# Patient Record
Sex: Female | Born: 1965 | Race: Black or African American | Hispanic: No | Marital: Single | Smoking: Never smoker
Health system: Southern US, Community
[De-identification: ages and names within clinical notes are randomized; demographics above are authoritative.]

## PROBLEM LIST (undated history)

## (undated) DIAGNOSIS — J45909 Unspecified asthma, uncomplicated: Secondary | ICD-10-CM

## (undated) DIAGNOSIS — I10 Essential (primary) hypertension: Secondary | ICD-10-CM

## (undated) DIAGNOSIS — D649 Anemia, unspecified: Secondary | ICD-10-CM

## (undated) HISTORY — DX: Anemia, unspecified: D64.9

## (undated) HISTORY — PX: NO PAST SURGERIES: SHX2092

## (undated) HISTORY — DX: Unspecified asthma, uncomplicated: J45.909

## (undated) HISTORY — PX: COLONOSCOPY: SHX174

---

## 2009-10-06 ENCOUNTER — Ambulatory Visit (HOSPITAL_COMMUNITY): Admission: RE | Admit: 2009-10-06 | Discharge: 2009-10-06 | Payer: Self-pay | Admitting: Internal Medicine

## 2010-11-07 ENCOUNTER — Encounter: Payer: Self-pay | Admitting: Internal Medicine

## 2010-12-24 ENCOUNTER — Other Ambulatory Visit (HOSPITAL_COMMUNITY): Payer: Self-pay | Admitting: Internal Medicine

## 2010-12-24 DIAGNOSIS — Z1231 Encounter for screening mammogram for malignant neoplasm of breast: Secondary | ICD-10-CM

## 2011-01-06 ENCOUNTER — Ambulatory Visit (HOSPITAL_COMMUNITY)
Admission: RE | Admit: 2011-01-06 | Discharge: 2011-01-06 | Disposition: A | Discharge status: Home / Self Care | Payer: Self-pay | Source: Ambulatory Visit | Attending: Internal Medicine | Admitting: Internal Medicine

## 2011-01-06 DIAGNOSIS — Z1231 Encounter for screening mammogram for malignant neoplasm of breast: Secondary | ICD-10-CM

## 2012-01-24 ENCOUNTER — Other Ambulatory Visit (HOSPITAL_COMMUNITY): Payer: Self-pay | Admitting: Internal Medicine

## 2012-01-24 DIAGNOSIS — Z1231 Encounter for screening mammogram for malignant neoplasm of breast: Secondary | ICD-10-CM

## 2012-02-17 ENCOUNTER — Ambulatory Visit (HOSPITAL_COMMUNITY)
Admission: RE | Admit: 2012-02-17 | Discharge: 2012-02-17 | Disposition: A | Discharge status: Home / Self Care | Payer: Self-pay | Source: Ambulatory Visit | Attending: Internal Medicine | Admitting: Internal Medicine

## 2012-02-17 DIAGNOSIS — Z1231 Encounter for screening mammogram for malignant neoplasm of breast: Secondary | ICD-10-CM

## 2013-08-06 ENCOUNTER — Ambulatory Visit (INDEPENDENT_AMBULATORY_CARE_PROVIDER_SITE_OTHER): Payer: Self-pay | Admitting: Family

## 2013-08-06 ENCOUNTER — Encounter: Payer: Self-pay | Admitting: Family

## 2013-08-06 VITALS — BP 158/98 | HR 76 | Temp 98.6°F | Resp 16 | Ht 64.5 in | Wt 311.1 lb

## 2013-08-06 DIAGNOSIS — Z6841 Body Mass Index (BMI) 40.0 and over, adult: Secondary | ICD-10-CM

## 2013-08-06 DIAGNOSIS — I10 Essential (primary) hypertension: Secondary | ICD-10-CM

## 2013-08-06 DIAGNOSIS — Z23 Encounter for immunization: Secondary | ICD-10-CM

## 2013-08-06 DIAGNOSIS — K5900 Constipation, unspecified: Secondary | ICD-10-CM

## 2013-08-06 MED ORDER — METOPROLOL TARTRATE 25 MG PO TABS: 25.0000 mg | ORAL_TABLET | Freq: Two times a day (BID) | ORAL | Status: DC

## 2013-08-06 NOTE — Assessment & Plan Note (Signed)
We discussed increasing fresh fruits veggies and water intake.

## 2013-08-06 NOTE — Patient Instructions (Signed)
Please continue your walking. Increase water, fresh fruits, veggies and lean meats in your diet. Start metoprolol. Follow up in 1 month for blood pressure check.  Welcome to [facility]!

## 2013-08-06 NOTE — Assessment & Plan Note (Signed)
We discussed importance of healthy diet, exercise and weight loss.

## 2013-08-06 NOTE — Assessment & Plan Note (Signed)
Deteriorated.  Add metoprolol. Follow up in 1 month.  Plan lab work next visit. Tdap given today along with flu shot.  Plan bmet next visit. She is hopeful that she will be able to obtain insurance in the near future. Form signed for work.

## 2013-08-06 NOTE — Progress Notes (Signed)
  Subjective:    Patient ID: Marissa Bowman, female    DOB: August 06, 1966, 47 y.o.   MRN: [id number]  HPI  [name] is a 46 yr old female who presents today to establish care.   Establish Care Pt new to establish care. Wants to discuss constipation and needs form completed for her job. Pt completed PPD at [facility]. Pt unsure of last tetanus, would like flu vaccine today. Last mammogram 2013,  HTN-  Reports that she does not take any medications.   Exercise- walks twice a week walks 2 hours a week. Immunizations- unknown.   Review of Systems  Constitutional: Negative for unexpected weight change.  Respiratory: Negative for cough.   Gastrointestinal: Positive for constipation. Negative for nausea, vomiting and diarrhea.  Genitourinary: Negative for menstrual problem.       Constipation  Musculoskeletal: Negative for arthralgias and myalgias.  Neurological: Negative for headaches.  Hematological: Negative for adenopathy.  Psychiatric/Behavioral:       Denies depression/anxiety   See HPI  Past Medical History  Diagnosis Date  . Asthma     childhood  . Elevated blood pressure reading without diagnosis of hypertension     History   Social History  . Marital Status: Single    Spouse Name: N/A    Number of Children: N/A  . Years of Education: N/A   Occupational History  . Not on file.   Social History Main Topics  . Smoking status: Never Smoker   . Smokeless tobacco: Never Used  . Alcohol Use: No  . Drug Use: Not on file  . Sexual Activity: Not on file   Other Topics Concern  . Not on file   Social History Narrative  . No narrative on file    History reviewed. No pertinent past surgical history.  Family History  Problem Relation Age of Onset  . Hypertension Mother   . Hyperlipidemia Mother   . Diabetes Mother     No Known Allergies  No current outpatient prescriptions on file prior to visit.   No current facility-administered medications on file  prior to visit.    BP 158/98  Pulse 76  Temp(Src) 98.6 F (37 C) (Oral)  Resp 16  Ht 5' 4.5" (1.638 m)  Wt 311 lb 1.3 oz (141.105 kg)  BMI 52.59 kg/m2  SpO2 99%  LMP 07/23/2013       Objective:   Physical Exam  Constitutional: She is oriented to person, place, and time.  Pleasant, morbidly obese AA female, awake, alert and in NAD.  HENT:  Head: Normocephalic and atraumatic.  Cardiovascular: Normal rate and regular rhythm.   No murmur heard. Pulmonary/Chest: Effort normal and breath sounds normal. No respiratory distress. She has no wheezes. She has no rales. She exhibits no tenderness.  Musculoskeletal: She exhibits no edema.  Neurological: She is alert and oriented to person, place, and time.  Psychiatric: She has a normal mood and affect. Her behavior is normal. Judgment and thought content normal.          Assessment & Plan:

## 2013-08-26 ENCOUNTER — Ambulatory Visit: Payer: Self-pay | Admitting: Family

## 2013-09-02 ENCOUNTER — Ambulatory Visit: Payer: Self-pay | Admitting: Family

## 2013-09-06 ENCOUNTER — Ambulatory Visit: Payer: Self-pay | Admitting: Family

## 2013-09-06 DIAGNOSIS — Z0289 Encounter for other administrative examinations: Secondary | ICD-10-CM

## 2013-12-23 ENCOUNTER — Other Ambulatory Visit: Payer: Self-pay | Admitting: Family

## 2013-12-23 ENCOUNTER — Telehealth: Payer: Self-pay | Admitting: Family

## 2013-12-23 MED ORDER — METOPROLOL TARTRATE 25 MG PO TABS: 25.0000 mg | ORAL_TABLET | Freq: Two times a day (BID) | ORAL | Status: DC

## 2013-12-23 NOTE — Telephone Encounter (Signed)
Informed patient of medication refill and she scheduled appointment for 01/06/14 °

## 2013-12-23 NOTE — Telephone Encounter (Signed)
Refill metoprolol

## 2013-12-23 NOTE — Telephone Encounter (Signed)
Pt was last seen in November and advised 1 month follow up. Gave 2 week supply of metoprolol but pt will need to be seen before further refills can be given. Please call pt to arrange appt.

## 2014-01-06 ENCOUNTER — Ambulatory Visit: Payer: Self-pay | Admitting: Family

## 2014-01-27 ENCOUNTER — Ambulatory Visit: Payer: Self-pay | Admitting: Family

## 2014-02-10 ENCOUNTER — Ambulatory Visit: Payer: Self-pay | Admitting: Family

## 2014-02-17 ENCOUNTER — Ambulatory Visit (INDEPENDENT_AMBULATORY_CARE_PROVIDER_SITE_OTHER): Payer: Self-pay | Admitting: Family

## 2014-02-17 ENCOUNTER — Telehealth: Payer: Self-pay | Admitting: Family

## 2014-02-17 ENCOUNTER — Encounter: Payer: Self-pay | Admitting: Family

## 2014-02-17 VITALS — BP 158/96 | HR 78 | Temp 97.8°F | Resp 16 | Ht 64.5 in | Wt 322.0 lb

## 2014-02-17 DIAGNOSIS — T148XXA Other injury of unspecified body region, initial encounter: Principal | ICD-10-CM

## 2014-02-17 DIAGNOSIS — L0291 Cutaneous abscess, unspecified: Secondary | ICD-10-CM | POA: Insufficient documentation

## 2014-02-17 DIAGNOSIS — L089 Local infection of the skin and subcutaneous tissue, unspecified: Secondary | ICD-10-CM

## 2014-02-17 DIAGNOSIS — I10 Essential (primary) hypertension: Secondary | ICD-10-CM

## 2014-02-17 MED ORDER — METOPROLOL TARTRATE 25 MG PO TABS: 25.0000 mg | ORAL_TABLET | Freq: Two times a day (BID) | ORAL | Status: DC

## 2014-02-17 MED ORDER — CEPHALEXIN 500 MG PO CAPS: 500.0000 mg | ORAL_CAPSULE | Freq: Two times a day (BID) | ORAL | Status: DC

## 2014-02-17 NOTE — Patient Instructions (Signed)
Restart metoprolol. Start keflex for wound. Follow up in 1 week.

## 2014-02-17 NOTE — Progress Notes (Signed)
Pre visit review using our clinic review tool, if applicable. No additional management support is needed unless otherwise documented below in the visit note. 

## 2014-02-17 NOTE — Progress Notes (Signed)
   Subjective:    Patient ID: Marissa Bowman, female    DOB: 04/26/1966, 48 y.o.   MRN: [id number]  HPI  Marissa Bowman is a 48 yr old female who presents today for follow up.  1) HTN- Pt was seen once in November and noted to be hypertensive.  Metoprolol was added to her regimen. She did not return for follow up and has been out of medication for 1 month. Denies CP/SOB or swelling BP Readings from Last 3 Encounters:  02/17/14 158/96  08/06/13 158/98    2) Fall- slid in the mud 1 week ago.  Side landed in the mud. Reports wound on the left flank. Has been applying lidoderm patches to the area that she had on hand.    Review of Systems See HPI  Past Medical History  Diagnosis Date  . Asthma     childhood  . Elevated blood pressure reading without diagnosis of hypertension     History   Social History  . Marital Status: Single    Spouse Name: N/A    Number of Children: N/A  . Years of Education: N/A   Occupational History  . Not on file.   Social History Main Topics  . Smoking status: Never Smoker   . Smokeless tobacco: Never Used  . Alcohol Use: No  . Drug Use: Not on file  . Sexual Activity: Not on file   Other Topics Concern  . Not on file   Social History Narrative   1 son- age 67   Unemployed, plans to sub with [employer]   Associated degree   Lives with mother father/father and son   Has 1 dog lab/spaniel mix   Enjoys reading    No past surgical history on file.  Family History  Problem Relation Age of Onset  . Hypertension Mother   . Hyperlipidemia Mother   . Diabetes Mother     No Known Allergies  Current Outpatient Prescriptions on File Prior to Visit  Medication Sig Dispense Refill  . fish oil-omega-3 fatty acids 1000 MG capsule Take 1,200 mg by mouth every morning.       No current facility-administered medications on file prior to visit.    BP 158/96  Pulse 78  Temp(Src) 97.8 F (36.6 C) (Oral)  Resp 16  Ht 5' 4.5" (1.638 m)  Wt  322 lb (146.058 kg)  BMI 54.44 kg/m2  SpO2 97%  LMP 02/13/2014       Objective:   Physical Exam  Constitutional: She is oriented to person, place, and time. She appears well-developed and well-nourished. No distress.  Cardiovascular: Normal rate and regular rhythm.   No murmur heard. Pulmonary/Chest: Effort normal and breath sounds normal. No respiratory distress. She has no wheezes. She has no rales. She exhibits no tenderness.  Neurological: She is alert and oriented to person, place, and time.  Skin:  approx 1 cm wide skin ulcer noted left flank, with some mild surrounding erythema/swelling.   Psychiatric: She has a normal mood and affect. Her behavior is normal. Judgment and thought content normal.          Assessment & Plan:

## 2014-02-17 NOTE — Telephone Encounter (Signed)
Relevant patient education assigned to patient using Emmi. ° °

## 2014-02-17 NOTE — Assessment & Plan Note (Signed)
Mild skin infection noted. Add keflex.

## 2014-02-17 NOTE — Assessment & Plan Note (Signed)
Deteriorated. Resume metoprolol.  Follow up in 1 week for BP check.

## 2014-02-24 ENCOUNTER — Encounter: Payer: Self-pay | Admitting: Family

## 2014-02-24 ENCOUNTER — Ambulatory Visit (INDEPENDENT_AMBULATORY_CARE_PROVIDER_SITE_OTHER): Payer: Self-pay | Admitting: Family

## 2014-02-24 VITALS — BP 150/86 | HR 55 | Temp 98.6°F | Resp 16 | Ht 64.5 in | Wt 327.1 lb

## 2014-02-24 DIAGNOSIS — I10 Essential (primary) hypertension: Secondary | ICD-10-CM

## 2014-02-24 DIAGNOSIS — L0291 Cutaneous abscess, unspecified: Secondary | ICD-10-CM

## 2014-02-24 DIAGNOSIS — L0390 Cellulitis, unspecified: Secondary | ICD-10-CM

## 2014-02-24 MED ORDER — AMLODIPINE BESYLATE 5 MG PO TABS: 5.0000 mg | ORAL_TABLET | Freq: Every day | ORAL | Status: DC

## 2014-02-24 MED ORDER — AMLODIPINE BESYLATE 10 MG PO TABS: 10.0000 mg | ORAL_TABLET | Freq: Every day | ORAL | Status: DC

## 2014-02-24 MED ORDER — DOXYCYCLINE HYCLATE 100 MG PO TABS: 100.0000 mg | ORAL_TABLET | Freq: Two times a day (BID) | ORAL | Status: DC

## 2014-02-24 NOTE — Progress Notes (Signed)
Pre visit review using our clinic review tool, if applicable. No additional management support is needed unless otherwise documented below in the visit note. 

## 2014-02-24 NOTE — Patient Instructions (Signed)
Start amlodipine for your blood pressure. Stop keflex, start doxycycline. Follow up in 1 week.

## 2014-02-24 NOTE — Progress Notes (Addendum)
   Subjective:    Patient ID: Marissa Bowman, female    DOB: [birth year], 48 y.o.   MRN: [id number]  HPI  Marissa Bowman is a 48 yr old female who presents today for follow up of her HTN. BP Readings from Last 3 Encounters:  02/24/14 150/86  02/17/14 158/96  08/06/13 158/98  She reports + compliance with metoprolol.    Back- reports- improved with keflex.  Review of Systems    see HPI  Past Medical History  Diagnosis Date  . Asthma     childhood  . Elevated blood pressure reading without diagnosis of hypertension     History   Social History  . Marital Status: Single    Spouse Name: N/A    Number of Children: N/A  . Years of Education: N/A   Occupational History  . Not on file.   Social History Main Topics  . Smoking status: Never Smoker   . Smokeless tobacco: Never Used  . Alcohol Use: No  . Drug Use: Not on file  . Sexual Activity: Not on file   Other Topics Concern  . Not on file   Social History Narrative   1 son- age 20   Unemployed, plans to sub with the [employer]   Associated degree   Lives with mother father/father and son   Has 1 dog lab/spaniel mix   Enjoys reading    No past surgical history on file.  Family History  Problem Relation Age of Onset  . Hypertension Mother   . Hyperlipidemia Mother   . Diabetes Mother     No Known Allergies  Current Outpatient Prescriptions on File Prior to Visit  Medication Sig Dispense Refill  . cephALEXin (KEFLEX) 500 MG capsule Take 1 capsule (500 mg total) by mouth 2 (two) times daily.  14 capsule  0  . fish oil-omega-3 fatty acids 1000 MG capsule Take 1,200 mg by mouth every morning.      . metoprolol tartrate (LOPRESSOR) 25 MG tablet Take 1 tablet (25 mg total) by mouth 2 (two) times daily.  30 tablet  0   No current facility-administered medications on file prior to visit.    BP 150/86  Pulse 55  Temp(Src) 98.6 F (37 C) (Oral)  Resp 16  Ht 5' 4.5" (1.638 m)  Wt 327 lb 1.3 oz (148.363 kg)   BMI 55.30 kg/m2  SpO2 99%  LMP 02/13/2014    Objective:   Physical Exam  Constitutional: She appears well-developed and well-nourished. No distress.  HENT:  Head: Normocephalic and atraumatic.  Cardiovascular: Normal rate and regular rhythm.   No murmur heard. Pulmonary/Chest: Effort normal and breath sounds normal. No respiratory distress. She has no wheezes. She has no rales. She exhibits no tenderness.  Skin: Skin is warm and dry.  Abscess left lateral back appears worse with approximately 2 inch wide area of induration with central fluctuance.   Psychiatric: She has a normal mood and affect. Her behavior is normal. Judgment and thought content normal.          Assessment & Plan:

## 2014-02-26 NOTE — Assessment & Plan Note (Signed)
Deteriorated. Add amlodipine, continue lopressor.

## 2014-02-26 NOTE — Assessment & Plan Note (Signed)
Procedure including risks/benefits explained to patient.  Questions were answered. After informed consent was obtainedsite was cleansed with betadine and then alcohol. 1% Lidocaine with epinephrine was infiltrated into abscess and then I and D  was performed.  Large amount of solid material was expressed. Wound was packed with iodoform gauze.  Pt tolerated procedure well.  D/C keflex, start doxy, follow up in 1 week, sooner if problems/concerns.

## 2014-03-03 ENCOUNTER — Ambulatory Visit (INDEPENDENT_AMBULATORY_CARE_PROVIDER_SITE_OTHER): Payer: Self-pay | Admitting: Family

## 2014-03-03 ENCOUNTER — Encounter: Payer: Self-pay | Admitting: Family

## 2014-03-03 VITALS — BP 128/82 | HR 73 | Temp 98.0°F | Resp 16 | Ht 64.5 in | Wt 322.0 lb

## 2014-03-03 DIAGNOSIS — L0390 Cellulitis, unspecified: Secondary | ICD-10-CM

## 2014-03-03 DIAGNOSIS — L0291 Cutaneous abscess, unspecified: Secondary | ICD-10-CM

## 2014-03-03 NOTE — Progress Notes (Signed)
Pre visit review using our clinic review tool, if applicable. No additional management support is needed unless otherwise documented below in the visit note. 

## 2014-03-03 NOTE — Progress Notes (Signed)
   Subjective:    Patient ID: Marissa Bowman, female    DOB: Feb 04, 1966, 48 y.o.   MRN: [id number]  HPI  Ms. Orzel presents today for a 1 week follow up of the abscess left back, s/p I and D. She could not afford the doxycycline.  Notes area remains sore.    Review of Systems    see HPI  Past Medical History  Diagnosis Date  . Asthma     childhood  . Elevated blood pressure reading without diagnosis of hypertension     History   Social History  . Marital Status: Single    Spouse Name: N/A    Number of Children: N/A  . Years of Education: N/A   Occupational History  . Not on file.   Social History Main Topics  . Smoking status: Never Smoker   . Smokeless tobacco: Never Used  . Alcohol Use: No  . Drug Use: Not on file  . Sexual Activity: Not on file   Other Topics Concern  . Not on file   Social History Narrative   1 son- age 47   Unemployed, plans to sub with the [employer]   Associated degree   Lives with mother father/father and son   Has 1 dog lab/spaniel mix   Enjoys reading    No past surgical history on file.  Family History  Problem Relation Age of Onset  . Hypertension Mother   . Hyperlipidemia Mother   . Diabetes Mother     No Known Allergies  Current Outpatient Prescriptions on File Prior to Visit  Medication Sig Dispense Refill  . amLODipine (NORVASC) 5 MG tablet Take 1 tablet (5 mg total) by mouth daily.  30 tablet  0  . fish oil-omega-3 fatty acids 1000 MG capsule Take 1,200 mg by mouth every morning.      . metoprolol tartrate (LOPRESSOR) 25 MG tablet Take 1 tablet (25 mg total) by mouth 2 (two) times daily.  30 tablet  0   No current facility-administered medications on file prior to visit.    BP 128/82  Pulse 73  Temp(Src) 98 F (36.7 C) (Oral)  Resp 16  Ht 5' 4.5" (1.638 m)  Wt 322 lb (146.058 kg)  BMI 54.44 kg/m2  SpO2 97%  LMP 02/13/2014    Objective:   Physical Exam  Constitutional: She appears well-developed and  well-nourished. No distress.  Skin:  approx 1 cm wide area of tender induration noted- no fluctuance, no erythema- no drainage. Closed.          Assessment & Plan:

## 2014-03-03 NOTE — Assessment & Plan Note (Signed)
Nearly resolved. OK to remain off abx. I have advised pt to keep close eye on the area and call if it does not continue to resolve completely.

## 2014-03-03 NOTE — Patient Instructions (Addendum)
Call if area becomes sore,swollen red or if you develop drainage. Symptoms should continue to improve. Keep up the good work with diet and exercise. Follow up in 4 months.

## 2014-03-04 ENCOUNTER — Encounter: Payer: Self-pay | Admitting: Family

## 2014-03-05 ENCOUNTER — Telehealth: Payer: Self-pay | Admitting: Family

## 2014-03-05 DIAGNOSIS — E669 Obesity, unspecified: Secondary | ICD-10-CM

## 2014-03-05 NOTE — Telephone Encounter (Signed)
See order.

## 2014-03-19 ENCOUNTER — Encounter: Payer: Self-pay | Admitting: Family

## 2014-04-01 ENCOUNTER — Ambulatory Visit: Payer: Self-pay | Admitting: Dietician

## 2014-07-07 ENCOUNTER — Ambulatory Visit (INDEPENDENT_AMBULATORY_CARE_PROVIDER_SITE_OTHER): Payer: Self-pay | Admitting: Family

## 2014-07-07 ENCOUNTER — Encounter: Payer: Self-pay | Admitting: Family

## 2014-07-07 VITALS — BP 126/93 | HR 78 | Temp 98.6°F | Resp 16 | Ht 64.5 in | Wt 320.1 lb

## 2014-07-07 DIAGNOSIS — Z23 Encounter for immunization: Secondary | ICD-10-CM

## 2014-07-07 DIAGNOSIS — I10 Essential (primary) hypertension: Secondary | ICD-10-CM

## 2014-07-07 DIAGNOSIS — Z6841 Body Mass Index (BMI) 40.0 and over, adult: Secondary | ICD-10-CM

## 2014-07-07 MED ORDER — METOPROLOL TARTRATE 25 MG PO TABS: ORAL_TABLET | ORAL | Status: DC

## 2014-07-07 NOTE — Addendum Note (Signed)
Addended by: Rockwell Germany on: 07/07/2014 03:19 PM   Modules accepted: Orders

## 2014-07-07 NOTE — Progress Notes (Signed)
   Subjective:    Patient ID: Marissa Bowman, female    DOB: April 01, 1966, 48 y.o.   MRN: [id number]  HPI  Diet- reports that she does not drink sodas, no bread, trying to eat diabetic diet.  Not walking currently.  Wt Readings from Last 3 Encounters:  07/07/14 320 lb 2 oz (145.208 kg)  03/03/14 322 lb (146.058 kg)  02/24/14 327 lb 1.3 oz (148.363 kg)   HTN- she has not taken metoprolol or amlodipine in July. BP Readings from Last 3 Encounters:  07/07/14 126/93  03/03/14 128/82  02/24/14 150/86      Review of Systems    see HPI  BP Readings from Last 3 Encounters:  07/07/14 126/93  03/03/14 128/82  02/24/14 150/86    Objective:   Physical Exam  Constitutional: She is oriented to person, place, and time. She appears well-developed and well-nourished. No distress.  Cardiovascular: Normal rate and regular rhythm.   No murmur heard. Pulmonary/Chest: Effort normal and breath sounds normal. No respiratory distress. She has no wheezes. She has no rales. She exhibits no tenderness.  Musculoskeletal: She exhibits no edema.  Neurological: She is alert and oriented to person, place, and time.  Psychiatric: She has a normal mood and affect. Her behavior is normal. Judgment and thought content normal.          Assessment & Plan:

## 2014-07-07 NOTE — Assessment & Plan Note (Addendum)
DBP mildly elevated.  Restart metoprolol. Was only taking once daily and stopped over the summer. Resume 25 mg 1/2 tab twice daily. Obtain BMET.

## 2014-07-07 NOTE — Assessment & Plan Note (Signed)
We discussed healthy diet, weight loss, counting calories using [employer].

## 2014-07-07 NOTE — Progress Notes (Signed)
Pre visit review using our clinic review tool, if applicable. No additional management support is needed unless otherwise documented below in the visit note/SLS  

## 2014-07-07 NOTE — Patient Instructions (Addendum)
Set up an account at [employer]- with goal weight loss of 1-2 pounds/day. Complete lab work prior to leaving. Schedule a fasting physical in 1 month.

## 2014-08-08 ENCOUNTER — Encounter: Payer: Self-pay | Admitting: Family

## 2014-08-08 ENCOUNTER — Ambulatory Visit (INDEPENDENT_AMBULATORY_CARE_PROVIDER_SITE_OTHER): Payer: Self-pay | Admitting: Family

## 2014-08-08 ENCOUNTER — Other Ambulatory Visit (HOSPITAL_COMMUNITY)
Admission: RE | Admit: 2014-08-08 | Discharge: 2014-08-08 | Disposition: A | Discharge status: Home / Self Care | Payer: Self-pay | Source: Ambulatory Visit | Attending: Family | Admitting: Family

## 2014-08-08 ENCOUNTER — Other Ambulatory Visit: Payer: Self-pay | Admitting: Family

## 2014-08-08 VITALS — BP 130/88 | HR 56 | Temp 97.7°F | Resp 16 | Wt 321.2 lb

## 2014-08-08 DIAGNOSIS — Z01419 Encounter for gynecological examination (general) (routine) without abnormal findings: Secondary | ICD-10-CM | POA: Insufficient documentation

## 2014-08-08 DIAGNOSIS — Z0000 Encounter for general adult medical examination without abnormal findings: Secondary | ICD-10-CM

## 2014-08-08 LAB — CBC WITH DIFFERENTIAL/PLATELET
Basophils Absolute: 0 10*3/uL (ref 0.0–0.1)
Basophils Relative: 0 % (ref 0–1)
EOS ABS: 0.2 10*3/uL (ref 0.0–0.7)
Eosinophils Relative: 3 % (ref 0–5)
HCT: 30.8 % — ABNORMAL LOW (ref 36.0–46.0)
HEMOGLOBIN: 10.1 g/dL — AB (ref 12.0–15.0)
LYMPHS ABS: 1.5 10*3/uL (ref 0.7–4.0)
LYMPHS PCT: 21 % (ref 12–46)
MCH: 23.9 pg — AB (ref 26.0–34.0)
MCHC: 32.8 g/dL (ref 30.0–36.0)
MCV: 73 fL — ABNORMAL LOW (ref 78.0–100.0)
MONOS PCT: 6 % (ref 3–12)
Monocytes Absolute: 0.4 10*3/uL (ref 0.1–1.0)
NEUTROS PCT: 70 % (ref 43–77)
Neutro Abs: 4.9 10*3/uL (ref 1.7–7.7)
Platelets: 357 10*3/uL (ref 150–400)
RBC: 4.22 MIL/uL (ref 3.87–5.11)
RDW: 16.1 % — ABNORMAL HIGH (ref 11.5–15.5)
WBC: 7 10*3/uL (ref 4.0–10.5)

## 2014-08-08 LAB — HEPATIC FUNCTION PANEL
ALT: 12 U/L (ref 0–35)
AST: 14 U/L (ref 0–37)
Albumin: 3.9 g/dL (ref 3.5–5.2)
Alkaline Phosphatase: 63 U/L (ref 39–117)
BILIRUBIN DIRECT: 0.1 mg/dL (ref 0.0–0.3)
BILIRUBIN INDIRECT: 0.3 mg/dL (ref 0.2–1.2)
TOTAL PROTEIN: 6.7 g/dL (ref 6.0–8.3)
Total Bilirubin: 0.4 mg/dL (ref 0.2–1.2)

## 2014-08-08 LAB — BASIC METABOLIC PANEL
BUN: 7 mg/dL (ref 6–23)
CALCIUM: 8.7 mg/dL (ref 8.4–10.5)
CHLORIDE: 104 meq/L (ref 96–112)
CO2: 27 meq/L (ref 19–32)
Creat: 0.63 mg/dL (ref 0.50–1.10)
GLUCOSE: 77 mg/dL (ref 70–99)
POTASSIUM: 3.6 meq/L (ref 3.5–5.3)
SODIUM: 141 meq/L (ref 135–145)

## 2014-08-08 LAB — LIPID PANEL
Cholesterol: 191 mg/dL (ref 0–200)
HDL: 59 mg/dL (ref >39)
LDL CALC: 117 mg/dL — AB (ref 0–99)
Total CHOL/HDL Ratio: 3.2 Ratio
Triglycerides: 74 mg/dL (ref <150)
VLDL: 15 mg/dL (ref 0–40)

## 2014-08-08 LAB — TSH: TSH: 1.813 u[IU]/mL (ref 0.350–4.500)

## 2014-08-08 NOTE — Addendum Note (Signed)
Addended by: Peggyann Shoals on: 08/08/2014 03:04 PM   Modules accepted: Orders

## 2014-08-08 NOTE — Assessment & Plan Note (Signed)
Discussed healthy diet, exercise, weight loss. Pap performed today, obtain fasting labs, refer for mammogram, colo, immunizations up to date.

## 2014-08-08 NOTE — Patient Instructions (Signed)
Please complete lab work prior to leaving. You will be contacted about your Mammogram and colonoscopy- let me know if you have not heard back in 1 week about these tests. Try to get regular walking in. Keep working hard on diet and weight loss. Follow up in 4 months for blood pressure recheck.

## 2014-08-08 NOTE — Progress Notes (Signed)
Subjective:    Patient ID: Marissa Bowman, female    DOB: 1966-03-04, 48 y.o.   MRN: [id number]  HPI  Marissa Bowman is a 48 yr old female  Patient presents today for complete physical.  Immunizations: up to date Diet: using [employer]itness [employer]   Wt Readings from Last 3 Encounters:  08/08/14 321 lb 3.2 oz (145.695 kg)  07/07/14 320 lb 2 oz (145.208 kg)  03/03/14 322 lb (146.058 kg)  Exercise: no, but enjoys walking wants to start Colonoscopy: due  Pap Smear:due Mammogram: due LMP 10/19, no hot flashes   Review of Systems  Constitutional: Negative for unexpected weight change.  HENT: Negative for hearing loss and rhinorrhea.   Eyes:       Wears reading glasses  Respiratory: Negative for cough.   Cardiovascular: Negative for chest pain.  Gastrointestinal: Negative for diarrhea.  Genitourinary: Negative for dysuria.  Musculoskeletal:       Some low back pain when working  Skin: Negative for rash.  Neurological: Negative for headaches.  Hematological: Negative for adenopathy.  Psychiatric/Behavioral:       Denies depression/anxiety   Past Medical History  Diagnosis Date  . Asthma     childhood  . Elevated blood pressure reading without diagnosis of hypertension     History   Social History  . Marital Status: Single    Spouse Name: N/A    Number of Children: N/A  . Years of Education: N/A   Occupational History  . Not on file.   Social History Main Topics  . Smoking status: Never Smoker   . Smokeless tobacco: Never Used  . Alcohol Use: No  . Drug Use: Not on file  . Sexual Activity: Not on file   Other Topics Concern  . Not on file   Social History Narrative   1 son- age 2   Unemployed, plans to sub with the  schools   Associated degree   Lives with mother father/father and son   Has 1 dog lab/spaniel mix   Enjoys reading    No past surgical history on file.  Family History  Problem Relation Age of Onset  . Hypertension Mother   . Hyperlipidemia  Mother   . Diabetes Mother     No Known Allergies  Current Outpatient Prescriptions on File Prior to Visit  Medication Sig Dispense Refill  . fish oil-omega-3 fatty acids 1000 MG capsule Take 1,200 mg by mouth every morning.      . metoprolol tartrate (LOPRESSOR) 25 MG tablet 1/2 tab by mouth twice daily  30 tablet  2   No current facility-administered medications on file prior to visit.    BP 130/88  Pulse 56  Temp(Src) 97.7 F (36.5 C) (Oral)  Resp 16  Wt 321 lb 3.2 oz (145.695 kg)  SpO2 99%  LMP 08/04/2014       Objective:   Physical Exam Physical Exam  Constitutional: She is oriented to person, place, and time. She appears well-developed and well-nourished. No distress.  HENT:  Head: Normocephalic and atraumatic.  Right Ear: Tympanic membrane and ear canal normal.  Left Ear: Tympanic membrane and ear canal normal.  Mouth/Throat: Oropharynx is clear and moist.  Eyes: Pupils are equal, round, and reactive to light. No scleral icterus.  Neck: Normal range of motion. No thyromegaly present.  Cardiovascular: Normal rate and regular rhythm.   No murmur heard. Pulmonary/Chest: Effort normal and breath sounds normal. No respiratory distress. He has no wheezes. She  has no rales. She exhibits no tenderness.  Abdominal: Soft. Bowel sounds are normal. He exhibits no distension and no mass. There is no tenderness. There is no rebound and no guarding.  Musculoskeletal: She exhibits no edema.  Lymphadenopathy:    She has no cervical adenopathy.  Neurological: She is alert and oriented to person, place, and time. She exhibits normal muscle tone. Coordination normal.  Skin: Skin is warm and dry.  Psychiatric: She has a normal mood and affect. Her behavior is normal. Judgment and thought content normal.  Breasts: Examined lying Right: Without masses, retractions, discharge or axillary adenopathy.  Left: Without masses, retractions, discharge or axillary adenopathy.  Inguinal/mons:  Normal without inguinal adenopathy  External genitalia: Normal  BUS/Urethra/Skene's glands: Normal  Bladder: Normal  Vagina: Normal- blood noted Cervix: Normal  Uterus: normal in size, shape and contour. Midline and mobile  Adnexa/parametria:  Rt: Without masses or tenderness.  Lt: Without masses or tenderness.  Anus and perineum: Normal           Assessment & Plan:         Assessment & Plan:

## 2014-08-08 NOTE — Progress Notes (Signed)
Pre visit review using our clinic review tool, if applicable. No additional management support is needed unless otherwise documented below in the visit note. 

## 2014-08-08 NOTE — Addendum Note (Signed)
Addended by: Kelle Darting A on: 08/08/2014 03:12 PM   Modules accepted: Orders

## 2014-08-11 LAB — URINALYSIS, ROUTINE W REFLEX MICROSCOPIC

## 2014-08-12 LAB — CYTOLOGY - PAP

## 2014-08-13 ENCOUNTER — Telehealth: Payer: Self-pay | Admitting: *Deleted

## 2014-08-13 LAB — IRON: Iron: 33 ug/dL — ABNORMAL LOW (ref 42–145)

## 2014-08-13 NOTE — Telephone Encounter (Signed)
Called and had serum iron added to blood drawn on 08/08/14 with Corwin @ [facility].//AB/CMA

## 2014-08-13 NOTE — Telephone Encounter (Signed)
Message copied by Harl Bowie on Wed Aug 13, 2014 11:35 AM ------      Message from: Ronny Flurry      Created: Tue Aug 12, 2014  3:18 PM      Regarding: ADD On Lab       Notes Recorded by [name], NP on 08/12/2014 at 1:07 PM            Can we see if lab can add on serum iron dx anemia please? ------

## 2014-08-14 ENCOUNTER — Telehealth: Payer: Self-pay | Admitting: Family

## 2014-08-14 DIAGNOSIS — D509 Iron deficiency anemia, unspecified: Secondary | ICD-10-CM

## 2014-08-14 NOTE — Telephone Encounter (Signed)
lmovm to return call  °

## 2014-08-14 NOTE — Telephone Encounter (Signed)
Pap is negative and blood work shows iron deficiency anemia. Add iron 325mg  twice daily.  Please ask pt to complete IFOB dx anemia. Pap is normal. Other labs look good.

## 2014-08-15 NOTE — Telephone Encounter (Signed)
Left message on home # to return my call. 

## 2014-08-19 ENCOUNTER — Ambulatory Visit (HOSPITAL_BASED_OUTPATIENT_CLINIC_OR_DEPARTMENT_OTHER): Payer: Self-pay

## 2014-08-20 NOTE — Telephone Encounter (Signed)
Left message on home # to return my call. 

## 2014-08-21 NOTE — Telephone Encounter (Signed)
Patient returned phone call. Informed her of what Melissa states. Patient will pickup IFOB kit tomorrow

## 2014-08-22 NOTE — Telephone Encounter (Signed)
Kit was already placed at front desk for pt to pick up, future order entered.

## 2014-08-26 ENCOUNTER — Ambulatory Visit (HOSPITAL_BASED_OUTPATIENT_CLINIC_OR_DEPARTMENT_OTHER)
Admission: RE | Admit: 2014-08-26 | Discharge: 2014-08-26 | Disposition: A | Discharge status: Home / Self Care | Payer: Self-pay | Source: Ambulatory Visit | Attending: Family | Admitting: Family

## 2014-08-26 DIAGNOSIS — Z1231 Encounter for screening mammogram for malignant neoplasm of breast: Secondary | ICD-10-CM | POA: Insufficient documentation

## 2014-08-26 DIAGNOSIS — Z0000 Encounter for general adult medical examination without abnormal findings: Secondary | ICD-10-CM

## 2014-09-21 ENCOUNTER — Encounter (HOSPITAL_BASED_OUTPATIENT_CLINIC_OR_DEPARTMENT_OTHER): Payer: Self-pay

## 2014-09-21 ENCOUNTER — Emergency Department (HOSPITAL_BASED_OUTPATIENT_CLINIC_OR_DEPARTMENT_OTHER)
Admission: EM | Admit: 2014-09-21 | Discharge: 2014-09-21 | Disposition: A | Discharge status: Home / Self Care | Payer: Self-pay | Attending: Emergency Medicine | Admitting: Emergency Medicine

## 2014-09-21 DIAGNOSIS — M25532 Pain in left wrist: Secondary | ICD-10-CM

## 2014-09-21 DIAGNOSIS — S56912A Strain of unspecified muscles, fascia and tendons at forearm level, left arm, initial encounter: Secondary | ICD-10-CM | POA: Insufficient documentation

## 2014-09-21 DIAGNOSIS — E669 Obesity, unspecified: Secondary | ICD-10-CM | POA: Insufficient documentation

## 2014-09-21 DIAGNOSIS — Z79899 Other long term (current) drug therapy: Secondary | ICD-10-CM | POA: Insufficient documentation

## 2014-09-21 DIAGNOSIS — Y9289 Other specified places as the place of occurrence of the external cause: Secondary | ICD-10-CM | POA: Insufficient documentation

## 2014-09-21 DIAGNOSIS — Y998 Other external cause status: Secondary | ICD-10-CM | POA: Insufficient documentation

## 2014-09-21 DIAGNOSIS — J45909 Unspecified asthma, uncomplicated: Secondary | ICD-10-CM | POA: Insufficient documentation

## 2014-09-21 DIAGNOSIS — T148XXA Other injury of unspecified body region, initial encounter: Secondary | ICD-10-CM

## 2014-09-21 DIAGNOSIS — X58XXXA Exposure to other specified factors, initial encounter: Secondary | ICD-10-CM | POA: Insufficient documentation

## 2014-09-21 DIAGNOSIS — Y9389 Activity, other specified: Secondary | ICD-10-CM | POA: Insufficient documentation

## 2014-09-21 MED ORDER — KETOROLAC TROMETHAMINE 60 MG/2ML IM SOLN
60.0000 mg | Freq: Once | INTRAMUSCULAR | Status: AC
  Administered 2014-09-21: 60 mg via INTRAMUSCULAR
  Filled 2014-09-21: qty 2

## 2014-09-21 MED ORDER — NAPROXEN 500 MG PO TABS: 500.0000 mg | ORAL_TABLET | Freq: Two times a day (BID) | ORAL | Status: DC | PRN

## 2014-09-21 MED ORDER — ACETAMINOPHEN 325 MG PO TABS
650.0000 mg | ORAL_TABLET | Freq: Once | ORAL | Status: AC
  Administered 2014-09-21: 650 mg via ORAL
  Filled 2014-09-21: qty 2

## 2014-09-21 NOTE — ED Notes (Signed)
Ice pack applied.

## 2014-09-21 NOTE — ED Notes (Signed)
Pt discharged to home with family. NAD.  

## 2014-09-21 NOTE — ED Provider Notes (Signed)
CSN: [id number]     Arrival date & time 09/21/14  6195 History   First MD Initiated Contact with Patient 09/21/14 1020     Chief Complaint  Patient presents with  . Arm Pain     (Consider location/radiation/quality/duration/timing/severity/associated sxs/prior Treatment) HPI Comments: 48 rolled female history of high blood pressure, obesity presents with left forearm pain since Thursday. Patient has been lifting regularly/heavier the past few days with her arms. She's had discomfort with palpation and extension of forearm. No direct injuries. No history of similar.  Patient is a 48 y.o. female presenting with arm pain. The history is provided by the patient.  Arm Pain    Past Medical History  Diagnosis Date  . Asthma     childhood  . Elevated blood pressure reading without diagnosis of hypertension    History reviewed. No pertinent past surgical history. Family History  Problem Relation Age of Onset  . Hypertension Mother   . Hyperlipidemia Mother   . Diabetes Mother    History  Substance Use Topics  . Smoking status: Never Smoker   . Smokeless tobacco: Never Used  . Alcohol Use: No   OB History    No data available     Review of Systems  Constitutional: Negative for fever.  Musculoskeletal: Negative for joint swelling.  Skin: Negative for rash.      Allergies  Review of patient's allergies indicates no known allergies.  Home Medications   Prior to Admission medications   Medication Sig Start Date End Date Taking? Authorizing Provider  ferrous sulfate 325 (65 FE) MG tablet Take 325 mg by mouth 2 (two) times daily with a meal.    Historical Provider, MD  fish oil-omega-3 fatty acids 1000 MG capsule Take 1,200 mg by mouth every morning.    Historical Provider, MD  metoprolol tartrate (LOPRESSOR) 25 MG tablet 1/2 tab by mouth twice daily 07/07/14   [name], NP  naproxen (NAPROSYN) 500 MG tablet Take 1 tablet (500 mg total) by mouth 2 (two) times daily  as needed. 09/21/14   Mariea Clonts, MD   BP 167/75 mmHg  Pulse 63  Temp(Src) 98.4 F (36.9 C) (Oral)  Resp 20  Wt 320 lb (145.151 kg)  SpO2 100% Physical Exam  Constitutional: She appears well-developed and well-nourished. No distress.  Musculoskeletal: Normal range of motion. She exhibits tenderness. She exhibits no edema.  Patient has tenderness to palpation dorsal left proximal forearm, most significant tenderness is at muscle attachment lateral epicondyles. Full range of motion with mild discomfort. Compartments soft, neurovascular intact and no sign of cellulitis.  Neurological: She is alert.  Skin: Skin is warm.  Nursing note and vitals reviewed.   ED Course  Procedures (including critical care time) Labs Review Labs Reviewed - No data to display  Imaging Review No results found.   EKG Interpretation None      MDM   Final diagnoses:  Forearm joint pain, left  Muscle strain   Clinically tendinopathy/muscle strain/epicondylitis. No signs of infection. No x-ray indicated. NSAIDs ice and outpatient follow-up  Results and differential diagnosis were discussed with the patient/parent/guardian. Close follow up outpatient was discussed, comfortable with the plan.   Medications  ketorolac (TORADOL) injection 60 mg (60 mg Intramuscular Given 09/21/14 1106)  acetaminophen (TYLENOL) tablet 650 mg (650 mg Oral Given 09/21/14 1106)    Filed Vitals:   09/21/14 1003  BP: 167/75  Pulse: 63  Temp: 98.4 F (36.9 C)  TempSrc: Oral  Resp:  20  Weight: 320 lb (145.151 kg)  SpO2: 100%    Final diagnoses:  Forearm joint pain, left  Muscle strain        Mariea Clonts, MD 09/21/14 1130

## 2014-09-21 NOTE — ED Notes (Signed)
Patient here with left arm pain and swelling from elbow down since Thursday, denies any injury. Pain with any ROM

## 2014-09-21 NOTE — Discharge Instructions (Signed)
See your physician or sports medicine physician if no improvement in one week. Limits weightbearing with that arm until symptoms improved, ice and NSAIDs as needed and tolerated.  If you were given medicines take as directed.  If you are on coumadin or contraceptives realize their levels and effectiveness is altered by many different medicines.  If you have any reaction (rash, tongues swelling, other) to the medicines stop taking and see a physician.   Please follow up as directed and return to the ER or see a physician for new or worsening symptoms.  Thank you. Filed Vitals:   09/21/14 1003  BP: 167/75  Pulse: 63  Temp: 98.4 F (36.9 C)  TempSrc: Oral  Resp: 20  Weight: 320 lb (145.151 kg)  SpO2: 100%

## 2014-09-22 ENCOUNTER — Other Ambulatory Visit (INDEPENDENT_AMBULATORY_CARE_PROVIDER_SITE_OTHER): Payer: Self-pay

## 2014-09-22 DIAGNOSIS — D509 Iron deficiency anemia, unspecified: Secondary | ICD-10-CM

## 2014-09-22 LAB — FECAL OCCULT BLOOD, IMMUNOCHEMICAL: Fecal Occult Bld: NEGATIVE

## 2014-09-23 ENCOUNTER — Encounter: Payer: Self-pay | Admitting: Family

## 2014-10-06 ENCOUNTER — Ambulatory Visit (INDEPENDENT_AMBULATORY_CARE_PROVIDER_SITE_OTHER): Payer: Self-pay | Admitting: Family

## 2014-10-06 ENCOUNTER — Encounter: Payer: Self-pay | Admitting: Family

## 2014-10-06 VITALS — BP 130/88 | HR 63 | Temp 98.3°F | Resp 18 | Ht 64.5 in | Wt 323.0 lb

## 2014-10-06 DIAGNOSIS — M6790 Unspecified disorder of synovium and tendon, unspecified site: Secondary | ICD-10-CM | POA: Insufficient documentation

## 2014-10-06 NOTE — Assessment & Plan Note (Signed)
Improved, monitor. If recurrent/worsening symptoms, consider referral to ortho.

## 2014-10-06 NOTE — Patient Instructions (Signed)
Please follow up in March as scheduled.

## 2014-10-06 NOTE — Progress Notes (Signed)
Pre visit review using our clinic review tool, if applicable. No additional management support is needed unless otherwise documented below in the visit note. 

## 2014-10-06 NOTE — Progress Notes (Signed)
   Subjective:    Patient ID: Marissa Bowman, female    DOB: [birth year], 48 y.o.   MRN: [id number]  HPI  Ms.  Bowman is a 48 yr old female who presents today for ED follow up 12/6. Pt presented with tenderness and pain of the dorsal left forearm. Record is reviewed. Symptoms were felt to be secondary to tendinopathy, muscle strain, epicondylitis. Reports that the arm is mildly swollen but much improved since her visit.    Review of Systems See HPI  Past Medical History  Diagnosis Date  . Asthma     childhood  . Elevated blood pressure reading without diagnosis of hypertension     History   Social History  . Marital Status: Single    Spouse Name: N/A    Number of Children: N/A  . Years of Education: N/A   Occupational History  . Not on file.   Social History Main Topics  . Smoking status: Never Smoker   . Smokeless tobacco: Never Used  . Alcohol Use: No  . Drug Use: Not on file  . Sexual Activity: Not on file   Other Topics Concern  . Not on file   Social History Narrative   1 son- age 79   Unemployed, plans to sub with the [employer]   Associated degree   Lives with mother father/father and son   Has 1 dog lab/spaniel mix   Enjoys reading    No past surgical history on file.  Family History  Problem Relation Age of Onset  . Hypertension Mother   . Hyperlipidemia Mother   . Diabetes Mother     No Known Allergies  Current Outpatient Prescriptions on File Prior to Visit  Medication Sig Dispense Refill  . ferrous sulfate 325 (65 FE) MG tablet Take 325 mg by mouth 2 (two) times daily with a meal.    . fish oil-omega-3 fatty acids 1000 MG capsule Take 1,200 mg by mouth every morning.    . metoprolol tartrate (LOPRESSOR) 25 MG tablet 1/2 tab by mouth twice daily 30 tablet 2  . naproxen (NAPROSYN) 500 MG tablet Take 1 tablet (500 mg total) by mouth 2 (two) times daily as needed. 20 tablet 0   No current facility-administered medications on file prior to  visit.    BP 130/88 mmHg  Pulse 63  Temp(Src) 98.3 F (36.8 C) (Oral)  Resp 18  Ht 5' 4.5" (1.638 m)  Wt 323 lb (146.512 kg)  BMI 54.61 kg/m2  SpO2 99%  LMP 09/13/2014       Objective:   Physical Exam  Constitutional: She is oriented to person, place, and time. She appears well-developed and well-nourished. No distress.  HENT:  Head: Normocephalic and atraumatic.  Cardiovascular: Normal rate and regular rhythm.   No murmur heard. Pulmonary/Chest: Effort normal and breath sounds normal. No respiratory distress. She has no wheezes. She has no rales. She exhibits no tenderness.  Musculoskeletal: She exhibits no edema.  Mild swelling above left dorsal wrist  Lymphadenopathy:    She has no cervical adenopathy.  Neurological: She is alert and oriented to person, place, and time.  Skin: Skin is warm and dry.  Psychiatric: She has a normal mood and affect. Her behavior is normal. Judgment and thought content normal.          Assessment & Plan:

## 2014-12-09 ENCOUNTER — Ambulatory Visit: Payer: Self-pay | Admitting: Family

## 2014-12-11 ENCOUNTER — Ambulatory Visit (INDEPENDENT_AMBULATORY_CARE_PROVIDER_SITE_OTHER): Payer: Self-pay | Admitting: Family

## 2014-12-11 ENCOUNTER — Encounter: Payer: Self-pay | Admitting: Family

## 2014-12-11 ENCOUNTER — Telehealth: Payer: Self-pay | Admitting: *Deleted

## 2014-12-11 VITALS — BP 130/76 | HR 80 | Temp 97.6°F | Resp 18 | Ht 64.5 in | Wt 328.0 lb

## 2014-12-11 DIAGNOSIS — M6790 Unspecified disorder of synovium and tendon, unspecified site: Secondary | ICD-10-CM

## 2014-12-11 DIAGNOSIS — R0789 Other chest pain: Secondary | ICD-10-CM

## 2014-12-11 DIAGNOSIS — I10 Essential (primary) hypertension: Secondary | ICD-10-CM

## 2014-12-11 DIAGNOSIS — Z6841 Body Mass Index (BMI) 40.0 and over, adult: Secondary | ICD-10-CM

## 2014-12-11 DIAGNOSIS — D509 Iron deficiency anemia, unspecified: Secondary | ICD-10-CM

## 2014-12-11 DIAGNOSIS — R079 Chest pain, unspecified: Secondary | ICD-10-CM

## 2014-12-11 NOTE — Patient Instructions (Signed)
Please complete lab work prior to leaving. You will be contacted about your stress test. Go to ER if you develop recurrent chest pain. Follow up in 3 months.

## 2014-12-11 NOTE — Assessment & Plan Note (Signed)
Stable off of metoprolol. OK to remain off.

## 2014-12-11 NOTE — Assessment & Plan Note (Signed)
Resolved

## 2014-12-11 NOTE — Progress Notes (Signed)
Pre visit review using our clinic review tool, if applicable. No additional management support is needed unless otherwise documented below in the visit note. 

## 2014-12-11 NOTE — Assessment & Plan Note (Signed)
Continues iron, obtain follow up cbc, serum iron.

## 2014-12-11 NOTE — Telephone Encounter (Signed)
We do not carrry samples in the office. Attempted to reach pt and left message with her mother to have her return my call.

## 2014-12-11 NOTE — Assessment & Plan Note (Addendum)
Suspect secondary to reflux.  EKG is performed today and appears unchanged as compared to prior ekg.  Will refer for exercise stress test.

## 2014-12-11 NOTE — Assessment & Plan Note (Signed)
No weight loss, despite exercise. Admits to dietary indiscretion (ice cream). Discussed tracking calories using my fitness pal.

## 2014-12-11 NOTE — Progress Notes (Signed)
Subjective:    Patient ID: Marissa Bowman, female    DOB: [birth year], 49 y.o.   MRN: [id number]  HPI  Marissa Bowman is a 49 yr old female who presents today for follow up.  1) anemia- She was started on iron supplements back in October 2015. Reports + compliance with iron, occasional constipation- relieved by prn stool softner.  Lab Results  Component Value Date   WBC 7.0 08/08/2014   HGB 10.1* 08/08/2014   HCT 30.8* 08/08/2014   MCV 73.0* 08/08/2014   PLT 357 08/08/2014   2) HTN- stopped metoprolol in December because ran out.   BP Readings from Last 3 Encounters:  12/11/14 130/76  10/06/14 130/88  09/21/14 132/67   3) L arm pain- reports resolved.  4) Obesity- has had some decrease in appetite. Eats salad for lunch, drinks water, exercises twice daily for 30 minutes.   Wt Readings from Last 3 Encounters:  12/11/14 328 lb (148.78 kg)  10/06/14 323 lb (146.512 kg)  09/21/14 320 lb (145.151 kg)   5) Chest pain- reports sharp intermittent chest pain x 3 days. Had associated sob and nausea. Took aspirin which reduced her pain. Denies current symptoms. She did report some acid reflux.  She was sitting up watching TV when this pain occurred.    Review of Systems See HPI  Past Medical History  Diagnosis Date  . Asthma     childhood  . Elevated blood pressure reading without diagnosis of hypertension     History   Social History  . Marital Status: Single    Spouse Name: N/A  . Number of Children: N/A  . Years of Education: N/A   Occupational History  . Not on file.   Social History Main Topics  . Smoking status: Never Smoker   . Smokeless tobacco: Never Used  . Alcohol Use: No  . Drug Use: Not on file  . Sexual Activity: Not on file   Other Topics Concern  . Not on file   Social History Narrative   1 son- age 88   Unemployed, plans to sub with the [employer]   Associated degree   Lives with mother father/father and son   Has 1 dog lab/spaniel mix   Enjoys  reading    History reviewed. No pertinent past surgical history.  Family History  Problem Relation Age of Onset  . Hypertension Mother   . Hyperlipidemia Mother   . Diabetes Mother     No Known Allergies  Current Outpatient Prescriptions on File Prior to Visit  Medication Sig Dispense Refill  . ferrous sulfate 325 (65 FE) MG tablet Take 325 mg by mouth 2 (two) times daily with a meal.    . naproxen (NAPROSYN) 500 MG tablet Take 1 tablet (500 mg total) by mouth 2 (two) times daily as needed. 20 tablet 0  . metoprolol tartrate (LOPRESSOR) 25 MG tablet 1/2 tab by mouth twice daily (Patient not taking: Reported on 12/11/2014) 30 tablet 2   No current facility-administered medications on file prior to visit.    BP 130/76 mmHg  Pulse 80  Temp(Src) 97.6 F (36.4 C) (Oral)  Resp 18  Ht 5' 4.5" (1.638 m)  Wt 328 lb (148.78 kg)  BMI 55.45 kg/m2  SpO2 99%  LMP 10/17/2014       Objective:   Physical Exam  Constitutional: She is oriented to person, place, and time. She appears well-developed and well-nourished.  HENT:  Head: Normocephalic and atraumatic.  Cardiovascular: Normal rate, regular rhythm and normal heart sounds.   No murmur heard. Pulmonary/Chest: Effort normal and breath sounds normal. No respiratory distress. She has no wheezes.  Neurological: She is alert and oriented to person, place, and time.  Psychiatric: She has a normal mood and affect. Her behavior is normal. Judgment and thought content normal.          Assessment & Plan:

## 2014-12-11 NOTE — Telephone Encounter (Signed)
-----   Message from Oneta Rack sent at 12/11/2014  3:39 PM EST ----- Regarding: Medication Samples  Contact: [phone] Pt would like to know if there is any samples naproxen (NAPROSYN) 500 MG tablet please advise

## 2014-12-12 LAB — CBC WITH DIFFERENTIAL/PLATELET
BASOS ABS: 0 10*3/uL (ref 0.0–0.1)
Basophils Relative: 0.5 % (ref 0.0–3.0)
EOS PCT: 2.9 % (ref 0.0–5.0)
Eosinophils Absolute: 0.2 10*3/uL (ref 0.0–0.7)
HEMATOCRIT: 33.1 % — AB (ref 36.0–46.0)
Hemoglobin: 11 g/dL — ABNORMAL LOW (ref 12.0–15.0)
Lymphocytes Relative: 19.5 % (ref 12.0–46.0)
Lymphs Abs: 1.5 10*3/uL (ref 0.7–4.0)
MCHC: 33.4 g/dL (ref 30.0–36.0)
MCV: 77.6 fl — AB (ref 78.0–100.0)
MONO ABS: 0.2 10*3/uL (ref 0.1–1.0)
Monocytes Relative: 2.6 % — ABNORMAL LOW (ref 3.0–12.0)
NEUTROS PCT: 74.5 % (ref 43.0–77.0)
Neutro Abs: 5.6 10*3/uL (ref 1.4–7.7)
Platelets: 316 10*3/uL (ref 150.0–400.0)
RBC: 4.26 Mil/uL (ref 3.87–5.11)
RDW: 16.4 % — AB (ref 11.5–15.5)
WBC: 7.6 10*3/uL (ref 4.0–10.5)

## 2014-12-12 LAB — IRON: IRON: 42 ug/dL (ref 42–145)

## 2014-12-16 NOTE — Telephone Encounter (Signed)
Left message for pt to check her mychart account as I sent response through mychart since I have been unable to reach her by phone.

## 2014-12-23 ENCOUNTER — Encounter: Payer: Self-pay | Admitting: Cardiology

## 2015-01-23 ENCOUNTER — Ambulatory Visit (INDEPENDENT_AMBULATORY_CARE_PROVIDER_SITE_OTHER): Payer: Self-pay

## 2015-01-23 ENCOUNTER — Telehealth: Payer: Self-pay | Admitting: Family

## 2015-01-23 DIAGNOSIS — R0789 Other chest pain: Secondary | ICD-10-CM

## 2015-01-23 NOTE — Telephone Encounter (Signed)
Stress test looks ok, except bp went up.  Please arrange follow up for bp recheck.

## 2015-01-23 NOTE — Progress Notes (Signed)
Exercise Treadmill Test  Pre-Exercise Testing Evaluation Rhythm: normal sinus  Rate: 72 bpm     Test  Exercise Tolerance Test Ordering MD: Jenkins Rouge, MD  Interpreting MD: Rosaria Ferries, PA-C  Unique Test No: 1  Treadmill:  1  Indication for ETT: chest pain - rule out ischemia  Contraindication to ETT: No   Stress Modality: exercise - treadmill  Cardiac Imaging Performed: none   Protocol: standard Bruce - maximal  Max BP:  255/100  Max MPHR (bpm):  169 85% MPR (bpm):  144  MPHR obtained (bpm):  148 % MPHR obtained:  88  Reached 85% MPHR (min:sec):  3" 25s Total Exercise Time (min-sec):  4"  Workload in METS:  4.8 Borg Scale: 17  Reason ETT Terminated:  patient's desire to stop    ST Segment Analysis At Rest: non-specific ST segment slurring With Exercise: non-specific ST changes  Other Information Arrhythmia:  No Angina during ETT:  absent (0) Quality of ETT:  diagnostic  ETT Interpretation:  normal - no evidence of ischemia by ST analysis  Comments: Pt almost at target in stage I, did not think she could do stage II. Held stage and went up slightly on slope, pt reached target. She was very SOB and fatigued, but had not chest pain. Test stopped at 4 min. No chest pain. ECG with upsloping ST changes, no acute ischemic changes.  Recommendations: Follow up with primary MD. Although pt BP at office visits is not very high, she increased significantly with the treadmill at stage I. May need BP rx.

## 2015-01-26 NOTE — Telephone Encounter (Signed)
Left message with pt's mother to have pt return my call.

## 2015-01-27 NOTE — Telephone Encounter (Signed)
Left message at home # to return my call and sent mychart message.

## 2015-01-28 NOTE — Telephone Encounter (Signed)
Pt called back and was notified of below recommendation. Appt scheduled for 02/03/15 at 7:45am.

## 2015-02-03 ENCOUNTER — Ambulatory Visit (INDEPENDENT_AMBULATORY_CARE_PROVIDER_SITE_OTHER): Payer: Self-pay | Admitting: Family

## 2015-02-03 ENCOUNTER — Encounter: Payer: Self-pay | Admitting: Family

## 2015-02-03 VITALS — BP 142/92 | HR 66 | Temp 98.0°F | Resp 16 | Ht 64.5 in | Wt 329.6 lb

## 2015-02-03 DIAGNOSIS — I10 Essential (primary) hypertension: Secondary | ICD-10-CM

## 2015-02-03 MED ORDER — HYDROCHLOROTHIAZIDE 25 MG PO TABS: 25.0000 mg | ORAL_TABLET | Freq: Every day | ORAL | Status: DC

## 2015-02-03 NOTE — Progress Notes (Signed)
   Subjective:    Patient ID: Marissa Bowman, female    DOB: May 12, 1966, 49 y.o.   MRN: [id number]  HPI  Marissa Bowman is a 49 yr old female who presents today to discuss hypertension. She underwent an exercise stress test on 01/23/15 and was noted to have BP as high as 255/100.   Patient is currently maintained on the following medications for blood pressure: none Patient reports good compliance with blood pressure medications. Patient denies chest pain, shortness of breath or swelling. Last 3 blood pressure readings in our office are as follows: BP Readings from Last 3 Encounters:  02/03/15 142/92  12/11/14 130/76  10/06/14 130/88    Review of Systems    see HPI  Past Medical History  Diagnosis Date  . Asthma     childhood  . Elevated blood pressure reading without diagnosis of hypertension     History   Social History  . Marital Status: Single    Spouse Name: N/A  . Number of Children: N/A  . Years of Education: N/A   Occupational History  . Not on file.   Social History Main Topics  . Smoking status: Never Smoker   . Smokeless tobacco: Never Used  . Alcohol Use: No  . Drug Use: Not on file  . Sexual Activity: Not on file   Other Topics Concern  . Not on file   Social History Narrative   1 son- age 71   Unemployed, plans to sub with the [employer]   Associated degree   Lives with mother father/father and son   Has 1 dog lab/spaniel mix   Enjoys reading    No past surgical history on file.  Family History  Problem Relation Age of Onset  . Hypertension Mother   . Hyperlipidemia Mother   . Diabetes Mother     No Known Allergies  Current Outpatient Prescriptions on File Prior to Visit  Medication Sig Dispense Refill  . ferrous sulfate 325 (65 FE) MG tablet Take 325 mg by mouth 2 (two) times daily with a meal.     No current facility-administered medications on file prior to visit.    BP 142/92 mmHg  Pulse 66  Temp(Src) 98 F (36.7 C) (Oral)   Resp 16  Ht 5' 4.5" (1.638 m)  Wt 329 lb 9.6 oz (149.506 kg)  BMI 55.72 kg/m2  SpO2 99%  LMP 02/01/2015    Objective:   Physical Exam  Constitutional: She is oriented to person, place, and time. She appears well-developed and well-nourished.  Cardiovascular: Normal rate, regular rhythm and normal heart sounds.   No murmur heard. Pulmonary/Chest: Effort normal and breath sounds normal. No respiratory distress. She has no wheezes.  Musculoskeletal: She exhibits no edema.  Neurological: She is alert and oriented to person, place, and time.  Psychiatric: She has a normal mood and affect. Her behavior is normal. Judgment and thought content normal.          Assessment & Plan:

## 2015-02-03 NOTE — Assessment & Plan Note (Addendum)
Uncontrolled. Add hctz. Follow up in 1 week for bp recheck and bmet.

## 2015-02-03 NOTE — Progress Notes (Signed)
Pre visit review using our clinic review tool, if applicable. No additional management support is needed unless otherwise documented below in the visit note. 

## 2015-02-03 NOTE — Patient Instructions (Addendum)
Start HCTZ once daily.  Follow up in 1 week.

## 2015-02-13 ENCOUNTER — Encounter: Payer: Self-pay | Admitting: Family

## 2015-02-13 ENCOUNTER — Ambulatory Visit (INDEPENDENT_AMBULATORY_CARE_PROVIDER_SITE_OTHER): Payer: Self-pay | Admitting: Family

## 2015-02-13 ENCOUNTER — Ambulatory Visit: Payer: Self-pay | Admitting: Family

## 2015-02-13 VITALS — BP 144/88 | HR 64 | Temp 98.1°F | Ht 65.0 in | Wt 318.6 lb

## 2015-02-13 DIAGNOSIS — I10 Essential (primary) hypertension: Secondary | ICD-10-CM

## 2015-02-13 MED ORDER — LISINOPRIL 10 MG PO TABS: 10.0000 mg | ORAL_TABLET | Freq: Every day | ORAL | Status: DC

## 2015-02-13 NOTE — Progress Notes (Signed)
   Subjective:    Patient ID: Marissa Bowman, female    DOB: 03-07-1966, 49 y.o.   MRN: [id number]  HPI Marissa Bowman is a 49 yr old female who presents today for follow up. She underwent an ETT on 4/8 which noted elevation of BP during her visit.    Patient is currently maintained on the following medications for blood pressure: hctz Patient reports good compliance with blood pressure medications. Patient denies chest pain, shortness of breath or swelling. Last 3 blood pressure readings in our office are as follows:  BP Readings from Last 3 Encounters:  02/13/15 144/88  02/03/15 142/92  12/11/14 130/76    Review of Systems See HPI  Past Medical History  Diagnosis Date  . Asthma     childhood  . Elevated blood pressure reading without diagnosis of hypertension     History   Social History  . Marital Status: Single    Spouse Name: N/A  . Number of Children: N/A  . Years of Education: N/A   Occupational History  . Not on file.   Social History Main Topics  . Smoking status: Never Smoker   . Smokeless tobacco: Never Used  . Alcohol Use: No  . Drug Use: Not on file  . Sexual Activity: Not on file   Other Topics Concern  . Not on file   Social History Narrative   1 son- age 22   Unemployed, plans to sub with the [employer]   Associated degree   Lives with mother father/father and son   Has 1 dog lab/spaniel mix   Enjoys reading    No past surgical history on file.  Family History  Problem Relation Age of Onset  . Hypertension Mother   . Hyperlipidemia Mother   . Diabetes Mother     No Known Allergies  Current Outpatient Prescriptions on File Prior to Visit  Medication Sig Dispense Refill  . ferrous sulfate 325 (65 FE) MG tablet Take 325 mg by mouth 2 (two) times daily with a meal.    . hydrochlorothiazide (HYDRODIURIL) 25 MG tablet Take 1 tablet (25 mg total) by mouth daily. 30 tablet 5   No current facility-administered medications on file prior to  visit.    BP 144/88 mmHg  Pulse 64  Temp(Src) 98.1 F (36.7 C) (Oral)  Ht 5\' 5"  (1.651 m)  Wt 318 lb 9.6 oz (144.516 kg)  BMI 53.02 kg/m2  SpO2 96%  LMP 02/01/2015       Objective:   Physical Exam  Constitutional: She is oriented to person, place, and time. She appears well-developed and well-nourished.  Cardiovascular: Normal rate, regular rhythm and normal heart sounds.   No murmur heard. Pulmonary/Chest: Effort normal and breath sounds normal. No respiratory distress. She has no wheezes.  Musculoskeletal: She exhibits no edema.  Neurological: She is alert and oriented to person, place, and time.  Psychiatric: She has a normal mood and affect. Her behavior is normal. Judgment and thought content normal.          Assessment & Plan:

## 2015-02-13 NOTE — Assessment & Plan Note (Signed)
Uncontrolled, add lisinopril (advised pt not to become pregnant on lisinopril). She is not sexually active.  Continue hctz, plan follow up in 1 month for BP recheck and bmet.  She was advised on rare SE of angioedema and advised to go to ED if this occurs. She verbalizes understanding.

## 2015-02-13 NOTE — Patient Instructions (Signed)
Start lisinopril 10mg  once daily. Follow up in 1 month.

## 2015-03-06 ENCOUNTER — Encounter: Payer: Self-pay | Admitting: Family

## 2015-03-06 ENCOUNTER — Ambulatory Visit (INDEPENDENT_AMBULATORY_CARE_PROVIDER_SITE_OTHER): Payer: Self-pay | Admitting: Family

## 2015-03-06 VITALS — BP 122/68 | HR 66 | Temp 98.5°F | Resp 16 | Ht 65.0 in | Wt 320.6 lb

## 2015-03-06 DIAGNOSIS — I10 Essential (primary) hypertension: Secondary | ICD-10-CM

## 2015-03-06 LAB — BASIC METABOLIC PANEL
BUN: 12 mg/dL (ref 6–23)
CALCIUM: 8.7 mg/dL (ref 8.4–10.5)
CHLORIDE: 99 meq/L (ref 96–112)
CO2: 27 meq/L (ref 19–32)
CREATININE: 0.81 mg/dL (ref 0.50–1.10)
Glucose, Bld: 72 mg/dL (ref 70–99)
Potassium: 3.5 mEq/L (ref 3.5–5.3)
SODIUM: 137 meq/L (ref 135–145)

## 2015-03-06 NOTE — Progress Notes (Signed)
   Subjective:    Patient ID: Marissa Bowman, female    DOB: 10-10-1966, 49 y.o.   MRN: [id number]  HPI  Ms. Marissa Bowman is a 49 yr old female who presents today for follow up of her hypertension. Last visit lisinopril was added to her regimen.  Denies cp/sob or swelling. + cough. Tolerating med. BP Readings from Last 3 Encounters:  03/06/15 122/68  02/13/15 144/88  02/03/15 142/92   Review of Systems    see HPI  Past Medical History  Diagnosis Date  . Asthma     childhood  . Elevated blood pressure reading without diagnosis of hypertension     History   Social History  . Marital Status: Single    Spouse Name: N/A  . Number of Children: N/A  . Years of Education: N/A   Occupational History  . Not on file.   Social History Main Topics  . Smoking status: Never Smoker   . Smokeless tobacco: Never Used  . Alcohol Use: No  . Drug Use: Not on file  . Sexual Activity: Not on file   Other Topics Concern  . Not on file   Social History Narrative   1 son- age 75   Unemployed, plans to sub with the [employer]   Associated degree   Lives with mother father/father and son   Has 1 dog lab/spaniel mix   Enjoys reading    No past surgical history on file.  Family History  Problem Relation Age of Onset  . Hypertension Mother   . Hyperlipidemia Mother   . Diabetes Mother     No Known Allergies  Current Outpatient Prescriptions on File Prior to Visit  Medication Sig Dispense Refill  . ferrous sulfate 325 (65 FE) MG tablet Take 325 mg by mouth 2 (two) times daily with a meal.    . hydrochlorothiazide (HYDRODIURIL) 25 MG tablet Take 1 tablet (25 mg total) by mouth daily. 30 tablet 5  . lisinopril (PRINIVIL,ZESTRIL) 10 MG tablet Take 1 tablet (10 mg total) by mouth daily. 30 tablet 3   No current facility-administered medications on file prior to visit.    BP 122/68 mmHg  Pulse 66  Temp(Src) 98.5 F (36.9 C) (Oral)  Resp 16  Ht 5\' 5"  (1.651 m)  Wt 320 lb 9.6 oz  (145.423 kg)  BMI 53.35 kg/m2  SpO2 99%  LMP 02/04/2015    Objective:   Physical Exam  Constitutional: She appears well-developed and well-nourished.  Cardiovascular: Normal rate, regular rhythm and normal heart sounds.   No murmur heard. Pulmonary/Chest: Effort normal and breath sounds normal. No respiratory distress. She has no wheezes.  Psychiatric: She has a normal mood and affect. Her behavior is normal. Judgment and thought content normal.          Assessment & Plan:

## 2015-03-06 NOTE — Assessment & Plan Note (Signed)
Improved, continue lisinopril, obtain follow up bmet.

## 2015-03-06 NOTE — Patient Instructions (Signed)
Please complete lab work prior to leaving. Follow up in 3 months.  

## 2015-03-06 NOTE — Progress Notes (Signed)
Pre visit review using our clinic review tool, if applicable. No additional management support is needed unless otherwise documented below in the visit note. 

## 2015-06-03 ENCOUNTER — Encounter: Payer: Self-pay | Admitting: Family

## 2015-06-03 ENCOUNTER — Ambulatory Visit (INDEPENDENT_AMBULATORY_CARE_PROVIDER_SITE_OTHER): Payer: Self-pay | Admitting: Family

## 2015-06-03 VITALS — BP 130/82 | HR 75 | Temp 98.0°F | Resp 16 | Ht 65.0 in | Wt 310.2 lb

## 2015-06-03 DIAGNOSIS — D509 Iron deficiency anemia, unspecified: Secondary | ICD-10-CM

## 2015-06-03 DIAGNOSIS — I10 Essential (primary) hypertension: Secondary | ICD-10-CM

## 2015-06-03 NOTE — Progress Notes (Signed)
Pre visit review using our clinic review tool, if applicable. No additional management support is needed unless otherwise documented below in the visit note. 

## 2015-06-04 ENCOUNTER — Telehealth: Payer: Self-pay | Admitting: Family

## 2015-06-04 NOTE — Assessment & Plan Note (Signed)
BP stable on current meds. Continue same. Obtain bmet.

## 2015-06-04 NOTE — Progress Notes (Signed)
   Subjective:    Patient ID: Marissa Bowman, female    DOB: August 17, 1966, 49 y.o.   MRN: [id number]  HPI  Marissa Bowman is a 49 yr old female who presents today for follow up.  1) HTN- she is maintained on lisinopril/hctz. Tolerating without difficulty. Good compliance.  Denies CP/SOB or swelling.  BP Readings from Last 3 Encounters:  06/03/15 130/82  03/06/15 122/68  02/13/15 144/88   2) Anemia- continues iron once daily with breakfast.   Lab Results  Component Value Date   WBC 7.6 12/11/2014   HGB 11.0* 12/11/2014   HCT 33.1* 12/11/2014   MCV 77.6* 12/11/2014   PLT 316.0 12/11/2014    Review of Systems See HPI  Past Medical History  Diagnosis Date  . Asthma     childhood  . Elevated blood pressure reading without diagnosis of hypertension     Social History   Social History  . Marital Status: Single    Spouse Name: N/A  . Number of Children: N/A  . Years of Education: N/A   Occupational History  . Not on file.   Social History Main Topics  . Smoking status: Never Smoker   . Smokeless tobacco: Never Used  . Alcohol Use: No  . Drug Use: Not on file  . Sexual Activity: Not on file   Other Topics Concern  . Not on file   Social History Narrative   1 son- age 64   Unemployed, plans to sub with the [employer]   Associated degree   Lives with mother father/father and son   Has 1 dog lab/spaniel mix   Enjoys reading    No past surgical history on file.  Family History  Problem Relation Age of Onset  . Hypertension Mother   . Hyperlipidemia Mother   . Diabetes Mother     No Known Allergies  Current Outpatient Prescriptions on File Prior to Visit  Medication Sig Dispense Refill  . ferrous sulfate 325 (65 FE) MG tablet Take 325 mg by mouth daily with breakfast.     . hydrochlorothiazide (HYDRODIURIL) 25 MG tablet Take 1 tablet (25 mg total) by mouth daily. 30 tablet 5  . lisinopril (PRINIVIL,ZESTRIL) 10 MG tablet Take 1 tablet (10 mg total) by mouth  daily. 30 tablet 3   No current facility-administered medications on file prior to visit.    BP 130/82 mmHg  Pulse 75  Temp(Src) 98 F (36.7 C) (Oral)  Resp 16  Ht 5\' 5"  (1.651 m)  Wt 310 lb 3.2 oz (140.706 kg)  BMI 51.62 kg/m2  SpO2 99%       Objective:   Physical Exam  Constitutional: She appears well-developed and well-nourished.  Cardiovascular: Normal rate, regular rhythm and normal heart sounds.   No murmur heard. Pulmonary/Chest: Effort normal and breath sounds normal. No respiratory distress. She has no wheezes.  Musculoskeletal: She exhibits no edema.  Psychiatric: She has a normal mood and affect. Her behavior is normal. Judgment and thought content normal.          Assessment & Plan:

## 2015-06-04 NOTE — Assessment & Plan Note (Signed)
Tolerating iron. Continue same.  Obtain cbc, iron level.

## 2015-06-04 NOTE — Telephone Encounter (Signed)
See my chart message

## 2015-06-08 ENCOUNTER — Other Ambulatory Visit (INDEPENDENT_AMBULATORY_CARE_PROVIDER_SITE_OTHER): Payer: Self-pay

## 2015-06-08 DIAGNOSIS — D509 Iron deficiency anemia, unspecified: Secondary | ICD-10-CM

## 2015-06-08 DIAGNOSIS — I10 Essential (primary) hypertension: Secondary | ICD-10-CM

## 2015-06-08 LAB — BASIC METABOLIC PANEL
BUN: 7 mg/dL (ref 6–23)
CHLORIDE: 97 meq/L (ref 96–112)
CO2: 32 mEq/L (ref 19–32)
Calcium: 8.9 mg/dL (ref 8.4–10.5)
Creatinine, Ser: 0.65 mg/dL (ref 0.40–1.20)
GFR: 123.33 mL/min (ref >60.00)
GLUCOSE: 105 mg/dL — AB (ref 70–99)
POTASSIUM: 3 meq/L — AB (ref 3.5–5.1)
SODIUM: 138 meq/L (ref 135–145)

## 2015-06-08 LAB — CBC WITH DIFFERENTIAL/PLATELET
Basophils Absolute: 0 10*3/uL (ref 0.0–0.1)
Basophils Relative: 0.4 % (ref 0.0–3.0)
EOS ABS: 0.2 10*3/uL (ref 0.0–0.7)
Eosinophils Relative: 3.1 % (ref 0.0–5.0)
HCT: 33.9 % — ABNORMAL LOW (ref 36.0–46.0)
HEMOGLOBIN: 11.2 g/dL — AB (ref 12.0–15.0)
LYMPHS PCT: 17.9 % (ref 12.0–46.0)
Lymphs Abs: 1.3 10*3/uL (ref 0.7–4.0)
MCHC: 33.1 g/dL (ref 30.0–36.0)
MCV: 78.6 fl (ref 78.0–100.0)
MONO ABS: 0.4 10*3/uL (ref 0.1–1.0)
Monocytes Relative: 5.2 % (ref 3.0–12.0)
Neutro Abs: 5.3 10*3/uL (ref 1.4–7.7)
Neutrophils Relative %: 73.4 % (ref 43.0–77.0)
Platelets: 387 10*3/uL (ref 150.0–400.0)
RBC: 4.31 Mil/uL (ref 3.87–5.11)
RDW: 15 % (ref 11.5–15.5)
WBC: 7.3 10*3/uL (ref 4.0–10.5)

## 2015-06-08 LAB — IRON: IRON: 70 ug/dL (ref 42–145)

## 2015-06-09 ENCOUNTER — Telehealth: Payer: Self-pay

## 2015-06-09 MED ORDER — POTASSIUM CHLORIDE CRYS ER 20 MEQ PO TBCR: 20.0000 meq | EXTENDED_RELEASE_TABLET | Freq: Every day | ORAL | Status: DC

## 2015-06-09 NOTE — Telephone Encounter (Signed)
Pt was notified and she voices understanding.

## 2015-06-25 ENCOUNTER — Encounter: Payer: Self-pay | Admitting: Family

## 2015-06-28 ENCOUNTER — Encounter: Payer: Self-pay | Admitting: Family

## 2015-07-17 ENCOUNTER — Telehealth: Payer: Self-pay | Admitting: *Deleted

## 2015-07-17 MED ORDER — POTASSIUM CHLORIDE CRYS ER 20 MEQ PO TBCR: 20.0000 meq | EXTENDED_RELEASE_TABLET | Freq: Every day | ORAL | Status: DC

## 2015-07-17 NOTE — Telephone Encounter (Signed)
Received request from pharmacy for potassium 71meq. Refill sent.

## 2015-08-17 ENCOUNTER — Other Ambulatory Visit: Payer: Self-pay | Admitting: Family

## 2015-08-24 ENCOUNTER — Other Ambulatory Visit: Payer: Self-pay | Admitting: Family

## 2015-08-24 NOTE — Telephone Encounter (Signed)
Notes Recorded by Brunetta Jeans, PA-C on 06/09/2015 at 7:16 AM Answering for [facility] who is out of office. Anemia is improving, iron level increasing. Continue iron supplementation.  Potassium is low likely from BP medication. Increase intake of potassium-rich foods -- cereals, bananas, lima beans, etc. Recommend we start a supplement. Send in Rx Potassium 20 mEq once daily.  Follow-up 1 month to reassess K+ levels.  Medication Detail      Disp Refills Start End     hydrochlorothiazide (HYDRODIURIL) 25 MG tablet 30 tablet 5 02/03/2015     Sig - Route: Take 1 tablet (25 mg total) by mouth daily. - Oral    E-Prescribing Status: Receipt confirmed by pharmacy (02/03/2015 7:50 AM EDT)   Pharmacy    [place] [place] [facility] [place] with patient's PCP about this matter, as patient has yet to have her potassium rechecked from 06/09/15 note. Per PCP, patient needs to come in to office and have lab drawn to recheck Potassium before receiving hard copy placed at front desk, this information has been relayed to pharmacy. LMOM with contact name and number for return call RE:  Scheduling lab appointment and p/u script per provider instructions/SLS

## 2015-09-02 ENCOUNTER — Encounter: Payer: Self-pay | Admitting: Family

## 2015-09-02 ENCOUNTER — Ambulatory Visit (INDEPENDENT_AMBULATORY_CARE_PROVIDER_SITE_OTHER): Payer: Self-pay | Admitting: Family

## 2015-09-02 VITALS — BP 138/80 | HR 84 | Temp 98.5°F | Resp 16 | Ht 65.0 in | Wt 317.2 lb

## 2015-09-02 DIAGNOSIS — I10 Essential (primary) hypertension: Secondary | ICD-10-CM

## 2015-09-02 DIAGNOSIS — D509 Iron deficiency anemia, unspecified: Secondary | ICD-10-CM

## 2015-09-02 DIAGNOSIS — Z1159 Encounter for screening for other viral diseases: Secondary | ICD-10-CM

## 2015-09-02 DIAGNOSIS — Z6841 Body Mass Index (BMI) 40.0 and over, adult: Secondary | ICD-10-CM

## 2015-09-02 DIAGNOSIS — Z23 Encounter for immunization: Secondary | ICD-10-CM

## 2015-09-02 DIAGNOSIS — Z114 Encounter for screening for human immunodeficiency virus [HIV]: Secondary | ICD-10-CM

## 2015-09-02 LAB — CBC WITH DIFFERENTIAL/PLATELET
Basophils Absolute: 0 10*3/uL (ref 0.0–0.1)
Basophils Relative: 0.4 % (ref 0.0–3.0)
Eosinophils Absolute: 0.2 10*3/uL (ref 0.0–0.7)
Eosinophils Relative: 3.9 % (ref 0.0–5.0)
HCT: 33 % — ABNORMAL LOW (ref 36.0–46.0)
HEMOGLOBIN: 10.8 g/dL — AB (ref 12.0–15.0)
LYMPHS ABS: 1.2 10*3/uL (ref 0.7–4.0)
Lymphocytes Relative: 22.4 % (ref 12.0–46.0)
MCHC: 32.7 g/dL (ref 30.0–36.0)
MCV: 80.5 fl (ref 78.0–100.0)
MONOS PCT: 4.4 % (ref 3.0–12.0)
Monocytes Absolute: 0.2 10*3/uL (ref 0.1–1.0)
NEUTROS PCT: 68.9 % (ref 43.0–77.0)
Neutro Abs: 3.6 10*3/uL (ref 1.4–7.7)
Platelets: 298 10*3/uL (ref 150.0–400.0)
RBC: 4.1 Mil/uL (ref 3.87–5.11)
RDW: 15.3 % (ref 11.5–15.5)
WBC: 5.3 10*3/uL (ref 4.0–10.5)

## 2015-09-02 LAB — HEPATITIS C ANTIBODY: HCV Ab: NEGATIVE

## 2015-09-02 LAB — IRON: IRON: 73 ug/dL (ref 42–145)

## 2015-09-02 MED ORDER — LISINOPRIL 10 MG PO TABS: ORAL_TABLET | ORAL | Status: DC

## 2015-09-02 MED ORDER — POTASSIUM CHLORIDE CRYS ER 20 MEQ PO TBCR: 20.0000 meq | EXTENDED_RELEASE_TABLET | Freq: Every day | ORAL | Status: DC

## 2015-09-02 NOTE — Assessment & Plan Note (Signed)
BP stable on current meds.  Obtain bmet. 

## 2015-09-02 NOTE — Assessment & Plan Note (Signed)
Tolerating iron, obtain follow up iron level and cbc.

## 2015-09-02 NOTE — Progress Notes (Signed)
   Subjective:    Patient ID: Marissa Bowman, female    DOB: 06/22/1966, 49 y.o.   MRN: [id number]  HPI  HTN-  BP Readings from Last 3 Encounters:  09/02/15 138/80  06/03/15 130/82  03/06/15 122/68   Anemia- reports no significant constipation on iron.  Lab Results  Component Value Date   WBC 7.3 06/08/2015   HGB 11.2* 06/08/2015   HCT 33.9* 06/08/2015   MCV 78.6 06/08/2015   PLT 387.0 06/08/2015   Obesity- reports that she continues to walk. Has had some weight gain since last visit. Admits to sweet tea often.  Wt Readings from Last 3 Encounters:  09/02/15 317 lb 3.2 oz (143.881 kg)  06/03/15 310 lb 3.2 oz (140.706 kg)  03/06/15 320 lb 9.6 oz (145.423 kg)      Review of Systems  Respiratory: Negative for shortness of breath.   Cardiovascular: Negative for chest pain and leg swelling.   Past Medical History  Diagnosis Date  . Asthma     childhood  . Elevated blood pressure reading without diagnosis of hypertension     Social History   Social History  . Marital Status: Single    Spouse Name: N/A  . Number of Children: N/A  . Years of Education: N/A   Occupational History  . Not on file.   Social History Main Topics  . Smoking status: Never Smoker   . Smokeless tobacco: Never Used  . Alcohol Use: No  . Drug Use: Not on file  . Sexual Activity: Not on file   Other Topics Concern  . Not on file   Social History Narrative   1 son- age 57   Unemployed, plans to sub with the [employer]   Associated degree   Lives with mother father/father and son   Has 1 dog lab/spaniel mix   Enjoys reading    No past surgical history on file.  Family History  Problem Relation Age of Onset  . Hypertension Mother   . Hyperlipidemia Mother   . Diabetes Mother     No Known Allergies  Current Outpatient Prescriptions on File Prior to Visit  Medication Sig Dispense Refill  . ferrous sulfate 325 (65 FE) MG tablet Take 325 mg by mouth daily with breakfast.     .  hydrochlorothiazide (HYDRODIURIL) 25 MG tablet TAKE 1 TABLET (25 MG) BY MOUTH DAILY. 30 tablet 5   No current facility-administered medications on file prior to visit.    BP 138/80 mmHg  Pulse 84  Temp(Src) 98.5 F (36.9 C) (Oral)  Resp 16  Ht 5\' 5"  (1.651 m)  Wt 317 lb 3.2 oz (143.881 kg)  BMI 52.78 kg/m2  LMP 05/30/2015       Objective:   Physical Exam  Constitutional: She appears well-developed and well-nourished.  Cardiovascular: Normal rate, regular rhythm and normal heart sounds.   No murmur heard. Pulmonary/Chest: Effort normal and breath sounds normal. No respiratory distress. She has no wheezes.  Psychiatric: She has a normal mood and affect. Her behavior is normal. Judgment and thought content normal.          Assessment & Plan:  Flu shot today.

## 2015-09-02 NOTE — Assessment & Plan Note (Signed)
Discussed diet/exercise weight loss- eliminating sweet tea from her diet.

## 2015-09-02 NOTE — Progress Notes (Signed)
Pre visit review using our clinic review tool, if applicable. No additional management support is needed unless otherwise documented below in the visit note. 

## 2015-09-02 NOTE — Patient Instructions (Signed)
Please complete lab work prior to leaving. Work on eliminating sweet tea, eating smaller portions of carbohydrates. Continue regular exercise.

## 2015-09-03 ENCOUNTER — Encounter: Payer: Self-pay | Admitting: Family

## 2015-09-03 LAB — HIV ANTIBODY (ROUTINE TESTING W REFLEX): HIV 1&2 Ab, 4th Generation: NONREACTIVE

## 2015-09-16 NOTE — Telephone Encounter (Signed)
Opened in error

## 2015-12-04 ENCOUNTER — Encounter: Payer: Self-pay | Admitting: Family

## 2015-12-09 MED FILL — POTASSIUM CL ER 20 MEQ TABL: 20 | 30 days supply | Qty: 30 | Fill #1

## 2015-12-21 ENCOUNTER — Other Ambulatory Visit: Payer: Self-pay | Admitting: Family

## 2015-12-21 NOTE — Telephone Encounter (Signed)
This medication refill request was denied today. Pt has requested refill too soon.

## 2016-01-11 ENCOUNTER — Ambulatory Visit (INDEPENDENT_AMBULATORY_CARE_PROVIDER_SITE_OTHER): Payer: Self-pay | Admitting: Family

## 2016-01-11 ENCOUNTER — Encounter: Payer: Self-pay | Admitting: Family

## 2016-01-11 VITALS — BP 126/84 | HR 63 | Temp 98.0°F | Resp 18 | Ht 64.0 in | Wt 309.0 lb

## 2016-01-11 DIAGNOSIS — Z1211 Encounter for screening for malignant neoplasm of colon: Secondary | ICD-10-CM

## 2016-01-11 DIAGNOSIS — Z1239 Encounter for other screening for malignant neoplasm of breast: Secondary | ICD-10-CM

## 2016-01-11 DIAGNOSIS — I10 Essential (primary) hypertension: Secondary | ICD-10-CM

## 2016-01-11 DIAGNOSIS — Z0000 Encounter for general adult medical examination without abnormal findings: Secondary | ICD-10-CM

## 2016-01-11 LAB — URINALYSIS, ROUTINE W REFLEX MICROSCOPIC
BILIRUBIN URINE: NEGATIVE
Ketones, ur: NEGATIVE
LEUKOCYTES UA: NEGATIVE
NITRITE: NEGATIVE
Specific Gravity, Urine: 1.015 (ref 1.000–1.030)
Total Protein, Urine: NEGATIVE
Urine Glucose: NEGATIVE
Urobilinogen, UA: 1 (ref 0.0–1.0)
pH: 7 (ref 5.0–8.0)

## 2016-01-11 LAB — HEPATIC FUNCTION PANEL
ALBUMIN: 4 g/dL (ref 3.5–5.2)
ALT: 10 U/L (ref 0–35)
AST: 13 U/L (ref 0–37)
Alkaline Phosphatase: 71 U/L (ref 39–117)
BILIRUBIN TOTAL: 0.3 mg/dL (ref 0.2–1.2)
Bilirubin, Direct: 0 mg/dL (ref 0.0–0.3)
Total Protein: 7.7 g/dL (ref 6.0–8.3)

## 2016-01-11 LAB — TSH: TSH: 2.47 u[IU]/mL (ref 0.35–4.50)

## 2016-01-11 LAB — LIPID PANEL
CHOLESTEROL: 233 mg/dL — AB (ref 0–200)
HDL: 58 mg/dL (ref >39.00)
LDL CALC: 154 mg/dL — AB (ref 0–99)
NonHDL: 175.12
TRIGLYCERIDES: 106 mg/dL (ref 0.0–149.0)
Total CHOL/HDL Ratio: 4
VLDL: 21.2 mg/dL (ref 0.0–40.0)

## 2016-01-11 LAB — CBC WITH DIFFERENTIAL/PLATELET
BASOS PCT: 1.1 % (ref 0.0–3.0)
Basophils Absolute: 0.1 10*3/uL (ref 0.0–0.1)
EOS ABS: 0.2 10*3/uL (ref 0.0–0.7)
Eosinophils Relative: 2.8 % (ref 0.0–5.0)
HCT: 38.2 % (ref 36.0–46.0)
Hemoglobin: 12.3 g/dL (ref 12.0–15.0)
Lymphocytes Relative: 20.5 % (ref 12.0–46.0)
Lymphs Abs: 1.4 10*3/uL (ref 0.7–4.0)
MCHC: 32.1 g/dL (ref 30.0–36.0)
MCV: 79.3 fl (ref 78.0–100.0)
MONO ABS: 0.4 10*3/uL (ref 0.1–1.0)
Monocytes Relative: 6 % (ref 3.0–12.0)
NEUTROS ABS: 4.7 10*3/uL (ref 1.4–7.7)
NEUTROS PCT: 69.6 % (ref 43.0–77.0)
PLATELETS: 241 10*3/uL (ref 150.0–400.0)
RBC: 4.82 Mil/uL (ref 3.87–5.11)
RDW: 14.8 % (ref 11.5–15.5)
WBC: 6.8 10*3/uL (ref 4.0–10.5)

## 2016-01-11 LAB — BASIC METABOLIC PANEL
BUN: 10 mg/dL (ref 6–23)
CHLORIDE: 104 meq/L (ref 96–112)
CO2: 28 meq/L (ref 19–32)
Calcium: 9.2 mg/dL (ref 8.4–10.5)
Creatinine, Ser: 0.64 mg/dL (ref 0.40–1.20)
GFR: 125.27 mL/min (ref >60.00)
Glucose, Bld: 92 mg/dL (ref 70–99)
Potassium: 4.1 mEq/L (ref 3.5–5.1)
Sodium: 141 mEq/L (ref 135–145)

## 2016-01-11 MED ORDER — AZITHROMYCIN 250 MG PO TABS: ORAL_TABLET | ORAL | Status: DC

## 2016-01-11 MED ORDER — HYDROCOD POLST-CPM POLST ER 10-8 MG/5ML PO SUER: 5.0000 mL | Freq: Two times a day (BID) | ORAL | Status: DC | PRN

## 2016-01-11 MED ORDER — POTASSIUM CHLORIDE CRYS ER 20 MEQ PO TBCR: 20.0000 meq | EXTENDED_RELEASE_TABLET | Freq: Every day | ORAL | Status: DC

## 2016-01-11 MED ORDER — LISINOPRIL 10 MG PO TABS
ORAL_TABLET | ORAL | Status: DC
Start: 2016-01-11 — End: 2016-08-08

## 2016-01-11 NOTE — Assessment & Plan Note (Signed)
Discussed healthy diet, exercise and weight loss.  Refer for colo, mammo.  Immunizations reviewed and up to date.

## 2016-01-11 NOTE — Assessment & Plan Note (Signed)
BP stable off of hctz, ok to remain off HCTZ, continue lisinopril.

## 2016-01-11 NOTE — Patient Instructions (Addendum)
Try meal planning. Goal:Try walking 5 times a weeks and to lose 1 pound per week.  Ok to remain of HCTZ medication.  Preventive Care for Adults, Female A healthy lifestyle and preventive care can promote health and wellness. Preventive health guidelines for women include the following key practices.  A routine yearly physical is a good way to check with your health care provider about your health and preventive screening. It is a chance to share any concerns and updates on your health and to receive a thorough exam.  Visit your dentist for a routine exam and preventive care every 6 months. Brush your teeth twice a day and floss once a day. Good oral hygiene prevents tooth decay and gum disease.  The frequency of eye exams is based on your age, health, family medical history, use of contact lenses, and other factors. Follow your health care provider's recommendations for frequency of eye exams.  Eat a healthy diet. Foods like vegetables, fruits, whole grains, low-fat dairy products, and lean protein foods contain the nutrients you need without too many calories. Decrease your intake of foods high in solid fats, added sugars, and salt. Eat the right amount of calories for you.Get information about a proper diet from your health care provider, if necessary.  Regular physical exercise is one of the most important things you can do for your health. Most adults should get at least 150 minutes of moderate-intensity exercise (any activity that increases your heart rate and causes you to sweat) each week. In addition, most adults need muscle-strengthening exercises on 2 or more days a week.  Maintain a healthy weight. The body mass index (BMI) is a screening tool to identify possible weight problems. It provides an estimate of body fat based on height and weight. Your health care provider can find your BMI and can help you achieve or maintain a healthy weight.For adults 20 years and older:  A BMI below  18.5 is considered underweight.  A BMI of 18.5 to 24.9 is normal.  A BMI of 25 to 29.9 is considered overweight.  A BMI of 30 and above is considered obese.  Maintain normal blood lipids and cholesterol levels by exercising and minimizing your intake of saturated fat. Eat a balanced diet with plenty of fruit and vegetables. Blood tests for lipids and cholesterol should begin at age 74 and be repeated every 5 years. If your lipid or cholesterol levels are high, you are over 50, or you are at high risk for heart disease, you may need your cholesterol levels checked more frequently.Ongoing high lipid and cholesterol levels should be treated with medicines if diet and exercise are not working.  If you smoke, find out from your health care provider how to quit. If you do not use tobacco, do not start.  Lung cancer screening is recommended for adults aged 107-80 years who are at high risk for developing lung cancer because of a history of smoking. A yearly low-dose CT scan of the lungs is recommended for people who have at least a 30-pack-year history of smoking and are a current smoker or have quit within the past 15 years. A pack year of smoking is smoking an average of 1 pack of cigarettes a day for 1 year (for example: 1 pack a day for 30 years or 2 packs a day for 15 years). Yearly screening should continue until the smoker has stopped smoking for at least 15 years. Yearly screening should be stopped for people who develop  a health problem that would prevent them from having lung cancer treatment.  If you are pregnant, do not drink alcohol. If you are breastfeeding, be very cautious about drinking alcohol. If you are not pregnant and choose to drink alcohol, do not have more than 1 drink per day. One drink is considered to be 12 ounces (355 mL) of beer, 5 ounces (148 mL) of wine, or 1.5 ounces (44 mL) of liquor.  Avoid use of street drugs. Do not share needles with anyone. Ask for help if you need  support or instructions about stopping the use of drugs.  High blood pressure causes heart disease and increases the risk of stroke. Your blood pressure should be checked at least every 1 to 2 years. Ongoing high blood pressure should be treated with medicines if weight loss and exercise do not work.  If you are 79-41 years old, ask your health care provider if you should take aspirin to prevent strokes.  Diabetes screening is done by taking a blood sample to check your blood glucose level after you have not eaten for a certain period of time (fasting). If you are not overweight and you do not have risk factors for diabetes, you should be screened once every 3 years starting at age 24. If you are overweight or obese and you are 31-26 years of age, you should be screened for diabetes every year as part of your cardiovascular risk assessment.  Breast cancer screening is essential preventive care for women. You should practice "breast self-awareness." This means understanding the normal appearance and feel of your breasts and may include breast self-examination. Any changes detected, no matter how small, should be reported to a health care provider. Women in their 30s and 30s should have a clinical breast exam (CBE) by a health care provider as part of a regular health exam every 1 to 3 years. After age 98, women should have a CBE every year. Starting at age 91, women should consider having a mammogram (breast X-ray test) every year. Women who have a family history of breast cancer should talk to their health care provider about genetic screening. Women at a high risk of breast cancer should talk to their health care providers about having an MRI and a mammogram every year.  Breast cancer gene (BRCA)-related cancer risk assessment is recommended for women who have family members with BRCA-related cancers. BRCA-related cancers include breast, ovarian, tubal, and peritoneal cancers. Having family members with  these cancers may be associated with an increased risk for harmful changes (mutations) in the breast cancer genes BRCA1 and BRCA2. Results of the assessment will determine the need for genetic counseling and BRCA1 and BRCA2 testing.  Your health care provider may recommend that you be screened regularly for cancer of the pelvic organs (ovaries, uterus, and vagina). This screening involves a pelvic examination, including checking for microscopic changes to the surface of your cervix (Pap test). You may be encouraged to have this screening done every 3 years, beginning at age 22.  For women ages 58-65, health care providers may recommend pelvic exams and Pap testing every 3 years, or they may recommend the Pap and pelvic exam, combined with testing for human papilloma virus (HPV), every 5 years. Some types of HPV increase your risk of cervical cancer. Testing for HPV may also be done on women of any age with unclear Pap test results.  Other health care providers may not recommend any screening for nonpregnant women who are considered  low risk for pelvic cancer and who do not have symptoms. Ask your health care provider if a screening pelvic exam is right for you.  If you have had past treatment for cervical cancer or a condition that could lead to cancer, you need Pap tests and screening for cancer for at least 20 years after your treatment. If Pap tests have been discontinued, your risk factors (such as having a new sexual partner) need to be reassessed to determine if screening should resume. Some women have medical problems that increase the chance of getting cervical cancer. In these cases, your health care provider may recommend more frequent screening and Pap tests.  Colorectal cancer can be detected and often prevented. Most routine colorectal cancer screening begins at the age of 89 years and continues through age 90 years. However, your health care provider may recommend screening at an earlier age  if you have risk factors for colon cancer. On a yearly basis, your health care provider may provide home test kits to check for hidden blood in the stool. Use of a small camera at the end of a tube, to directly examine the colon (sigmoidoscopy or colonoscopy), can detect the earliest forms of colorectal cancer. Talk to your health care provider about this at age 71, when routine screening begins. Direct exam of the colon should be repeated every 5-10 years through age 42 years, unless early forms of precancerous polyps or small growths are found.  People who are at an increased risk for hepatitis B should be screened for this virus. You are considered at high risk for hepatitis B if:  You were born in a country where hepatitis B occurs often. Talk with your health care provider about which countries are considered high risk.  Your parents were born in a high-risk country and you have not received a shot to protect against hepatitis B (hepatitis B vaccine).  You have HIV or AIDS.  You use needles to inject street drugs.  You live with, or have sex with, someone who has hepatitis B.  You get hemodialysis treatment.  You take certain medicines for conditions like cancer, organ transplantation, and autoimmune conditions.  Hepatitis C blood testing is recommended for all people born from [birth year] through 1965 and any individual with known risks for hepatitis C.  Practice safe sex. Use condoms and avoid high-risk sexual practices to reduce the spread of sexually transmitted infections (STIs). STIs include gonorrhea, chlamydia, syphilis, trichomonas, herpes, HPV, and human immunodeficiency virus (HIV). Herpes, HIV, and HPV are viral illnesses that have no cure. They can result in disability, cancer, and death.  You should be screened for sexually transmitted illnesses (STIs) including gonorrhea and chlamydia if:  You are sexually active and are younger than 24 years.  You are older than 24 years and  your health care provider tells you that you are at risk for this type of infection.  Your sexual activity has changed since you were last screened and you are at an increased risk for chlamydia or gonorrhea. Ask your health care provider if you are at risk.  If you are at risk of being infected with HIV, it is recommended that you take a prescription medicine daily to prevent HIV infection. This is called preexposure prophylaxis (PrEP). You are considered at risk if:  You are sexually active and do not regularly use condoms or know the HIV status of your partner(s).  You take drugs by injection.  You are sexually active with a  partner who has HIV.  Talk with your health care provider about whether you are at high risk of being infected with HIV. If you choose to begin PrEP, you should first be tested for HIV. You should then be tested every 3 months for as long as you are taking PrEP.  Osteoporosis is a disease in which the bones lose minerals and strength with aging. This can result in serious bone fractures or breaks. The risk of osteoporosis can be identified using a bone density scan. Women ages 38 years and over and women at risk for fractures or osteoporosis should discuss screening with their health care providers. Ask your health care provider whether you should take a calcium supplement or vitamin D to reduce the rate of osteoporosis.  Menopause can be associated with physical symptoms and risks. Hormone replacement therapy is available to decrease symptoms and risks. You should talk to your health care provider about whether hormone replacement therapy is right for you.  Use sunscreen. Apply sunscreen liberally and repeatedly throughout the day. You should seek shade when your shadow is shorter than you. Protect yourself by wearing long sleeves, pants, a wide-brimmed hat, and sunglasses year round, whenever you are outdoors.  Once a month, do a whole body skin exam, using a mirror to  look at the skin on your back. Tell your health care provider of new moles, moles that have irregular borders, moles that are larger than a pencil eraser, or moles that have changed in shape or color.  Stay current with required vaccines (immunizations).  Influenza vaccine. All adults should be immunized every year.  Tetanus, diphtheria, and acellular pertussis (Td, Tdap) vaccine. Pregnant women should receive 1 dose of Tdap vaccine during each pregnancy. The dose should be obtained regardless of the length of time since the last dose. Immunization is preferred during the 27th-36th week of gestation. An adult who has not previously received Tdap or who does not know her vaccine status should receive 1 dose of Tdap. This initial dose should be followed by tetanus and diphtheria toxoids (Td) booster doses every 10 years. Adults with an unknown or incomplete history of completing a 3-dose immunization series with Td-containing vaccines should begin or complete a primary immunization series including a Tdap dose. Adults should receive a Td booster every 10 years.  Varicella vaccine. An adult without evidence of immunity to varicella should receive 2 doses or a second dose if she has previously received 1 dose. Pregnant females who do not have evidence of immunity should receive the first dose after pregnancy. This first dose should be obtained before leaving the health care facility. The second dose should be obtained 4-8 weeks after the first dose.  Human papillomavirus (HPV) vaccine. Females aged 13-26 years who have not received the vaccine previously should obtain the 3-dose series. The vaccine is not recommended for use in pregnant females. However, pregnancy testing is not needed before receiving a dose. If a female is found to be pregnant after receiving a dose, no treatment is needed. In that case, the remaining doses should be delayed until after the pregnancy. Immunization is recommended for any  person with an immunocompromised condition through the age of 42 years if she did not get any or all doses earlier. During the 3-dose series, the second dose should be obtained 4-8 weeks after the first dose. The third dose should be obtained 24 weeks after the first dose and 16 weeks after the second dose.  Zoster vaccine. One  dose is recommended for adults aged 53 years or older unless certain conditions are present.  Measles, mumps, and rubella (MMR) vaccine. Adults born before 26 generally are considered immune to measles and mumps. Adults born in [birth year] or later should have 1 or more doses of MMR vaccine unless there is a contraindication to the vaccine or there is laboratory evidence of immunity to each of the three diseases. A routine second dose of MMR vaccine should be obtained at least 28 days after the first dose for students attending postsecondary schools, health care workers, or international travelers. People who received inactivated measles vaccine or an unknown type of measles vaccine during 1963-1967 should receive 2 doses of MMR vaccine. People who received inactivated mumps vaccine or an unknown type of mumps vaccine before 1979 and are at high risk for mumps infection should consider immunization with 2 doses of MMR vaccine. For females of childbearing age, rubella immunity should be determined. If there is no evidence of immunity, females who are not pregnant should be vaccinated. If there is no evidence of immunity, females who are pregnant should delay immunization until after pregnancy. Unvaccinated health care workers born before [birth year] who lack laboratory evidence of measles, mumps, or rubella immunity or laboratory confirmation of disease should consider measles and mumps immunization with 2 doses of MMR vaccine or rubella immunization with 1 dose of MMR vaccine.  Pneumococcal 13-valent conjugate (PCV13) vaccine. When indicated, a person who is uncertain of his immunization history  and has no record of immunization should receive the PCV13 vaccine. All adults 18 years of age and older should receive this vaccine. An adult aged 57 years or older who has certain medical conditions and has not been previously immunized should receive 1 dose of PCV13 vaccine. This PCV13 should be followed with a dose of pneumococcal polysaccharide (PPSV23) vaccine. Adults who are at high risk for pneumococcal disease should obtain the PPSV23 vaccine at least 8 weeks after the dose of PCV13 vaccine. Adults older than 52 years of age who have normal immune system function should obtain the PPSV23 vaccine dose at least 1 year after the dose of PCV13 vaccine.  Pneumococcal polysaccharide (PPSV23) vaccine. When PCV13 is also indicated, PCV13 should be obtained first. All adults aged 28 years and older should be immunized. An adult younger than age 11 years who has certain medical conditions should be immunized. Any person who resides in a nursing home or long-term care facility should be immunized. An adult smoker should be immunized. People with an immunocompromised condition and certain other conditions should receive both PCV13 and PPSV23 vaccines. People with human immunodeficiency virus (HIV) infection should be immunized as soon as possible after diagnosis. Immunization during chemotherapy or radiation therapy should be avoided. Routine use of PPSV23 vaccine is not recommended for American Indians, [place], or people younger than 65 years unless there are medical conditions that require PPSV23 vaccine. When indicated, people who have unknown immunization and have no record of immunization should receive PPSV23 vaccine. One-time revaccination 5 years after the first dose of PPSV23 is recommended for people aged 19-64 years who have chronic kidney failure, nephrotic syndrome, asplenia, or immunocompromised conditions. People who received 1-2 doses of PPSV23 before age 97 years should receive another dose  of PPSV23 vaccine at age 72 years or later if at least 5 years have passed since the previous dose. Doses of PPSV23 are not needed for people immunized with PPSV23 at or after age 25 years.  Meningococcal  vaccine. Adults with asplenia or persistent complement component deficiencies should receive 2 doses of quadrivalent meningococcal conjugate (MenACWY-D) vaccine. The doses should be obtained at least 2 months apart. Microbiologists working with certain meningococcal bacteria, [employer] recruits, people at risk during an outbreak, and people who travel to or live in countries with a high rate of meningitis should be immunized. A first-year college student up through age 58 years who is living in a residence hall should receive a dose if she did not receive a dose on or after her 16th birthday. Adults who have certain high-risk conditions should receive one or more doses of vaccine.  Hepatitis A vaccine. Adults who wish to be protected from this disease, have certain high-risk conditions, work with hepatitis A-infected animals, work in hepatitis A research labs, or travel to or work in countries with a high rate of hepatitis A should be immunized. Adults who were previously unvaccinated and who anticipate close contact with an international adoptee during the first 60 days after arrival in the [place] from a country with a high rate of hepatitis A should be immunized.  Hepatitis B vaccine. Adults who wish to be protected from this disease, have certain high-risk conditions, may be exposed to blood or other infectious body fluids, are household contacts or sex partners of hepatitis B positive people, are clients or workers in certain care facilities, or travel to or work in countries with a high rate of hepatitis B should be immunized.  Haemophilus influenzae type b (Hib) vaccine. A previously unvaccinated person with asplenia or sickle cell disease or having a scheduled splenectomy should receive 1 dose  of Hib vaccine. Regardless of previous immunization, a recipient of a hematopoietic stem cell transplant should receive a 3-dose series 6-12 months after her successful transplant. Hib vaccine is not recommended for adults with HIV infection. Preventive Services / Frequency Ages 21 to 6 years  Blood pressure check.** / Every 3-5 years.  Lipid and cholesterol check.** / Every 5 years beginning at age 37.  Clinical breast exam.** / Every 3 years for women in their 45s and 46s.  BRCA-related cancer risk assessment.** / For women who have family members with a BRCA-related cancer (breast, ovarian, tubal, or peritoneal cancers).  Pap test.** / Every 2 years from ages 69 through 4. Every 3 years starting at age 22 through age 26 or 40 with a history of 3 consecutive normal Pap tests.  HPV screening.** / Every 3 years from ages 43 through ages 79 to 12 with a history of 3 consecutive normal Pap tests.  Hepatitis C blood test.** / For any individual with known risks for hepatitis C.  Skin self-exam. / Monthly.  Influenza vaccine. / Every year.  Tetanus, diphtheria, and acellular pertussis (Tdap, Td) vaccine.** / Consult your health care provider. Pregnant women should receive 1 dose of Tdap vaccine during each pregnancy. 1 dose of Td every 10 years.  Varicella vaccine.** / Consult your health care provider. Pregnant females who do not have evidence of immunity should receive the first dose after pregnancy.  HPV vaccine. / 3 doses over 6 months, if 70 and younger. The vaccine is not recommended for use in pregnant females. However, pregnancy testing is not needed before receiving a dose.  Measles, mumps, rubella (MMR) vaccine.** / You need at least 1 dose of MMR if you were born in 1957 or later. You may also need a 2nd dose. For females of childbearing age, rubella immunity should be determined. If  there is no evidence of immunity, females who are not pregnant should be vaccinated. If there is  no evidence of immunity, females who are pregnant should delay immunization until after pregnancy.  Pneumococcal 13-valent conjugate (PCV13) vaccine.** / Consult your health care provider.  Pneumococcal polysaccharide (PPSV23) vaccine.** / 1 to 2 doses if you smoke cigarettes or if you have certain conditions.  Meningococcal vaccine.** / 1 dose if you are age 32 to 91 years and a Market researcher living in a residence hall, or have one of several medical conditions, you need to get vaccinated against meningococcal disease. You may also need additional booster doses.  Hepatitis A vaccine.** / Consult your health care provider.  Hepatitis B vaccine.** / Consult your health care provider.  Haemophilus influenzae type b (Hib) vaccine.** / Consult your health care provider. Ages 67 to 61 years  Blood pressure check.** / Every year.  Lipid and cholesterol check.** / Every 5 years beginning at age 49 years.  Lung cancer screening. / Every year if you are aged 61-80 years and have a 30-pack-year history of smoking and currently smoke or have quit within the past 15 years. Yearly screening is stopped once you have quit smoking for at least 15 years or develop a health problem that would prevent you from having lung cancer treatment.  Clinical breast exam.** / Every year after age 31 years.  BRCA-related cancer risk assessment.** / For women who have family members with a BRCA-related cancer (breast, ovarian, tubal, or peritoneal cancers).  Mammogram.** / Every year beginning at age 47 years and continuing for as long as you are in good health. Consult with your health care provider.  Pap test.** / Every 3 years starting at age 38 years through age 31 or 40 years with a history of 3 consecutive normal Pap tests.  HPV screening.** / Every 3 years from ages 23 years through ages 10 to 53 years with a history of 3 consecutive normal Pap tests.  Fecal occult blood test (FOBT) of stool. /  Every year beginning at age 36 years and continuing until age 61 years. You may not need to do this test if you get a colonoscopy every 10 years.  Flexible sigmoidoscopy or colonoscopy.** / Every 5 years for a flexible sigmoidoscopy or every 10 years for a colonoscopy beginning at age 45 years and continuing until age 10 years.  Hepatitis C blood test.** / For all people born from [birth year] through 1967 and any individual with known risks for hepatitis C.  Skin self-exam. / Monthly.  Influenza vaccine. / Every year.  Tetanus, diphtheria, and acellular pertussis (Tdap/Td) vaccine.** / Consult your health care provider. Pregnant women should receive 1 dose of Tdap vaccine during each pregnancy. 1 dose of Td every 10 years.  Varicella vaccine.** / Consult your health care provider. Pregnant females who do not have evidence of immunity should receive the first dose after pregnancy.  Zoster vaccine.** / 1 dose for adults aged 60 years or older.  Measles, mumps, rubella (MMR) vaccine.** / You need at least 1 dose of MMR if you were born in 1957 or later. You may also need a second dose. For females of childbearing age, rubella immunity should be determined. If there is no evidence of immunity, females who are not pregnant should be vaccinated. If there is no evidence of immunity, females who are pregnant should delay immunization until after pregnancy.  Pneumococcal 13-valent conjugate (PCV13) vaccine.** / Consult your health care provider.  Pneumococcal polysaccharide (PPSV23) vaccine.** / 1 to 2 doses if you smoke cigarettes or if you have certain conditions.  Meningococcal vaccine.** / Consult your health care provider.  Hepatitis A vaccine.** / Consult your health care provider.  Hepatitis B vaccine.** / Consult your health care provider.  Haemophilus influenzae type b (Hib) vaccine.** / Consult your health care provider. Ages 80 years and over  Blood pressure check.** / Every year.  Lipid  and cholesterol check.** / Every 5 years beginning at age 63 years.  Lung cancer screening. / Every year if you are aged 25-80 years and have a 30-pack-year history of smoking and currently smoke or have quit within the past 15 years. Yearly screening is stopped once you have quit smoking for at least 15 years or develop a health problem that would prevent you from having lung cancer treatment.  Clinical breast exam.** / Every year after age 71 years.  BRCA-related cancer risk assessment.** / For women who have family members with a BRCA-related cancer (breast, ovarian, tubal, or peritoneal cancers).  Mammogram.** / Every year beginning at age 20 years and continuing for as long as you are in good health. Consult with your health care provider.  Pap test.** / Every 3 years starting at age 10 years through age 83 or 53 years with 3 consecutive normal Pap tests. Testing can be stopped between 65 and 70 years with 3 consecutive normal Pap tests and no abnormal Pap or HPV tests in the past 10 years.  HPV screening.** / Every 3 years from ages 69 years through ages 74 or 95 years with a history of 3 consecutive normal Pap tests. Testing can be stopped between 65 and 70 years with 3 consecutive normal Pap tests and no abnormal Pap or HPV tests in the past 10 years.  Fecal occult blood test (FOBT) of stool. / Every year beginning at age 16 years and continuing until age 43 years. You may not need to do this test if you get a colonoscopy every 10 years.  Flexible sigmoidoscopy or colonoscopy.** / Every 5 years for a flexible sigmoidoscopy or every 10 years for a colonoscopy beginning at age 37 years and continuing until age 74 years.  Hepatitis C blood test.** / For all people born from [birth year] through 1967 and any individual with known risks for hepatitis C.  Osteoporosis screening.** / A one-time screening for women ages 57 years and over and women at risk for fractures or osteoporosis.  Skin  self-exam. / Monthly.  Influenza vaccine. / Every year.  Tetanus, diphtheria, and acellular pertussis (Tdap/Td) vaccine.** / 1 dose of Td every 10 years.  Varicella vaccine.** / Consult your health care provider.  Zoster vaccine.** / 1 dose for adults aged 81 years or older.  Pneumococcal 13-valent conjugate (PCV13) vaccine.** / Consult your health care provider.  Pneumococcal polysaccharide (PPSV23) vaccine.** / 1 dose for all adults aged 67 years and older.  Meningococcal vaccine.** / Consult your health care provider.  Hepatitis A vaccine.** / Consult your health care provider.  Hepatitis B vaccine.** / Consult your health care provider.  Haemophilus influenzae type b (Hib) vaccine.** / Consult your health care provider. ** Family history and personal history of risk and conditions may change your health care provider's recommendations.   This information is not intended to replace advice given to you by your health care provider. Make sure you discuss any questions you have with your health care provider.   Document Released: 11/29/2001 Document Revised:  10/24/2014 Document Reviewed: 02/28/2011 Elsevier Interactive Patient Education [employer].

## 2016-01-11 NOTE — Progress Notes (Signed)
Subjective:    Patient ID: Marissa Bowman, female    DOB: 03-10-1966, 50 y.o.   MRN: [id number]  HPI   Patient presents today for complete physical.   Immunizations:  tdap up to date Diet: could be better, does not cook regularly. Exercise: reports that she walks 3 days a week Colonoscopy: due Pap Smear: 10/15 Mammogram: due  HTN- reports that she has not taken HCTZ in 2 months.   BP Readings from Last 3 Encounters:  01/11/16 126/84  09/02/15 138/80  06/03/15 130/82       Review of Systems  Constitutional: Negative for unexpected weight change.  HENT: Positive for rhinorrhea.   Respiratory: Negative for cough and shortness of breath.   Gastrointestinal: Negative for nausea, diarrhea and constipation.  Genitourinary: Negative for dysuria and frequency.  Musculoskeletal: Negative for myalgias and arthralgias.  Skin: Negative for rash.  Neurological: Negative for headaches.  Hematological: Negative for adenopathy.       Past Medical History  Diagnosis Date  . Asthma     childhood  . Elevated blood pressure reading without diagnosis of hypertension     Social History   Social History  . Marital Status: Single    Spouse Name: N/A  . Number of Children: N/A  . Years of Education: N/A   Occupational History  . Not on file.   Social History Main Topics  . Smoking status: Never Smoker   . Smokeless tobacco: Never Used  . Alcohol Use: No  . Drug Use: Not on file  . Sexual Activity: Not on file   Other Topics Concern  . Not on file   Social History Narrative   1 son- age 59   Unemployed, plans to sub with the [employer]   Associated degree   Lives with mother father/father and son   Has 1 dog lab/spaniel mix   Enjoys reading    History reviewed. No pertinent past surgical history.  Family History  Problem Relation Age of Onset  . Hypertension Mother   . Hyperlipidemia Mother   . Diabetes Mother     No Known Allergies  Current Outpatient  Prescriptions on File Prior to Visit  Medication Sig Dispense Refill  . ferrous sulfate 325 (65 FE) MG tablet Take 325 mg by mouth daily with breakfast.     . lisinopril (PRINIVIL,ZESTRIL) 10 MG tablet TAKE 1 TABLET (10 MG TOTAL) BY MOUTH DAILY. 30 tablet 5  . potassium chloride SA (K-DUR,KLOR-CON) 20 MEQ tablet Take 1 tablet (20 mEq total) by mouth daily. 30 tablet 5   No current facility-administered medications on file prior to visit.    BP 126/84 mmHg  Pulse 63  Temp(Src) 98 F (36.7 C) (Oral)  Resp 18  Ht 5\' 4"  (1.626 m)  Wt 309 lb (140.161 kg)  BMI 53.01 kg/m2  SpO2 98%  LMP 08/18/2015    Objective:   Physical Exam  Constitutional: She is oriented to person, place, and time. She appears well-developed and well-nourished. No distress.  HENT:  Head: Normocephalic.  Right Ear: Tympanic membrane and ear canal normal.  Left Ear: Tympanic membrane and ear canal normal.  Mouth/Throat: No posterior oropharyngeal edema or posterior oropharyngeal erythema.  Eyes: Conjunctivae are normal. Pupils are equal, round, and reactive to light.  Neck: Normal range of motion.  Cardiovascular: Normal rate.   Pulmonary/Chest: Effort normal. No respiratory distress. She has no wheezes. She has no rales.  Abdominal: Soft. Bowel sounds are normal.  Musculoskeletal: Normal  range of motion.  Lymphadenopathy:    She has no cervical adenopathy.  Neurological: She is alert and oriented to person, place, and time.  Reflex Scores:      Patellar reflexes are 2+ on the right side and 2+ on the left side. Skin: Skin is warm and dry.  Breast:  Bilateral breast exam is normal without masses.         Assessment & Plan:  Preventative Care- discussed healthy diet, exercise and weight loss.  Refer for mammogram, colonoscopy.  Obtain routine lab work.    EKG tracing is personally reviewed- shows NSR, appears unchanged compared to 2016 EKG on file.

## 2016-01-11 NOTE — Progress Notes (Signed)
Pre visit review using our clinic review tool, if applicable. No additional management support is needed unless otherwise documented below in the visit note. 

## 2016-01-14 ENCOUNTER — Encounter: Payer: Self-pay | Admitting: Family

## 2016-07-11 ENCOUNTER — Ambulatory Visit (INDEPENDENT_AMBULATORY_CARE_PROVIDER_SITE_OTHER): Payer: Self-pay | Admitting: Family

## 2016-07-11 ENCOUNTER — Encounter: Payer: Self-pay | Admitting: Family

## 2016-07-11 VITALS — BP 134/92 | HR 60 | Temp 97.7°F | Resp 16 | Ht 64.0 in | Wt 249.2 lb

## 2016-07-11 DIAGNOSIS — I10 Essential (primary) hypertension: Secondary | ICD-10-CM

## 2016-07-11 DIAGNOSIS — D509 Iron deficiency anemia, unspecified: Secondary | ICD-10-CM

## 2016-07-11 DIAGNOSIS — R200 Anesthesia of skin: Secondary | ICD-10-CM

## 2016-07-11 DIAGNOSIS — L02818 Cutaneous abscess of other sites: Secondary | ICD-10-CM

## 2016-07-11 DIAGNOSIS — R208 Other disturbances of skin sensation: Secondary | ICD-10-CM

## 2016-07-11 DIAGNOSIS — D649 Anemia, unspecified: Secondary | ICD-10-CM

## 2016-07-11 DIAGNOSIS — Z0000 Encounter for general adult medical examination without abnormal findings: Secondary | ICD-10-CM

## 2016-07-11 LAB — CBC WITH DIFFERENTIAL/PLATELET
BASOS PCT: 0.5 % (ref 0.0–3.0)
Basophils Absolute: 0 10*3/uL (ref 0.0–0.1)
EOS ABS: 0.3 10*3/uL (ref 0.0–0.7)
Eosinophils Relative: 4.2 % (ref 0.0–5.0)
HCT: 36.6 % (ref 36.0–46.0)
HEMOGLOBIN: 12.3 g/dL (ref 12.0–15.0)
Lymphocytes Relative: 21.8 % (ref 12.0–46.0)
Lymphs Abs: 1.4 10*3/uL (ref 0.7–4.0)
MCHC: 33.7 g/dL (ref 30.0–36.0)
MCV: 79.1 fl (ref 78.0–100.0)
MONO ABS: 0.4 10*3/uL (ref 0.1–1.0)
Monocytes Relative: 5.6 % (ref 3.0–12.0)
Neutro Abs: 4.5 10*3/uL (ref 1.4–7.7)
Neutrophils Relative %: 67.9 % (ref 43.0–77.0)
Platelets: 323 10*3/uL (ref 150.0–400.0)
RBC: 4.63 Mil/uL (ref 3.87–5.11)
RDW: 14.9 % (ref 11.5–15.5)
WBC: 6.6 10*3/uL (ref 4.0–10.5)

## 2016-07-11 LAB — BASIC METABOLIC PANEL
BUN: 11 mg/dL (ref 6–23)
CO2: 28 mEq/L (ref 19–32)
CREATININE: 0.69 mg/dL (ref 0.40–1.20)
Calcium: 8.6 mg/dL (ref 8.4–10.5)
Chloride: 103 mEq/L (ref 96–112)
GFR: 114.63 mL/min (ref >60.00)
Glucose, Bld: 88 mg/dL (ref 70–99)
Potassium: 3.9 mEq/L (ref 3.5–5.1)
Sodium: 140 mEq/L (ref 135–145)

## 2016-07-11 LAB — FERRITIN: Ferritin: 236.6 ng/mL (ref 10.0–291.0)

## 2016-07-11 LAB — IRON: IRON: 91 ug/dL (ref 42–145)

## 2016-07-11 MED ORDER — MELOXICAM 7.5 MG PO TABS: 7.5000 mg | ORAL_TABLET | Freq: Every day | ORAL | 0 refills | Status: DC

## 2016-07-11 MED ORDER — CEPHALEXIN 500 MG PO CAPS: 500.0000 mg | ORAL_CAPSULE | Freq: Three times a day (TID) | ORAL | 0 refills | Status: DC

## 2016-07-11 NOTE — Progress Notes (Signed)
Pre visit review using our clinic review tool, if applicable. No additional management support is needed unless otherwise documented below in the visit note. 

## 2016-07-11 NOTE — Progress Notes (Signed)
Subjective:    Patient ID: Marissa Bowman, female    DOB: [birth year], 50 y.o.   MRN: [id number]  HPI   [name] is a  50 yr old female who presents today for follow up.  HTN- She continues lisinopril.   BP Readings from Last 3 Encounters:  07/11/16 (!) 134/92  01/11/16 126/84  09/02/15 138/80   Iron deficiency Anemia- reports compliance with daily iron supplement.  Lab Results  Component Value Date   WBC 6.8 01/11/2016   HGB 12.3 01/11/2016   HCT 38.2 01/11/2016   MCV 79.3 01/11/2016   PLT 241.0 01/11/2016   Boil Right groin- began a few days ago. Has drained some.  Applying warm wash cloth bid.   Right leg numbness- right lateral thigh is numb for the last few days. Has had some back pain in the past. None currently. Occasional pain in the right thigh. Symptoms present for a few days.   Standing makes the numbness worse. Denies current low back pain.     Review of Systems    see HPI  Past Medical History:  Diagnosis Date  . Asthma    childhood  . Elevated blood pressure reading without diagnosis of hypertension      Social History   Social History  . Marital status: Single    Spouse name: N/A  . Number of children: N/A  . Years of education: N/A   Occupational History  . Not on file.   Social History Main Topics  . Smoking status: Never Smoker  . Smokeless tobacco: Never Used  . Alcohol use No  . Drug use: Unknown  . Sexual activity: Not on file   Other Topics Concern  . Not on file   Social History Narrative   1 son- age 19   Unemployed, plans to sub with the [employer]   Associated degree   Lives with mother father/father and son   Has 1 dog lab/spaniel mix   Enjoys reading    No past surgical history on file.  Family History  Problem Relation Age of Onset  . Hypertension Mother   . Hyperlipidemia Mother   . Diabetes Mother     No Known Allergies  Current Outpatient Prescriptions on File Prior to Visit  Medication Sig Dispense  Refill  . ferrous sulfate 325 (65 FE) MG tablet Take 325 mg by mouth daily with breakfast.     . lisinopril (PRINIVIL,ZESTRIL) 10 MG tablet TAKE 1 TABLET (10 MG TOTAL) BY MOUTH DAILY. 30 tablet 5   No current facility-administered medications on file prior to visit.     BP (!) 134/92 (BP Location: Right Arm, Patient Position: Sitting, Cuff Size: Large)   Pulse 60   Temp 97.7 F (36.5 C) (Oral)   Resp 16   Ht 5\' 4"  (1.626 m)   Wt 249 lb 3.2 oz (113 kg)   SpO2 100%   BMI 42.78 kg/m    Objective:   Physical Exam  Constitutional: She appears well-developed and well-nourished.  Cardiovascular: Normal rate, regular rhythm and normal heart sounds.   No murmur heard. Pulmonary/Chest: Effort normal and breath sounds normal. No respiratory distress. She has no wheezes.  Musculoskeletal:       Cervical back: She exhibits no tenderness.       Thoracic back: She exhibits no tenderness.       Lumbar back: She exhibits no tenderness.  Neurological:  Reflex Scores:      Patellar reflexes are  1+ on the right side and 1+ on the left side. Patellar reflexes difficult due to habitus. Bilateral LE strength is 5/5  Skin: Skin is warm and dry.  Right inner thigh- + pea sized area of fluctuance with small opening in the middle.    Psychiatric: She has a normal mood and affect. Her behavior is normal. Judgment and thought content normal.          Assessment & Plan:  Right anterior thigh numbness- trial of meloxicam. Consider imaging of lumbar spine if symptoms worsen or do not improve.    Abscess- small, draining- will rx with keflex. Continue bid compresses. She is instructed to call if boil worsens or does not improve over the next week.   Had flu shot at work.

## 2016-07-11 NOTE — Assessment & Plan Note (Signed)
BP reasonable. DBP a touch high. If still high in 1 month at her follow up, consider increase lisinopril to 20mg . I don't think she should continue to need kdur off of hctz.  Will d/c.

## 2016-07-11 NOTE — Assessment & Plan Note (Signed)
Clinically stable, obtain cbc /serum iron. Continue iron supplement.

## 2016-07-11 NOTE — Patient Instructions (Signed)
Please complete lab work prior to leaving. Stop potassium.  We will let you know if you need to continue. Begin meloxicam once daily for 2 weeks to see if it helps with your right leg numbness.  Let us know if you develop right leg weakness/bowel or bladder incontinence.

## 2016-08-08 ENCOUNTER — Ambulatory Visit: Payer: Self-pay | Admitting: Family

## 2016-08-08 ENCOUNTER — Encounter: Payer: Self-pay | Admitting: Family

## 2016-08-08 ENCOUNTER — Ambulatory Visit (INDEPENDENT_AMBULATORY_CARE_PROVIDER_SITE_OTHER): Payer: Self-pay | Admitting: Family

## 2016-08-08 DIAGNOSIS — I10 Essential (primary) hypertension: Secondary | ICD-10-CM

## 2016-08-08 DIAGNOSIS — D509 Iron deficiency anemia, unspecified: Secondary | ICD-10-CM

## 2016-08-08 DIAGNOSIS — Z6841 Body Mass Index (BMI) 40.0 and over, adult: Secondary | ICD-10-CM

## 2016-08-08 MED ORDER — LISINOPRIL 20 MG PO TABS: 20.0000 mg | ORAL_TABLET | Freq: Every day | ORAL | 3 refills | Status: DC

## 2016-08-08 NOTE — Assessment & Plan Note (Signed)
Discussed exercise and weight loss 

## 2016-08-08 NOTE — Assessment & Plan Note (Signed)
Plan to repeat iron/cbc next visit.

## 2016-08-08 NOTE — Progress Notes (Signed)
   Subjective:    Patient ID: Marissa Bowman, female    DOB: 1966/09/05, 50 y.o.   MRN: [id number]  HPI  Marissa Bowman is a 50 yr old female who presents today for follow up.  1) HTN- maintained on on lisinopril daily.  Denies SOB/CP or swelling.  BP Readings from Last 3 Encounters:  08/08/16 (!) 160/90  07/11/16 (!) 134/92  01/11/16 126/84   2) Anemia- She continues iron supplement. Uses stool softner prn constipation. Lab Results  Component Value Date   WBC 6.6 07/11/2016   HGB 12.3 07/11/2016   HCT 36.6 07/11/2016   MCV 79.1 07/11/2016   PLT 323.0 07/11/2016   3) morbid obesity- walks regularly Wt Readings from Last 3 Encounters:  08/08/16 (!) 304 lb 9.6 oz (138.2 kg)  07/11/16 249 lb 3.2 oz (113 kg)  01/11/16 (!) 309 lb (140.2 kg)    Review of Systems    see HPI  Past Medical History:  Diagnosis Date  . Asthma    childhood  . Elevated blood pressure reading without diagnosis of hypertension      Social History   Social History  . Marital status: Single    Spouse name: N/A  . Number of children: N/A  . Years of education: N/A   Occupational History  . Not on file.   Social History Main Topics  . Smoking status: Never Smoker  . Smokeless tobacco: Never Used  . Alcohol use No  . Drug use: Unknown  . Sexual activity: Not on file   Other Topics Concern  . Not on file   Social History Narrative   1 son- age 52   Unemployed, plans to sub with the [employer]   Associated degree   Lives with mother father/father and son   Has 1 dog lab/spaniel mix   Enjoys reading    No past surgical history on file.  Family History  Problem Relation Age of Onset  . Hypertension Mother   . Hyperlipidemia Mother   . Diabetes Mother     No Known Allergies  Current Outpatient Prescriptions on File Prior to Visit  Medication Sig Dispense Refill  . ferrous sulfate 325 (65 FE) MG tablet Take 325 mg by mouth daily with breakfast.     . lisinopril (PRINIVIL,ZESTRIL)  10 MG tablet TAKE 1 TABLET (10 MG TOTAL) BY MOUTH DAILY. 30 tablet 5   No current facility-administered medications on file prior to visit.     BP (!) 160/90 (BP Location: Right Arm, Cuff Size: Large)   Pulse 67   Temp 97.8 F (36.6 C) (Oral)   Resp 16   Ht 5\' 4"  (1.626 m)   Wt (!) 304 lb 9.6 oz (138.2 kg)   SpO2 97% Comment: room air  BMI 52.28 kg/m    Objective:   Physical Exam  Constitutional: She is oriented to person, place, and time. She appears well-developed and well-nourished.  HENT:  Head: Normocephalic and atraumatic.  Cardiovascular: Normal rate, regular rhythm and normal heart sounds.   No murmur heard. Pulmonary/Chest: Effort normal and breath sounds normal. No respiratory distress. She has no wheezes.  Musculoskeletal: She exhibits no edema.  Neurological: She is alert and oriented to person, place, and time.  Psychiatric: She has a normal mood and affect. Her behavior is normal. Judgment and thought content normal.          Assessment & Plan:

## 2016-08-08 NOTE — Patient Instructions (Signed)
Please call [facility] at [phone] to schedule your colonoscopy consult.

## 2016-08-08 NOTE — Assessment & Plan Note (Signed)
Uncontrolled, increase lisinopril from 10mg  to 20mg .

## 2016-08-10 ENCOUNTER — Encounter: Payer: Self-pay | Admitting: Gastroenterology

## 2016-08-22 ENCOUNTER — Encounter: Payer: Self-pay | Admitting: Family

## 2016-08-22 ENCOUNTER — Ambulatory Visit (INDEPENDENT_AMBULATORY_CARE_PROVIDER_SITE_OTHER): Payer: Self-pay | Admitting: Family

## 2016-08-22 VITALS — BP 150/85 | HR 58 | Temp 98.1°F | Resp 18 | Ht 64.0 in | Wt 303.8 lb

## 2016-08-22 DIAGNOSIS — Z1231 Encounter for screening mammogram for malignant neoplasm of breast: Secondary | ICD-10-CM

## 2016-08-22 DIAGNOSIS — I10 Essential (primary) hypertension: Secondary | ICD-10-CM

## 2016-08-22 DIAGNOSIS — Z1239 Encounter for other screening for malignant neoplasm of breast: Secondary | ICD-10-CM

## 2016-08-22 LAB — BASIC METABOLIC PANEL
BUN: 8 mg/dL (ref 6–23)
CALCIUM: 8.7 mg/dL (ref 8.4–10.5)
CO2: 29 mEq/L (ref 19–32)
Chloride: 104 mEq/L (ref 96–112)
Creatinine, Ser: 0.62 mg/dL (ref 0.40–1.20)
GFR: 129.63 mL/min (ref >60.00)
GLUCOSE: 93 mg/dL (ref 70–99)
Potassium: 3.7 mEq/L (ref 3.5–5.1)
Sodium: 139 mEq/L (ref 135–145)

## 2016-08-22 MED ORDER — AMLODIPINE BESYLATE 5 MG PO TABS: 5.0000 mg | ORAL_TABLET | Freq: Every day | ORAL | 3 refills | Status: DC

## 2016-08-22 NOTE — Progress Notes (Signed)
   Subjective:    Patient ID: Marissa Bowman, female    DOB: 10/29/1965, 51 y.o.   MRN: [id number]  HPI  Marissa Bowman is a 50 yr old female who presents today for follow up of her hypertension. Last visit, her lisinopril dose was increased from 10mg  to 20mg .  Denies CP/SOB or swelling. Reports good med compliance.   BP Readings from Last 3 Encounters:  08/22/16 (!) 160/79  08/08/16 (!) 160/90  07/11/16 (!) 134/92      Review of Systems See HPI  Past Medical History:  Diagnosis Date  . Asthma    childhood  . Elevated blood pressure reading without diagnosis of hypertension      Social History   Social History  . Marital status: Single    Spouse name: N/A  . Number of children: N/A  . Years of education: N/A   Occupational History  . Not on file.   Social History Main Topics  . Smoking status: Never Smoker  . Smokeless tobacco: Never Used  . Alcohol use No  . Drug use: Unknown  . Sexual activity: Not on file   Other Topics Concern  . Not on file   Social History Narrative   1 son- age 53   Unemployed, plans to sub with the [employer]   Associated degree   Lives with mother father/father and son   Has 1 dog lab/spaniel mix   Enjoys reading    History reviewed. No pertinent surgical history.  Family History  Problem Relation Age of Onset  . Hypertension Mother   . Hyperlipidemia Mother   . Diabetes Mother     No Known Allergies  Current Outpatient Prescriptions on File Prior to Visit  Medication Sig Dispense Refill  . ferrous sulfate 325 (65 FE) MG tablet Take 325 mg by mouth daily with breakfast.     . lisinopril (PRINIVIL,ZESTRIL) 20 MG tablet Take 1 tablet (20 mg total) by mouth daily. 30 tablet 3   No current facility-administered medications on file prior to visit.     BP (!) 160/79 (BP Location: Right Arm, Patient Position: Sitting, Cuff Size: Large)   Pulse (!) 58   Temp 98.1 F (36.7 C) (Oral)   Resp 18   Ht 5\' 4"  (1.626 m)   Wt (!)  303 lb 12.8 oz (137.8 kg)   LMP 08/21/2016 (Exact Date) Comment: First menstrual cycle since May 2017.  SpO2 100% Comment: RA  BMI 52.15 kg/m       Objective:   Physical Exam  Constitutional: She is oriented to person, place, and time. She appears well-developed and well-nourished.  HENT:  Head: Normocephalic and atraumatic.  Cardiovascular: Normal rate, regular rhythm and normal heart sounds.   No murmur heard. Pulmonary/Chest: Effort normal and breath sounds normal. No respiratory distress. She has no wheezes.  Neurological: She is alert and oriented to person, place, and time.  Skin: Skin is warm and dry.  Psychiatric: She has a normal mood and affect. Her behavior is normal. Judgment and thought content normal.          Assessment & Plan:

## 2016-08-22 NOTE — Assessment & Plan Note (Signed)
Uncontrolled. Continue current dose of lisinopril, add amlodipine 5mg .

## 2016-08-22 NOTE — Progress Notes (Signed)
Pre visit review using our clinic review tool, if applicable. No additional management support is needed unless otherwise documented below in the visit note. 

## 2016-08-22 NOTE — Patient Instructions (Signed)
Conitnue lisinopril 20mg  once daily. Begin amlodipine 5 mg once daily.

## 2016-08-27 ENCOUNTER — Ambulatory Visit (HOSPITAL_BASED_OUTPATIENT_CLINIC_OR_DEPARTMENT_OTHER)
Admission: RE | Admit: 2016-08-27 | Discharge: 2016-08-27 | Disposition: A | Discharge status: Home / Self Care | Payer: Self-pay | Source: Ambulatory Visit | Attending: Family | Admitting: Family

## 2016-08-27 DIAGNOSIS — Z1239 Encounter for other screening for malignant neoplasm of breast: Secondary | ICD-10-CM

## 2016-08-27 DIAGNOSIS — Z1231 Encounter for screening mammogram for malignant neoplasm of breast: Secondary | ICD-10-CM | POA: Insufficient documentation

## 2016-09-06 ENCOUNTER — Encounter: Payer: Self-pay | Admitting: *Deleted

## 2016-09-06 ENCOUNTER — Ambulatory Visit (INDEPENDENT_AMBULATORY_CARE_PROVIDER_SITE_OTHER): Payer: Self-pay | Admitting: Family

## 2016-09-06 VITALS — BP 168/90 | HR 60

## 2016-09-06 DIAGNOSIS — I10 Essential (primary) hypertension: Secondary | ICD-10-CM

## 2016-09-06 MED ORDER — AMLODIPINE BESYLATE 10 MG PO TABS: 10.0000 mg | ORAL_TABLET | Freq: Every day | ORAL | 0 refills | Status: DC

## 2016-09-06 MED ORDER — AMLODIPINE BESYLATE 10 MG PO TABS: 5.0000 mg | ORAL_TABLET | Freq: Every day | ORAL | 0 refills | Status: DC

## 2016-09-06 NOTE — Progress Notes (Signed)
Pre visit review using our clinic review tool, if applicable. No additional management support is needed unless otherwise documented below in the visit note.   Per 08/22/16 AVS: Return in about 2 weeks (around 09/05/2016) for nurse visit Blood pressure check.   Pt reports compliance w/ medications and has taken medications today. Last dose of amlodipine and  lisinopril about 1 hr ago.   Per [name], NP: Increase amlodipine to 10 mg daily. Return for nurse visit BP check in 2 weeks.   Pt notified of instructions and verbalized understanding. Medication filled to pharmacy as requested.    Next appointment: 09/21/16    Dorrene German, RN

## 2016-09-06 NOTE — Progress Notes (Signed)
Noted and agree. 

## 2016-09-06 NOTE — Patient Instructions (Signed)
Per [name], NP: Increase amlodipine to 10 mg daily. Return for nurse visit BP check in 2 weeks.

## 2016-09-21 ENCOUNTER — Ambulatory Visit (INDEPENDENT_AMBULATORY_CARE_PROVIDER_SITE_OTHER): Payer: Self-pay | Admitting: Family

## 2016-09-21 VITALS — BP 122/63 | HR 72

## 2016-09-21 DIAGNOSIS — I10 Essential (primary) hypertension: Secondary | ICD-10-CM

## 2016-09-21 NOTE — Progress Notes (Signed)
Pre visit review using our clinic tool,if applicable. No additional management support is needed unless otherwise documented below in the visit note.   Patient in for BP check per order from M. O'sullivan.  BP= 122/63 P=72

## 2016-09-21 NOTE — Progress Notes (Signed)
BP stable, continue current medications, follow up in 3 months.

## 2016-09-27 ENCOUNTER — Telehealth: Payer: Self-pay

## 2016-09-27 ENCOUNTER — Other Ambulatory Visit: Payer: Self-pay

## 2016-09-27 DIAGNOSIS — Z1211 Encounter for screening for malignant neoplasm of colon: Secondary | ICD-10-CM

## 2016-09-27 NOTE — Telephone Encounter (Signed)
BMI is >50.  Her colonoscopy must be moved to the hospital. I left a message to call about this asap.

## 2016-10-07 ENCOUNTER — Encounter: Payer: Self-pay | Admitting: Gastroenterology

## 2016-10-12 ENCOUNTER — Other Ambulatory Visit: Payer: Self-pay | Admitting: Family

## 2016-10-25 ENCOUNTER — Other Ambulatory Visit: Payer: Self-pay | Admitting: Family

## 2016-11-08 ENCOUNTER — Ambulatory Visit (AMBULATORY_SURGERY_CENTER): Payer: Self-pay

## 2016-11-08 VITALS — Ht 64.0 in | Wt 307.6 lb

## 2016-11-08 DIAGNOSIS — Z1211 Encounter for screening for malignant neoplasm of colon: Secondary | ICD-10-CM

## 2016-11-08 MED ORDER — NA SULFATE-K SULFATE-MG SULF 17.5-3.13-1.6 GM/177ML PO SOLN: ORAL | 0 refills | Status: DC

## 2016-11-08 NOTE — Progress Notes (Signed)
Per pt, no allergies to soy or egg products.Pt not taking any weight loss meds or using  O2 at home. 

## 2016-11-11 ENCOUNTER — Telehealth: Payer: Self-pay | Admitting: Gastroenterology

## 2016-11-11 NOTE — Telephone Encounter (Signed)
Not notified that we will try to get her a sample of [place].

## 2016-11-14 ENCOUNTER — Telehealth: Payer: Self-pay | Admitting: Gastroenterology

## 2016-11-14 NOTE — Telephone Encounter (Signed)
Called patient. No answer. Left message, Suprep (lot # [id number], exp. 9/19) sample left at front desk 4 th floor for patient to pick up.

## 2016-11-15 ENCOUNTER — Telehealth: Payer: Self-pay | Admitting: Gastroenterology

## 2016-11-15 NOTE — Telephone Encounter (Signed)
Called patient to inform that prep was sent to preferred pharmacy [facility]    She will call them to verify they have it

## 2016-11-16 ENCOUNTER — Telehealth: Payer: Self-pay | Admitting: Gastroenterology

## 2016-11-16 NOTE — Telephone Encounter (Signed)
Called patient back, she stated [facility] did not have the Suprep prescription.  I called [facility] pharmacy and they said they misunderstood and thought she was looking for a new prescription   Her Suprep was going to cost $98 so I called in a copay card and she is getting it for $50. Pharmacy is going to contact the patient and inform her  They apologized for the misunderstanding

## 2016-11-22 ENCOUNTER — Ambulatory Visit (HOSPITAL_COMMUNITY)
Admission: RE | Admit: 2016-11-22 | Discharge: 2016-11-22 | Disposition: A | Discharge status: Home / Self Care | Payer: Self-pay | Source: Ambulatory Visit | Attending: Gastroenterology | Admitting: Gastroenterology

## 2016-11-22 ENCOUNTER — Ambulatory Visit (HOSPITAL_COMMUNITY): Payer: Self-pay | Admitting: Anesthesiology

## 2016-11-22 ENCOUNTER — Encounter (HOSPITAL_COMMUNITY): Payer: Self-pay | Admitting: *Deleted

## 2016-11-22 ENCOUNTER — Encounter: Payer: Self-pay | Admitting: Gastroenterology

## 2016-11-22 ENCOUNTER — Encounter (HOSPITAL_COMMUNITY)
Admission: RE | Disposition: A | Discharge status: Home / Self Care | Payer: Self-pay | Source: Ambulatory Visit | Attending: Gastroenterology

## 2016-11-22 DIAGNOSIS — Z1211 Encounter for screening for malignant neoplasm of colon: Secondary | ICD-10-CM

## 2016-11-22 DIAGNOSIS — Z79899 Other long term (current) drug therapy: Secondary | ICD-10-CM | POA: Insufficient documentation

## 2016-11-22 DIAGNOSIS — Z1212 Encounter for screening for malignant neoplasm of rectum: Secondary | ICD-10-CM

## 2016-11-22 DIAGNOSIS — D649 Anemia, unspecified: Secondary | ICD-10-CM | POA: Insufficient documentation

## 2016-11-22 DIAGNOSIS — I10 Essential (primary) hypertension: Secondary | ICD-10-CM | POA: Insufficient documentation

## 2016-11-22 DIAGNOSIS — Z6841 Body Mass Index (BMI) 40.0 and over, adult: Secondary | ICD-10-CM | POA: Insufficient documentation

## 2016-11-22 DIAGNOSIS — E669 Obesity, unspecified: Secondary | ICD-10-CM | POA: Insufficient documentation

## 2016-11-22 SURGERY — INVASIVE LAB ABORTED CASE
Anesthesia: Monitor Anesthesia Care

## 2016-11-22 MED ORDER — LIDOCAINE 2% (20 MG/ML) 5 ML SYRINGE
INTRAMUSCULAR | Status: AC
  Filled 2016-11-22: qty 5

## 2016-11-22 MED ORDER — PROPOFOL 10 MG/ML IV BOLUS
INTRAVENOUS | Status: DC | PRN
  Administered 2016-11-22: 40 mg via INTRAVENOUS

## 2016-11-22 MED ORDER — ONDANSETRON HCL 4 MG/2ML IJ SOLN
INTRAMUSCULAR | Status: DC | PRN
  Administered 2016-11-22: 4 mg via INTRAVENOUS

## 2016-11-22 MED ORDER — PROPOFOL 500 MG/50ML IV EMUL
INTRAVENOUS | Status: DC | PRN
  Administered 2016-11-22: 150 ug/kg/min via INTRAVENOUS

## 2016-11-22 MED ORDER — SODIUM CHLORIDE 0.9 % IV SOLN: INTRAVENOUS | Status: DC

## 2016-11-22 MED ORDER — LACTATED RINGERS IV SOLN
INTRAVENOUS | Status: DC
Start: 2016-11-22 — End: 2016-11-22
  Administered 2016-11-22: 1000 mL via INTRAVENOUS

## 2016-11-22 MED ORDER — PROPOFOL 10 MG/ML IV BOLUS
INTRAVENOUS | Status: AC
  Filled 2016-11-22: qty 60

## 2016-11-22 MED ORDER — LIDOCAINE 2% (20 MG/ML) 5 ML SYRINGE
INTRAMUSCULAR | Status: DC | PRN
  Administered 2016-11-22: 100 mg via INTRAVENOUS

## 2016-11-22 MED ORDER — ONDANSETRON HCL 4 MG/2ML IJ SOLN
INTRAMUSCULAR | Status: AC
  Filled 2016-11-22: qty 2

## 2016-11-22 SURGICAL SUPPLY — 22 items

## 2016-11-22 NOTE — H&P (Signed)
[facility] History and Physical   Primary Care Physician:  [name]., NP   Reason for Procedure:  Screening for colorectal cancer Plan:    Colonoscopy with possible intervention    HPI: Marissa Bowman is a 51 y.o. female , morbidly obese here for screening colonoscopy. Denies any nausea, vomiting, abdominal pain, melena or bright red blood per rectum    Past Medical History:  Diagnosis Date  . Anemia    on iron  . Asthma    childhood  . Elevated blood pressure reading without diagnosis of hypertension     Past Surgical History:  Procedure Laterality Date  . NO PAST SURGERIES      Prior to Admission medications   Medication Sig Start Date End Date Taking? Authorizing Provider  amLODipine (NORVASC) 10 MG tablet TAKE ONE TABLET BY MOUTH ONE TIME DAILY 10/12/16  Yes [name], NP  ferrous sulfate 325 (65 FE) MG tablet Take 325 mg by mouth 2 (two) times daily with a meal.    Yes Historical Provider, MD  lisinopril (PRINIVIL,ZESTRIL) 20 MG tablet TAKE ONE TABLET BY MOUTH ONE TIME DAILY 10/25/16  Yes [name], NP  Na Sulfate-K Sulfate-Mg Sulf (SUPREP BOWEL PREP KIT) 17.5-3.13-1.6 GM/180ML SOLN Suprep as directed / no substitutions 11/08/16  Yes Mauri Pole, MD    Current Facility-Administered Medications  Medication Dose Route Frequency Provider Last Rate Last Dose  . 0.9 %  sodium chloride infusion   Intravenous Continuous Kavitha Nandigam V, MD      . lactated ringers infusion   Intravenous Continuous Harl Bowie V, MD 125 mL/hr at 11/22/16 0835 1,000 mL at 11/22/16 0835    Allergies as of 09/27/2016  . (No Known Allergies)    Family History  Problem Relation Age of Onset  . Hypertension Mother   . Hyperlipidemia Mother   . Diabetes Mother   . Lung cancer Father     Social History   Social History  . Marital status: Single    Spouse name: N/A  . Number of children: N/A  . Years of education: N/A    Occupational History  . Not on file.   Social History Main Topics  . Smoking status: Never Smoker  . Smokeless tobacco: Never Used  . Alcohol use No  . Drug use: No  . Sexual activity: Not on file   Other Topics Concern  . Not on file   Social History Narrative   1 son- age 50   Unemployed, plans to sub with the [employer]   Associated degree   Lives with mother father/father and son   Has 1 dog lab/spaniel mix   Enjoys reading    Review of Systems:  All other review of systems negative except as mentioned in the HPI.  Physical Exam: Vital signs in last 24 hours: Temp:  [98.4 F (36.9 C)] 98.4 F (36.9 C) (02/06 0829) Pulse Rate:  [57] 57 (02/06 0829) Resp:  [16] 16 (02/06 0829) BP: (190)/(73) 190/73 (02/06 0829) SpO2:  [100 %] 100 % (02/06 0829) Weight:  [305 lb (138.3 kg)] 305 lb (138.3 kg) (02/06 0829)  Body mass index is 52.35 kg/m.    General:   Alert,  Well-developed, well-nourished, pleasant and cooperative in NAD Lungs:  Clear throughout to auscultation.   Heart:  Regular rate and rhythm; no murmurs, clicks, rubs,  or gallops. Abdomen:  Soft, nontender and nondistended. Normal bowel sounds.   Neuro/Psych:  Alert and cooperative. Normal mood and  affect. A and O x 3   _0 .Denzil Magnuson, MD [phone] Mon-Fri 8a-5p [phone] after 5p, weekends, holidays 11/22/2016 8:51 AM@

## 2016-11-22 NOTE — Transfer of Care (Signed)
Immediate Anesthesia Transfer of Care Note  Patient: Marissa Bowman  Procedure(s) Performed: Procedure(s): COLONOSCOPY WITH PROPOFOL (N/A)  Patient Location: PACU  Anesthesia Type:MAC  Level of Consciousness:  sedated, patient cooperative and responds to stimulation  Airway & Oxygen Therapy:Patient Spontanous Breathing and Patient connected to face mask oxgen  Post-op Assessment:  Report given to PACU RN and Post -op Vital signs reviewed and stable  Post vital signs:  Reviewed and stable  Last Vitals:  Vitals:   11/22/16 0829  BP: (!) 190/73  Pulse: (!) 57  Resp: 16  Temp: 79.0 C    Complications: No apparent anesthesia complications

## 2016-11-22 NOTE — Anesthesia Preprocedure Evaluation (Signed)
Anesthesia Evaluation  Patient identified by MRN, date of birth, ID band Patient awake    Reviewed: Allergy & Precautions, NPO status , Patient's Chart, lab work & pertinent test results  Airway Mallampati: II  TM Distance: >3 FB Neck ROM: Full    Dental no notable dental hx.    Pulmonary neg pulmonary ROS,    Pulmonary exam normal breath sounds clear to auscultation       Cardiovascular hypertension, Pt. on medications Normal cardiovascular exam Rhythm:Regular Rate:Normal     Neuro/Psych negative neurological ROS  negative psych ROS   GI/Hepatic negative GI ROS, Neg liver ROS,   Endo/Other  Morbid obesity  Renal/GU negative Renal ROS  negative genitourinary   Musculoskeletal negative musculoskeletal ROS (+)   Abdominal   Peds negative pediatric ROS (+)  Hematology negative hematology ROS (+)   Anesthesia Other Findings   Reproductive/Obstetrics negative OB ROS                             Anesthesia Physical Anesthesia Plan  ASA: III  Anesthesia Plan: MAC   Post-op Pain Management:    Induction:   Airway Management Planned: Simple Face Mask  Additional Equipment:   Intra-op Plan:   Post-operative Plan:   Informed Consent: I have reviewed the patients History and Physical, chart, labs and discussed the procedure including the risks, benefits and alternatives for the proposed anesthesia with the patient or authorized representative who has indicated his/her understanding and acceptance.   Dental advisory given  Plan Discussed with: CRNA  Anesthesia Plan Comments:         Anesthesia Quick Evaluation

## 2016-11-22 NOTE — Anesthesia Postprocedure Evaluation (Signed)
Anesthesia Post Note  Patient: Marissa Bowman  Procedure(s) Performed: Procedure(s): Aborted Colonoscopy  Patient location during evaluation: Endoscopy Anesthesia Type: MAC Level of consciousness: awake and alert Pain management: pain level controlled Vital Signs Assessment: post-procedure vital signs reviewed and stable Respiratory status: spontaneous breathing, nonlabored ventilation, respiratory function stable and patient connected to nasal cannula oxygen Cardiovascular status: stable and blood pressure returned to baseline Anesthetic complications: no       Last Vitals:  Vitals:   11/22/16 0950 11/22/16 1000  BP: (!) 160/78 (!) 155/77  Pulse: 63 60  Resp: (!) 21 17  Temp:      Last Pain:  Vitals:   11/22/16 0935  TempSrc: Oral                 Montez Hageman

## 2016-11-22 NOTE — Discharge Instructions (Signed)
YOU HAD AN ENDOSCOPIC PROCEDURE TODAY: Refer to the procedure report and other information in the discharge instructions given to you for any specific questions about what was found during the examination. If this information does not answer your questions, please call [facility] office at [phone] to clarify.  ° °YOU SHOULD EXPECT: Some feelings of bloating in the abdomen. Passage of more gas than usual. Walking can help get rid of the air that was put into your GI tract during the procedure and reduce the bloating. If you had a lower endoscopy (such as a colonoscopy or flexible sigmoidoscopy) you may notice spotting of blood in your stool or on the toilet paper. Some abdominal soreness may be present for a day or two, also. ° °DIET: Your first meal following the procedure should be a light meal and then it is ok to progress to your normal diet. A half-sandwich or bowl of soup is an example of a good first meal. Heavy or fried foods are harder to digest and may make you feel nauseous or bloated. Drink plenty of fluids but you should avoid alcoholic beverages for 24 hours. If you had a esophageal dilation, please see attached instructions for diet.   ° °ACTIVITY: Your care partner should take you home directly after the procedure. You should plan to take it easy, moving slowly for the rest of the day. You can resume normal activity the day after the procedure however YOU SHOULD NOT DRIVE, use power tools, machinery or perform tasks that involve climbing or major physical exertion for 24 hours (because of the sedation medicines used during the test).  ° °SYMPTOMS TO REPORT IMMEDIATELY: °A gastroenterologist can be reached at any hour. Please call [phone]  for any of the following symptoms:  °Following lower endoscopy (colonoscopy, flexible sigmoidoscopy) °Excessive amounts of blood in the stool  °Significant tenderness, worsening of abdominal pains  °Swelling of the abdomen that is new, acute  °Fever of 100° or  higher  °Following upper endoscopy (EGD, EUS, ERCP, esophageal dilation) °Vomiting of blood or coffee ground material  °New, significant abdominal pain  °New, significant chest pain or pain under the shoulder blades  °Painful or persistently difficult swallowing  °New shortness of breath  °Black, tarry-looking or red, bloody stools ° °FOLLOW UP:  °If any biopsies were taken you will be contacted by phone or by letter within the next 1-3 weeks. Call [phone]  if you have not heard about the biopsies in 3 weeks.  °Please also call with any specific questions about appointments or follow up tests. ° °

## 2016-11-22 NOTE — Op Note (Addendum)
[facility] Patient Name: Marissa Bowman Procedure Date: 11/22/2016 MRN: [id number] Attending MD: Mauri Pole , MD Date of Birth: 04-12-1966 CSN: [id number] Age: 51 Admit Type: Outpatient Procedure:                Colonoscopy Indications:              Screening for colorectal malignant neoplasm, This                            is the patient's first colonoscopy Providers:                Mauri Pole, MD, Laverta Baltimore RN, RN,                            Cletis Athens, Technician Referring MD:              Medicines:                Monitored Anesthesia Care Complications:            No immediate complications. Estimated Blood Loss:     Estimated blood loss: none. Procedure:                Pre-Anesthesia Assessment:                           - Prior to the procedure, a History and Physical                            was performed, and patient medications and                            allergies were reviewed. The patient's tolerance of                            previous anesthesia was also reviewed. The risks                            and benefits of the procedure and the sedation                            options and risks were discussed with the patient.                            All questions were answered, and informed consent                            was obtained. Prior Anticoagulants: The patient has                            taken no previous anticoagulant or antiplatelet                            agents. ASA Grade Assessment: III - A patient with  severe systemic disease. After reviewing the risks                            and benefits, the patient was deemed in                            satisfactory condition to undergo the procedure.                           After obtaining informed consent, the colonoscope                            was passed under direct vision. Throughout the   procedure, the patient's blood pressure, pulse, and                            oxygen saturations were monitored continuously. The                            EC-[id number]) scope was introduced through                            the anus and advanced to the the transverse colon                            for evaluation. This was the intended extent. The                            colonoscopy was technically difficult and complex                            due to poor bowel prep with stool present.                            Successful completion of the procedure was aided by                            lavage. The patient tolerated the procedure well.                            The quality of the bowel preparation was poor. The                            rectum was photographed. Scope In: Scope Out: Findings:      The perianal and digital rectal examinations were normal.      A large amount of semi-liquid solid stool was found in the rectum, in       the sigmoid colon, in the descending colon and in the transverse colon,       precluding visualization. Lavage of the area was performed, resulting in       incomplete clearance with continued poor visualization. Impression:               - Preparation of the colon was poor.                           -  Stool in the rectum, in the sigmoid colon, in the                            descending colon and in the transverse colon.                           - No specimens collected. Moderate Sedation:      N/A- Per Anesthesia Care Recommendation:           - Patient has a contact number available for                            emergencies. The signs and symptoms of potential                            delayed complications were discussed with the                            patient. Return to normal activities tomorrow.                            Written discharge instructions were provided to the                            patient.                            - Resume previous diet.                           - Continue present medications.                           - Repeat colonoscopy at the next available                            appointment because the bowel preparation was                            suboptimal.                           - For future colonoscopy the patient will require                            an extended preparation. If there are any                            questions, please contact the gastroenterologist. Procedure Code(s):        --- Professional ---                           [id number], 51, Colorectal cancer screening; colonoscopy                            on individual not meeting criteria for high risk  Diagnosis Code(s):        --- Professional ---                           Z12.11, Encounter for screening for malignant                            neoplasm of colon CPT copyright 2016 American Medical Association. All rights reserved. The codes documented in this report are preliminary and upon coder review may  be revised to meet current compliance requirements. Mauri Pole, MD 11/22/2016 9:36:00 AM This report has been signed electronically. Number of Addenda: 0

## 2016-12-07 ENCOUNTER — Other Ambulatory Visit: Payer: Self-pay

## 2016-12-07 ENCOUNTER — Encounter: Payer: Self-pay | Admitting: Gastroenterology

## 2016-12-07 DIAGNOSIS — Z1211 Encounter for screening for malignant neoplasm of colon: Secondary | ICD-10-CM

## 2016-12-10 ENCOUNTER — Encounter: Payer: Self-pay | Admitting: Gastroenterology

## 2016-12-20 ENCOUNTER — Encounter: Payer: Self-pay | Admitting: Family

## 2016-12-20 ENCOUNTER — Ambulatory Visit (INDEPENDENT_AMBULATORY_CARE_PROVIDER_SITE_OTHER): Payer: Self-pay | Admitting: Family

## 2016-12-20 ENCOUNTER — Ambulatory Visit: Payer: Self-pay | Admitting: Family

## 2016-12-20 VITALS — BP 133/79 | HR 69 | Temp 98.6°F | Resp 16 | Ht 64.0 in | Wt 302.0 lb

## 2016-12-20 DIAGNOSIS — I10 Essential (primary) hypertension: Secondary | ICD-10-CM

## 2016-12-20 DIAGNOSIS — D509 Iron deficiency anemia, unspecified: Secondary | ICD-10-CM

## 2016-12-20 LAB — CBC WITH DIFFERENTIAL/PLATELET
BASOS ABS: 0 10*3/uL (ref 0.0–0.1)
Basophils Relative: 0.4 % (ref 0.0–3.0)
EOS PCT: 3.3 % (ref 0.0–5.0)
Eosinophils Absolute: 0.2 10*3/uL (ref 0.0–0.7)
HEMATOCRIT: 36.8 % (ref 36.0–46.0)
Hemoglobin: 12.1 g/dL (ref 12.0–15.0)
LYMPHS PCT: 23.5 % (ref 12.0–46.0)
Lymphs Abs: 1.5 10*3/uL (ref 0.7–4.0)
MCHC: 32.8 g/dL (ref 30.0–36.0)
MCV: 81.2 fl (ref 78.0–100.0)
MONOS PCT: 5.1 % (ref 3.0–12.0)
Monocytes Absolute: 0.3 10*3/uL (ref 0.1–1.0)
Neutro Abs: 4.4 10*3/uL (ref 1.4–7.7)
Neutrophils Relative %: 67.7 % (ref 43.0–77.0)
Platelets: 328 10*3/uL (ref 150.0–400.0)
RBC: 4.53 Mil/uL (ref 3.87–5.11)
RDW: 14.1 % (ref 11.5–15.5)
WBC: 6.5 10*3/uL (ref 4.0–10.5)

## 2016-12-20 LAB — BASIC METABOLIC PANEL
BUN: 12 mg/dL (ref 6–23)
CHLORIDE: 104 meq/L (ref 96–112)
CO2: 31 meq/L (ref 19–32)
Calcium: 9.2 mg/dL (ref 8.4–10.5)
Creatinine, Ser: 0.68 mg/dL (ref 0.40–1.20)
GFR: 116.38 mL/min (ref >60.00)
GLUCOSE: 80 mg/dL (ref 70–99)
POTASSIUM: 3.5 meq/L (ref 3.5–5.1)
Sodium: 142 mEq/L (ref 135–145)

## 2016-12-20 LAB — IRON: Iron: 70 ug/dL (ref 42–145)

## 2016-12-20 MED ORDER — LISINOPRIL 20 MG PO TABS: 20.0000 mg | ORAL_TABLET | Freq: Every day | ORAL | 5 refills | Status: DC

## 2016-12-20 MED ORDER — AMLODIPINE BESYLATE 10 MG PO TABS: 10.0000 mg | ORAL_TABLET | Freq: Every day | ORAL | 5 refills | Status: DC

## 2016-12-20 NOTE — Progress Notes (Signed)
Pre visit review using our clinic review tool, if applicable. No additional management support is needed unless otherwise documented below in the visit note. 

## 2016-12-20 NOTE — Patient Instructions (Signed)
Please complete lab work prior to leaving.   

## 2016-12-20 NOTE — Progress Notes (Signed)
Subjective:    Patient ID: JULIE-ANN VANMAANEN, female    DOB: 1966-02-17, 51 y.o.   MRN: [id number]  HPI  Ms. Cumpton is a 51 yr old female who presents today for follow up.  1) HTN-  Maintained on amlodipine, lisinopril.  Denies CP/SOB or swelling BP Readings from Last 3 Encounters:  12/20/16 133/79  11/22/16 (!) 155/77  09/21/16 122/63   2) Anemia- she continues iron supplement.  Has not had a period in about 11 month.  Lab Results  Component Value Date   WBC 6.6 07/11/2016   HGB 12.3 07/11/2016   HCT 36.6 07/11/2016   MCV 79.1 07/11/2016   PLT 323.0 07/11/2016    Review of Systems    see HPI  Past Medical History:  Diagnosis Date  . Anemia    on iron  . Asthma    childhood  . Elevated blood pressure reading without diagnosis of hypertension      Social History   Social History  . Marital status: Single    Spouse name: N/A  . Number of children: N/A  . Years of education: N/A   Occupational History  . Not on file.   Social History Main Topics  . Smoking status: Never Smoker  . Smokeless tobacco: Never Used  . Alcohol use No  . Drug use: No  . Sexual activity: Not on file   Other Topics Concern  . Not on file   Social History Narrative   1 son- age 31   Unemployed, plans to sub with the [employer]   Associated degree   Lives with mother father/father and son   Has 1 dog lab/spaniel mix   Enjoys reading    Past Surgical History:  Procedure Laterality Date  . NO PAST SURGERIES      Family History  Problem Relation Age of Onset  . Hypertension Mother   . Hyperlipidemia Mother   . Diabetes Mother   . Lung cancer Father     Allergies  Allergen Reactions  . Latex     rash    Current Outpatient Prescriptions on File Prior to Visit  Medication Sig Dispense Refill  . amLODipine (NORVASC) 10 MG tablet TAKE ONE TABLET BY MOUTH ONE TIME DAILY 30 tablet 2  . ferrous sulfate 325 (65 FE) MG tablet Take 325 mg by mouth 2 (two) times daily with a  meal.     . lisinopril (PRINIVIL,ZESTRIL) 20 MG tablet TAKE ONE TABLET BY MOUTH ONE TIME DAILY 30 tablet 3  . Na Sulfate-K Sulfate-Mg Sulf (SUPREP BOWEL PREP KIT) 17.5-3.13-1.6 GM/180ML SOLN Suprep as directed / no substitutions 354 mL 0   No current facility-administered medications on file prior to visit.     BP 133/79 (BP Location: Right Arm, Patient Position: Sitting, Cuff Size: Large)   Pulse 69   Temp 98.6 F (37 C) (Oral)   Resp 16   Ht 5' 4"  (1.626 m)   Wt (!) 302 lb (137 kg)   SpO2 100%   BMI 51.84 kg/m    Objective:   Physical Exam  Constitutional: She is oriented to person, place, and time. She appears well-developed and well-nourished.  HENT:  Head: Normocephalic and atraumatic.  Cardiovascular: Normal rate, regular rhythm and normal heart sounds.   No murmur heard. Pulmonary/Chest: Effort normal and breath sounds normal. No respiratory distress. She has no wheezes.  Lymphadenopathy:    She has no cervical adenopathy.  Neurological: She is alert and oriented to  person, place, and time.  Psychiatric: She has a normal mood and affect. Her behavior is normal. Judgment and thought content normal.          Assessment & Plan:

## 2016-12-20 NOTE — Assessment & Plan Note (Signed)
Stable on iron, will check follow up iron level and cbc

## 2016-12-20 NOTE — Assessment & Plan Note (Signed)
bp is stable on current medications. Continue same, obtain follow up bmet.

## 2017-01-08 ENCOUNTER — Other Ambulatory Visit: Payer: Self-pay | Admitting: Family

## 2017-01-20 ENCOUNTER — Ambulatory Visit (AMBULATORY_SURGERY_CENTER): Payer: Self-pay

## 2017-01-20 VITALS — Ht 64.0 in | Wt 314.4 lb

## 2017-01-20 DIAGNOSIS — Z1211 Encounter for screening for malignant neoplasm of colon: Secondary | ICD-10-CM

## 2017-01-20 MED ORDER — SUPREP BOWEL PREP KIT 17.5-3.13-1.6 GM/177ML PO SOLN: 1.0000 | Freq: Once | ORAL | 0 refills | Status: AC

## 2017-01-20 NOTE — Progress Notes (Signed)
No allergies to eggs or soy No diet meds No home oxygen No past problems with anesthesia  Registered emmi 

## 2017-02-02 ENCOUNTER — Encounter (HOSPITAL_COMMUNITY): Payer: Self-pay | Admitting: *Deleted

## 2017-02-03 ENCOUNTER — Encounter (HOSPITAL_COMMUNITY)
Admission: RE | Disposition: A | Discharge status: Home / Self Care | Payer: Self-pay | Source: Ambulatory Visit | Attending: Gastroenterology

## 2017-02-03 ENCOUNTER — Ambulatory Visit (HOSPITAL_COMMUNITY): Payer: Self-pay | Admitting: Certified Registered"

## 2017-02-03 ENCOUNTER — Encounter (HOSPITAL_COMMUNITY): Payer: Self-pay | Admitting: *Deleted

## 2017-02-03 ENCOUNTER — Ambulatory Visit (HOSPITAL_COMMUNITY)
Admission: RE | Admit: 2017-02-03 | Discharge: 2017-02-03 | Disposition: A | Discharge status: Home / Self Care | Payer: Self-pay | Source: Ambulatory Visit | Attending: Gastroenterology | Admitting: Gastroenterology

## 2017-02-03 DIAGNOSIS — D125 Benign neoplasm of sigmoid colon: Secondary | ICD-10-CM

## 2017-02-03 DIAGNOSIS — Z1212 Encounter for screening for malignant neoplasm of rectum: Secondary | ICD-10-CM

## 2017-02-03 DIAGNOSIS — I10 Essential (primary) hypertension: Secondary | ICD-10-CM | POA: Insufficient documentation

## 2017-02-03 DIAGNOSIS — K635 Polyp of colon: Secondary | ICD-10-CM

## 2017-02-03 DIAGNOSIS — Z6841 Body Mass Index (BMI) 40.0 and over, adult: Secondary | ICD-10-CM | POA: Insufficient documentation

## 2017-02-03 DIAGNOSIS — D123 Benign neoplasm of transverse colon: Secondary | ICD-10-CM

## 2017-02-03 DIAGNOSIS — Z1211 Encounter for screening for malignant neoplasm of colon: Secondary | ICD-10-CM

## 2017-02-03 DIAGNOSIS — D649 Anemia, unspecified: Secondary | ICD-10-CM | POA: Insufficient documentation

## 2017-02-03 DIAGNOSIS — K648 Other hemorrhoids: Secondary | ICD-10-CM | POA: Insufficient documentation

## 2017-02-03 HISTORY — PX: COLONOSCOPY WITH PROPOFOL: SHX5780

## 2017-02-03 HISTORY — DX: Essential (primary) hypertension: I10

## 2017-02-03 SURGERY — COLONOSCOPY WITH PROPOFOL
Anesthesia: Monitor Anesthesia Care

## 2017-02-03 MED ORDER — LIDOCAINE 2% (20 MG/ML) 5 ML SYRINGE
INTRAMUSCULAR | Status: AC
  Filled 2017-02-03: qty 5

## 2017-02-03 MED ORDER — LACTATED RINGERS IV SOLN
INTRAVENOUS | Status: DC
  Administered 2017-02-03: 1000 mL via INTRAVENOUS

## 2017-02-03 MED ORDER — PROPOFOL 10 MG/ML IV BOLUS
INTRAVENOUS | Status: AC
  Filled 2017-02-03: qty 20

## 2017-02-03 MED ORDER — PROPOFOL 10 MG/ML IV BOLUS
INTRAVENOUS | Status: AC
  Filled 2017-02-03: qty 40

## 2017-02-03 MED ORDER — PROPOFOL 500 MG/50ML IV EMUL
INTRAVENOUS | Status: DC | PRN
  Administered 2017-02-03: 100 ug/kg/min via INTRAVENOUS

## 2017-02-03 MED ORDER — SODIUM CHLORIDE 0.9 % IV SOLN: INTRAVENOUS | Status: DC

## 2017-02-03 MED ORDER — PROPOFOL 10 MG/ML IV BOLUS
INTRAVENOUS | Status: DC | PRN
  Administered 2017-02-03: 30 mg via INTRAVENOUS
  Administered 2017-02-03: 20 mg via INTRAVENOUS

## 2017-02-03 MED ORDER — LIDOCAINE 2% (20 MG/ML) 5 ML SYRINGE
INTRAMUSCULAR | Status: DC | PRN
  Administered 2017-02-03: 80 mg via INTRAVENOUS

## 2017-02-03 SURGICAL SUPPLY — 22 items

## 2017-02-03 NOTE — Discharge Instructions (Signed)
Colonoscopy, Adult, Care After  This sheet gives you information about how to care for yourself after your procedure. Your health care provider may also give you more specific instructions. If you have problems or questions, contact your health care provider.  What can I expect after the procedure?  After the procedure, it is common to have:  · A small amount of blood in your stool for 24 hours after the procedure.  · Some gas.  · Mild abdominal cramping or bloating.    Follow these instructions at home:  General instructions     · For the first 24 hours after the procedure:  ? Do not drive or use machinery.  ? Do not sign important documents.  ? Do not drink alcohol.  ? Do your regular daily activities at a slower pace than normal.  ? Eat soft, easy-to-digest foods.  ? Rest often.  · Take over-the-counter or prescription medicines only as told by your health care provider.  · It is up to you to get the results of your procedure. Ask your health care provider, or the department performing the procedure, when your results will be ready.  Relieving cramping and bloating   · Try walking around when you have cramps or feel bloated.  · Apply heat to your abdomen as told by your health care provider. Use a heat source that your health care provider recommends, such as a moist heat pack or a heating pad.  ? Place a towel between your skin and the heat source.  ? Leave the heat on for 20-30 minutes.  ? Remove the heat if your skin turns bright red. This is especially important if you are unable to feel pain, heat, or cold. You may have a greater risk of getting burned.  Eating and drinking   · Drink enough fluid to keep your urine clear or pale yellow.  · Resume your normal diet as instructed by your health care provider. Avoid heavy or fried foods that are hard to digest.  · Avoid drinking alcohol for as long as instructed by your health care provider.  Contact a health care provider if:  · You have blood in your stool 2-3  days after the procedure.  Get help right away if:  · You have more than a small spotting of blood in your stool.  · You pass large blood clots in your stool.  · Your abdomen is swollen.  · You have nausea or vomiting.  · You have a fever.  · You have increasing abdominal pain that is not relieved with medicine.  This information is not intended to replace advice given to you by your health care provider. Make sure you discuss any questions you have with your health care provider.  Document Released: 05/17/2004 Document Revised: 06/27/2016 Document Reviewed: 12/15/2015  Elsevier Interactive Patient Education © 2017 Elsevier Inc[employer]

## 2017-02-03 NOTE — H&P (Signed)
[facility] History and Physical   Primary Care Physician:  [name]., NP   Reason for Procedure:   Colorectal cancer screening Plan:    Colonoscopy with possible intervention     HPI: Marissa Bowman is a 51 y.o. female with morbid obesity BMI>50 here for screening colonoscopy. Denies any nausea, vomiting, abdominal pain, melena or bright red blood per rectum    Past Medical History:  Diagnosis Date  . Anemia    on iron  . Asthma    childhood  . Hypertension     Past Surgical History:  Procedure Laterality Date  . COLONOSCOPY    . NO PAST SURGERIES      Prior to Admission medications   Medication Sig Start Date End Date Taking? Authorizing Provider  amLODipine (NORVASC) 10 MG tablet Take 1 tablet (10 mg total) by mouth daily. 12/20/16  Yes [name], NP  ferrous sulfate 325 (65 FE) MG tablet Take 325 mg by mouth 2 (two) times daily with a meal.    Yes Historical Provider, MD  lisinopril (PRINIVIL,ZESTRIL) 20 MG tablet Take 1 tablet (20 mg total) by mouth daily. 12/20/16  Yes [name], NP    Current Facility-Administered Medications  Medication Dose Route Frequency Provider Last Rate Last Dose  . 0.9 %  sodium chloride infusion   Intravenous Continuous Kavitha Nandigam V, MD      . lactated ringers infusion   Intravenous Continuous Harl Bowie V, MD 10 mL/hr at 02/03/17 0712 1,000 mL at 02/03/17 2500    Allergies as of 12/07/2016 - Review Complete 11/22/2016  Allergen Reaction Noted  . Latex  11/08/2016    Family History  Problem Relation Age of Onset  . Hypertension Mother   . Hyperlipidemia Mother   . Diabetes Mother   . Lung cancer Father   . Colon cancer Neg Hx     Social History   Social History  . Marital status: Single    Spouse name: N/A  . Number of children: N/A  . Years of education: N/A   Occupational History  . Not on file.   Social History Main Topics  . Smoking status: Never Smoker  .  Smokeless tobacco: Never Used  . Alcohol use No  . Drug use: No  . Sexual activity: Not on file   Other Topics Concern  . Not on file   Social History Narrative   1 son- age 108   Unemployed, plans to sub with the [employer]   Associated degree   Lives with mother father/father and son   Has 1 dog lab/spaniel mix   Enjoys reading    Review of Systems:  All other review of systems negative except as mentioned in the HPI.  Physical Exam: Vital signs in last 24 hours: Temp:  [97.7 F (36.5 C)] 97.7 F (36.5 C) (04/20 0658) Pulse Rate:  [71] 71 (04/20 0658) Resp:  [17] 17 (04/20 0658) BP: (174)/(73) 174/73 (04/20 0658) SpO2:  [99 %] 99 % (04/20 0658) Weight:  [314 lb (142.4 kg)] 314 lb (142.4 kg) (04/20 0658)  Body mass index is 53.9 kg/m.    General:   Alert,  Well-developed, well-nourished, pleasant and cooperative in NAD Lungs:  Clear throughout to auscultation.   Heart:  Regular rate and rhythm; no murmurs, clicks, rubs,  or gallops. Abdomen:  Soft, nontender and nondistended. Normal bowel sounds.   Neuro/Psych:  Alert and cooperative. Normal mood and affect. A and O x 3   @  Damaris Hippo, MD [phone] Mon-Fri 8a-5p [phone] after 5p, weekends, holidays 02/03/2017 8:06 AM@

## 2017-02-03 NOTE — Transfer of Care (Signed)
Immediate Anesthesia Transfer of Care Note  Patient: Marissa Bowman  Procedure(s) Performed: Procedure(s): COLONOSCOPY WITH PROPOFOL (N/A)  Patient Location: PACU  Anesthesia Type:MAC  Level of Consciousness: awake, alert  and oriented  Airway & Oxygen Therapy: Patient Spontanous Breathing and Patient connected to face mask oxygen  Post-op Assessment: Report given to RN and Post -op Vital signs reviewed and stable  Post vital signs: Reviewed and stable  Last Vitals:  Vitals:   02/03/17 0658  BP: (!) 174/73  Pulse: 71  Resp: 17  Temp: 36.5 C    Last Pain:  Vitals:   02/03/17 0658  TempSrc: Oral         Complications: No apparent anesthesia complications

## 2017-02-03 NOTE — Op Note (Signed)
[facility] Patient Name: Marissa Bowman Procedure Date: 02/03/2017 MRN: [id number] Attending MD: Mauri Pole , MD Date of Birth: 01-17-1966 CSN: [id number] Age: 51 Admit Type: Outpatient Procedure:                Colonoscopy Indications:              Screening for colorectal malignant neoplasm, This                            is the patient's first colonoscopy Providers:                Mauri Pole, MD, Hilma Favors, RN, Cherylynn Ridges, Technician, [facility], CRNA Referring MD:              Medicines:                Monitored Anesthesia Care Complications:            No immediate complications. Estimated Blood Loss:     Estimated blood loss was minimal. Procedure:                Pre-Anesthesia Assessment:                           - Prior to the procedure, a History and Physical                            was performed, and patient medications and                            allergies were reviewed. The patient's tolerance of                            previous anesthesia was also reviewed. The risks                            and benefits of the procedure and the sedation                            options and risks were discussed with the patient.                            All questions were answered, and informed consent                            was obtained. Prior Anticoagulants: The patient has                            taken no previous anticoagulant or antiplatelet                            agents. ASA Grade Assessment: III - A patient with  severe systemic disease. After reviewing the risks                            and benefits, the patient was deemed in                            satisfactory condition to undergo the procedure.                           After obtaining informed consent, the colonoscope                            was passed under direct vision. Throughout the                           procedure, the patient's blood pressure, pulse, and                            oxygen saturations were monitored continuously. The                            EC-[id number] ([id number]) scope was introduced through                            the anus and advanced to the the cecum, identified                            by appendiceal orifice and ileocecal valve. The                            colonoscopy was performed without difficulty. The                            patient tolerated the procedure well. The quality                            of the bowel preparation was excellent. The                            ileocecal valve, appendiceal orifice, and rectum                            were photographed. Scope In: 8:17:39 AM Scope Out: 8:42:27 AM Scope Withdrawal Time: 0 hours 15 minutes 29 seconds  Total Procedure Duration: 0 hours 24 minutes 48 seconds  Findings:      The perianal and digital rectal examinations were normal.      A 2 mm polyp was found in the transverse colon. The polyp was sessile.       The polyp was removed with a cold biopsy forceps. Resection and       retrieval were complete.      A 5 mm polyp was found in the sigmoid colon. The polyp was sessile. The       polyp was removed with a cold snare. Resection and retrieval were  complete.      Non-bleeding internal hemorrhoids were found during retroflexion. The       hemorrhoids were small. Impression:               - One 2 mm polyp in the transverse colon, removed                            with a cold biopsy forceps. Resected and retrieved.                           - One 5 mm polyp in the sigmoid colon, removed with                            a cold snare. Resected and retrieved.                           - Non-bleeding internal hemorrhoids. Moderate Sedation:      N/A- Per Anesthesia Care Recommendation:           - Patient has a contact number available for                             emergencies. The signs and symptoms of potential                            delayed complications were discussed with the                            patient. Return to normal activities tomorrow.                            Written discharge instructions were provided to the                            patient.                           - Resume previous diet.                           - Continue present medications.                           - Await pathology results.                           - Repeat colonoscopy in 5-10 years date to be                            determined after pending pathology results are                            reviewed for surveillance based on pathology                            results. Procedure Code(s):        ---  Professional ---                           [phone], Colonoscopy, flexible; with removal of                            tumor(s), polyp(s), or other lesion(s) by snare                            technique                           45380, 59, Colonoscopy, flexible; with biopsy,                            single or multiple Diagnosis Code(s):        --- Professional ---                           Z12.11, Encounter for screening for malignant                            neoplasm of colon                           D12.3, Benign neoplasm of transverse colon (hepatic                            flexure or splenic flexure)                           D12.5, Benign neoplasm of sigmoid colon                           K64.8, Other hemorrhoids CPT copyright 2016 American Medical Association. All rights reserved. The codes documented in this report are preliminary and upon coder review may  be revised to meet current compliance requirements. Mauri Pole, MD 02/03/2017 9:01:13 AM This report has been signed electronically. Number of Addenda: 0

## 2017-02-03 NOTE — Anesthesia Preprocedure Evaluation (Signed)
Anesthesia Evaluation  Patient identified by MRN, date of birth, ID band Patient awake    Reviewed: Allergy & Precautions, NPO status , Patient's Chart, lab work & pertinent test results  Airway Mallampati: II  TM Distance: <3 FB Neck ROM: Full    Dental no notable dental hx.    Pulmonary neg pulmonary ROS,    Pulmonary exam normal breath sounds clear to auscultation       Cardiovascular hypertension, Normal cardiovascular exam Rhythm:Regular Rate:Normal     Neuro/Psych negative neurological ROS  negative psych ROS   GI/Hepatic negative GI ROS, Neg liver ROS,   Endo/Other  Morbid obesity  Renal/GU negative Renal ROS  negative genitourinary   Musculoskeletal negative musculoskeletal ROS (+)   Abdominal (+) + obese,   Peds negative pediatric ROS (+)  Hematology negative hematology ROS (+)   Anesthesia Other Findings   Reproductive/Obstetrics negative OB ROS                             Anesthesia Physical Anesthesia Plan  ASA: III  Anesthesia Plan: MAC   Post-op Pain Management:    Induction: Intravenous  Airway Management Planned: Simple Face Mask  Additional Equipment:   Intra-op Plan:   Post-operative Plan:   Informed Consent: I have reviewed the patients History and Physical, chart, labs and discussed the procedure including the risks, benefits and alternatives for the proposed anesthesia with the patient or authorized representative who has indicated his/her understanding and acceptance.   Dental advisory given  Plan Discussed with: CRNA and Surgeon  Anesthesia Plan Comments:         Anesthesia Quick Evaluation

## 2017-02-03 NOTE — Anesthesia Postprocedure Evaluation (Addendum)
Anesthesia Post Note  Patient: Marissa Bowman  Procedure(s) Performed: Procedure(s) (LRB): COLONOSCOPY WITH PROPOFOL (N/A)  Patient location during evaluation: PACU Anesthesia Type: MAC Level of consciousness: awake and alert Pain management: pain level controlled Vital Signs Assessment: post-procedure vital signs reviewed and stable Respiratory status: spontaneous breathing, nonlabored ventilation, respiratory function stable and patient connected to nasal cannula oxygen Cardiovascular status: stable and blood pressure returned to baseline Anesthetic complications: no       Last Vitals:  Vitals:   02/03/17 0658 02/03/17 0850  BP: (!) 174/73 124/71  Pulse: 71 (!) 56  Resp: 17 18  Temp: 36.5 C 36.5 C    Last Pain:  Vitals:   02/03/17 0850  TempSrc: Oral                 [name] S

## 2017-02-06 ENCOUNTER — Encounter: Payer: Self-pay | Admitting: Gastroenterology

## 2017-02-06 ENCOUNTER — Encounter (HOSPITAL_COMMUNITY): Payer: Self-pay | Admitting: Gastroenterology

## 2017-02-14 ENCOUNTER — Other Ambulatory Visit: Payer: Self-pay | Admitting: Family

## 2017-03-20 NOTE — Addendum Note (Signed)
Addendum  created 03/20/17 1329 by Myrtie Soman, MD   Sign clinical note

## 2017-04-12 ENCOUNTER — Other Ambulatory Visit: Payer: Self-pay | Admitting: Family

## 2017-06-21 ENCOUNTER — Other Ambulatory Visit: Payer: Self-pay | Admitting: Family

## 2017-06-21 ENCOUNTER — Ambulatory Visit (INDEPENDENT_AMBULATORY_CARE_PROVIDER_SITE_OTHER): Payer: Self-pay | Admitting: Family

## 2017-06-21 ENCOUNTER — Encounter: Payer: Self-pay | Admitting: Family

## 2017-06-21 VITALS — BP 124/78 | HR 52 | Temp 98.6°F | Resp 16 | Ht 64.0 in | Wt 306.8 lb

## 2017-06-21 DIAGNOSIS — I10 Essential (primary) hypertension: Secondary | ICD-10-CM

## 2017-06-21 DIAGNOSIS — D509 Iron deficiency anemia, unspecified: Secondary | ICD-10-CM

## 2017-06-21 LAB — CBC WITH DIFFERENTIAL/PLATELET
BASOS ABS: 0 10*3/uL (ref 0.0–0.1)
BASOS PCT: 0.6 % (ref 0.0–3.0)
Eosinophils Absolute: 0.2 10*3/uL (ref 0.0–0.7)
Eosinophils Relative: 3.3 % (ref 0.0–5.0)
HEMATOCRIT: 35.7 % — AB (ref 36.0–46.0)
HEMOGLOBIN: 11.6 g/dL — AB (ref 12.0–15.0)
LYMPHS PCT: 22.8 % (ref 12.0–46.0)
Lymphs Abs: 1.5 10*3/uL (ref 0.7–4.0)
MCHC: 32.4 g/dL (ref 30.0–36.0)
MCV: 80.5 fl (ref 78.0–100.0)
MONOS PCT: 5.8 % (ref 3.0–12.0)
Monocytes Absolute: 0.4 10*3/uL (ref 0.1–1.0)
NEUTROS ABS: 4.4 10*3/uL (ref 1.4–7.7)
Neutrophils Relative %: 67.5 % (ref 43.0–77.0)
PLATELETS: 321 10*3/uL (ref 150.0–400.0)
RBC: 4.43 Mil/uL (ref 3.87–5.11)
RDW: 15 % (ref 11.5–15.5)
WBC: 6.5 10*3/uL (ref 4.0–10.5)

## 2017-06-21 LAB — BASIC METABOLIC PANEL
BUN: 9 mg/dL (ref 6–23)
CALCIUM: 9.2 mg/dL (ref 8.4–10.5)
CHLORIDE: 102 meq/L (ref 96–112)
CO2: 30 mEq/L (ref 19–32)
CREATININE: 0.68 mg/dL (ref 0.40–1.20)
GFR: 116.16 mL/min (ref >60.00)
GLUCOSE: 98 mg/dL (ref 70–99)
Potassium: 3.6 mEq/L (ref 3.5–5.1)
Sodium: 139 mEq/L (ref 135–145)

## 2017-06-21 MED ORDER — AMLODIPINE BESYLATE 10 MG PO TABS: 10.0000 mg | ORAL_TABLET | Freq: Every day | ORAL | 5 refills | Status: DC

## 2017-06-21 MED ORDER — LISINOPRIL 20 MG PO TABS: 20.0000 mg | ORAL_TABLET | Freq: Every day | ORAL | 5 refills | Status: DC

## 2017-06-21 NOTE — Patient Instructions (Signed)
Please complete lab work prior to leaving.   

## 2017-06-21 NOTE — Progress Notes (Signed)
Subjective:    Patient ID: Marissa Bowman, female    DOB: [birth year], 51 y.o.   MRN: [id number]  HPI   Marissa Bowman is a 51 yr old female who presents today for follow up.  1) HTN- current medications include lisinopril 20mg  daily and amlodipine 10mg  once daily. Denies CP/SOB or swelling.  BP Readings from Last 3 Encounters:  06/21/17 124/78  02/03/17 (!) 175/61  12/20/16 133/79   2) anemia- she is maintained on iron 325mg  she is taking 2 tabs once daily in the AM.   Lab Results  Component Value Date   WBC 6.5 12/20/2016   HGB 12.1 12/20/2016   HCT 36.8 12/20/2016   MCV 81.2 12/20/2016   PLT 328.0 12/20/2016   3) Morbid obesity- has started walking.  She is also watching her diet.   Wt Readings from Last 3 Encounters:  06/21/17 (!) 306 lb 12.8 oz (139.2 kg)  02/03/17 (!) 314 lb (142.4 kg)  01/20/17 (!) 314 lb 6.4 oz (142.6 kg)      Review of Systems See HPI  Past Medical History:  Diagnosis Date  . Anemia    on iron  . Asthma    childhood  . Hypertension      Social History   Social History  . Marital status: Single    Spouse name: N/A  . Number of children: N/A  . Years of education: N/A   Occupational History  . Not on file.   Social History Main Topics  . Smoking status: Never Smoker  . Smokeless tobacco: Never Used  . Alcohol use No  . Drug use: No  . Sexual activity: Not on file   Other Topics Concern  . Not on file   Social History Narrative   1 son- age 31   Unemployed, plans to sub with the [employer]   Associated degree   Lives with mother father/father and son   Has 1 dog lab/spaniel mix   Enjoys reading    Past Surgical History:  Procedure Laterality Date  . COLONOSCOPY    . COLONOSCOPY WITH PROPOFOL N/A 02/03/2017   Procedure: COLONOSCOPY WITH PROPOFOL;  Surgeon: Mauri Pole, MD;  Location: [facility];  Service: Endoscopy;  Laterality: N/A;  . NO PAST SURGERIES      Family History  Problem Relation Age of Onset  .  Hypertension Mother   . Hyperlipidemia Mother   . Diabetes Mother   . Lung cancer Father   . Colon cancer Neg Hx     Allergies  Allergen Reactions  . Latex     rash    Current Outpatient Prescriptions on File Prior to Visit  Medication Sig Dispense Refill  . amLODipine (NORVASC) 10 MG tablet Take 1 tablet (10 mg total) by mouth daily. 30 tablet 5  . ferrous sulfate 325 (65 FE) MG tablet Take 325 mg by mouth 2 (two) times daily with a meal.     . lisinopril (PRINIVIL,ZESTRIL) 20 MG tablet Take 1 tablet (20 mg total) by mouth daily. 30 tablet 5   No current facility-administered medications on file prior to visit.     BP 124/78 (BP Location: Right Arm) Comment (Cuff Size): thigh  Pulse (!) 52   Temp 98.6 F (37 C) (Oral)   Resp 16   Ht 5\' 4"  (1.626 m)   Wt (!) 306 lb 12.8 oz (139.2 kg)   SpO2 100%   BMI 52.66 kg/m  Objective:   Physical Exam  Constitutional: She is oriented to person, place, and time. She appears well-developed and well-nourished.  HENT:  Head: Normocephalic.  Cardiovascular: Normal rate, regular rhythm and normal heart sounds.   No murmur heard. Pulmonary/Chest: Effort normal and breath sounds normal. No respiratory distress. She has no wheezes.  Musculoskeletal: She exhibits no edema.  Neurological: She is alert and oriented to person, place, and time.  Skin: Skin is warm and dry.  Psychiatric: She has a normal mood and affect. Her behavior is normal. Judgment and thought content normal.          Assessment & Plan:  HTN- BP looks great. Continue current meds. Obtain follow up bmet.   Iron def anemia- obtain follow up iron studies, CBC.  Morbid obesity- I commended patient for her weight loss, improved diet and exercise efforts.

## 2017-06-22 LAB — IRON,TIBC AND FERRITIN PANEL
%SAT: 31 % (ref 11–50)
FERRITIN: 292 ng/mL — AB (ref 10–232)
Iron: 70 ug/dL (ref 45–160)
TIBC: 226 mcg/dL (calc) — ABNORMAL LOW (ref 250–450)

## 2017-09-20 ENCOUNTER — Encounter: Payer: Self-pay | Admitting: Family

## 2017-09-20 ENCOUNTER — Other Ambulatory Visit (HOSPITAL_COMMUNITY)
Admission: RE | Admit: 2017-09-20 | Discharge: 2017-09-20 | Disposition: A | Discharge status: Home / Self Care | Payer: Self-pay | Source: Ambulatory Visit | Attending: Family | Admitting: Family

## 2017-09-20 ENCOUNTER — Ambulatory Visit (INDEPENDENT_AMBULATORY_CARE_PROVIDER_SITE_OTHER): Payer: Self-pay | Admitting: Family

## 2017-09-20 VITALS — BP 130/82 | HR 64 | Resp 16 | Ht 64.0 in | Wt 316.2 lb

## 2017-09-20 DIAGNOSIS — Z1239 Encounter for other screening for malignant neoplasm of breast: Secondary | ICD-10-CM

## 2017-09-20 DIAGNOSIS — Z01419 Encounter for gynecological examination (general) (routine) without abnormal findings: Secondary | ICD-10-CM

## 2017-09-20 DIAGNOSIS — Z0000 Encounter for general adult medical examination without abnormal findings: Secondary | ICD-10-CM

## 2017-09-20 DIAGNOSIS — Z1231 Encounter for screening mammogram for malignant neoplasm of breast: Secondary | ICD-10-CM

## 2017-09-20 LAB — URINALYSIS, ROUTINE W REFLEX MICROSCOPIC
BILIRUBIN URINE: NEGATIVE
Ketones, ur: NEGATIVE
LEUKOCYTES UA: NEGATIVE
NITRITE: NEGATIVE
Specific Gravity, Urine: 1.015 (ref 1.000–1.030)
Total Protein, Urine: NEGATIVE
Urine Glucose: NEGATIVE
Urobilinogen, UA: 0.2 (ref 0.0–1.0)
pH: 7 (ref 5.0–8.0)

## 2017-09-20 LAB — CBC WITH DIFFERENTIAL/PLATELET
BASOS PCT: 0.6 % (ref 0.0–3.0)
Basophils Absolute: 0 10*3/uL (ref 0.0–0.1)
EOS PCT: 3 % (ref 0.0–5.0)
Eosinophils Absolute: 0.2 10*3/uL (ref 0.0–0.7)
HCT: 35.1 % — ABNORMAL LOW (ref 36.0–46.0)
Hemoglobin: 11.5 g/dL — ABNORMAL LOW (ref 12.0–15.0)
LYMPHS ABS: 1.4 10*3/uL (ref 0.7–4.0)
Lymphocytes Relative: 18.5 % (ref 12.0–46.0)
MCHC: 32.8 g/dL (ref 30.0–36.0)
MCV: 80.7 fl (ref 78.0–100.0)
MONO ABS: 0.5 10*3/uL (ref 0.1–1.0)
MONOS PCT: 6.2 % (ref 3.0–12.0)
NEUTROS ABS: 5.3 10*3/uL (ref 1.4–7.7)
NEUTROS PCT: 71.7 % (ref 43.0–77.0)
PLATELETS: 323 10*3/uL (ref 150.0–400.0)
RBC: 4.35 Mil/uL (ref 3.87–5.11)
RDW: 14.7 % (ref 11.5–15.5)
WBC: 7.4 10*3/uL (ref 4.0–10.5)

## 2017-09-20 LAB — LIPID PANEL
CHOLESTEROL: 201 mg/dL — AB (ref 0–200)
HDL: 63.2 mg/dL (ref >39.00)
LDL Cholesterol: 121 mg/dL — ABNORMAL HIGH (ref 0–99)
NonHDL: 137.71
TRIGLYCERIDES: 84 mg/dL (ref 0.0–149.0)
Total CHOL/HDL Ratio: 3
VLDL: 16.8 mg/dL (ref 0.0–40.0)

## 2017-09-20 LAB — BASIC METABOLIC PANEL
BUN: 9 mg/dL (ref 6–23)
CO2: 31 meq/L (ref 19–32)
Calcium: 8.8 mg/dL (ref 8.4–10.5)
Chloride: 101 mEq/L (ref 96–112)
Creatinine, Ser: 0.58 mg/dL (ref 0.40–1.20)
GFR: 139.43 mL/min (ref >60.00)
GLUCOSE: 87 mg/dL (ref 70–99)
POTASSIUM: 3.8 meq/L (ref 3.5–5.1)
SODIUM: 140 meq/L (ref 135–145)

## 2017-09-20 LAB — HEPATIC FUNCTION PANEL
ALBUMIN: 4 g/dL (ref 3.5–5.2)
ALT: 9 U/L (ref 0–35)
AST: 12 U/L (ref 0–37)
Alkaline Phosphatase: 67 U/L (ref 39–117)
BILIRUBIN TOTAL: 0.4 mg/dL (ref 0.2–1.2)
Bilirubin, Direct: 0.1 mg/dL (ref 0.0–0.3)
Total Protein: 7.5 g/dL (ref 6.0–8.3)

## 2017-09-20 LAB — TSH: TSH: 3.14 u[IU]/mL (ref 0.35–4.50)

## 2017-09-20 NOTE — Progress Notes (Signed)
Subjective:    Patient ID: Marissa Bowman, female    DOB: March 18, 1966, 51 y.o.   MRN: [id number]  HPI   Marissa Bowman is a 51 yr old female who presents today for cpx.   Patient presents today for complete physical.  Immunizations: tdap 2014, flu shot  Up to date Diet: eating too many sweet Exercise: not exercising Colonoscopy:4/18 Pap Smear:  11/15 Mammogram:  11/17 Wt Readings from Last 3 Encounters:  09/20/17 (!) 316 lb 3.2 oz (143.4 kg)  06/21/17 (!) 306 lb 12.8 oz (139.2 kg)  02/03/17 (!) 314 lb (142.4 kg)    Review of Systems  Constitutional: Negative for unexpected weight change.  HENT: Negative for hearing loss.        Has had some chest congestion  Eyes:       Needs reading glasses  Respiratory: Positive for cough.   Cardiovascular: Negative for leg swelling.  Gastrointestinal: Negative for diarrhea.       Some constipation due to iron  Genitourinary: Negative for dysuria and frequency.  Musculoskeletal: Negative for arthralgias and myalgias.  Skin: Negative for rash.  Neurological:       Some numbness right anterior thigh- not new  Hematological: Negative for adenopathy.  Psychiatric/Behavioral:       Denies depression/anxiety   Past Medical History:  Diagnosis Date  . Anemia    on iron  . Asthma    childhood  . Hypertension      Social History   Socioeconomic History  . Marital status: Single    Spouse name: Not on file  . Number of children: Not on file  . Years of education: Not on file  . Highest education level: Not on file  Social Needs  . Financial resource strain: Not on file  . Food insecurity - worry: Not on file  . Food insecurity - inability: Not on file  . Transportation needs - medical: Not on file  . Transportation needs - non-medical: Not on file  Occupational History  . Not on file  Tobacco Use  . Smoking status: Never Smoker  . Smokeless tobacco: Never Used  Substance and Sexual Activity  . Alcohol use: No  . Drug use:  No  . Sexual activity: Not on file  Other Topics Concern  . Not on file  Social History Narrative   1 son- age 53   Unemployed, plans to sub with the [employer]   Associated degree   Lives with mother father/father and son   Has 1 dog lab/spaniel mix   Enjoys reading    Past Surgical History:  Procedure Laterality Date  . COLONOSCOPY    . COLONOSCOPY WITH PROPOFOL N/A 02/03/2017   Procedure: COLONOSCOPY WITH PROPOFOL;  Surgeon: Mauri Pole, MD;  Location: [facility];  Service: Endoscopy;  Laterality: N/A;  . NO PAST SURGERIES      Family History  Problem Relation Age of Onset  . Hypertension Mother   . Hyperlipidemia Mother   . Diabetes Mother   . Lung cancer Father   . Colon cancer Neg Hx     Allergies  Allergen Reactions  . Latex     rash    Current Outpatient Medications on File Prior to Visit  Medication Sig Dispense Refill  . amLODipine (NORVASC) 10 MG tablet Take 1 tablet (10 mg total) by mouth daily. 30 tablet 5  . ferrous sulfate 325 (65 FE) MG tablet Take 325 mg by mouth 2 (two) times daily  with a meal.     . lisinopril (PRINIVIL,ZESTRIL) 20 MG tablet Take 1 tablet (20 mg total) by mouth daily. 30 tablet 5   No current facility-administered medications on file prior to visit.     BP 130/82 (BP Location: Right Arm) Comment (Cuff Size): thigh  Pulse 64   Resp 16   Ht 5\' 4"  (1.626 m)   Wt (!) 316 lb 3.2 oz (143.4 kg)   SpO2 100%   BMI 54.28 kg/m       Objective:   Physical Exam Physical Exam  Constitutional: Morbidly obese. She is oriented to person, place, and time. She appears well-developed and well-nourished. No distress.  HENT:  Head: Normocephalic and atraumatic.  Right Ear: Tympanic membrane and ear canal normal.  Left Ear: Tympanic membrane and ear canal normal.  Mouth/Throat: Oropharynx is clear and moist.  Eyes: Pupils are equal, round, and reactive to light. No scleral icterus.  Neck: Normal range of motion. No thyromegaly  present.  Cardiovascular: Normal rate and regular rhythm.   No murmur heard. Pulmonary/Chest: Effort normal and breath sounds normal. No respiratory distress. He has no wheezes. She has no rales. She exhibits no tenderness.  Abdominal: Soft. Bowel sounds are normal. She exhibits no distension and no mass. There is no tenderness. There is no rebound and no guarding.  Musculoskeletal: She exhibits no edema.  Lymphadenopathy:    She has no cervical adenopathy.  Neurological: She is alert and oriented to person, place, and time. She has normal patellar reflexes. She exhibits normal muscle tone. Coordination normal.  Skin: Skin is warm and dry.  Psychiatric: She has a normal mood and affect. Her behavior is normal. Judgment and thought content normal.  Breasts: Examined lying Right: Without masses, retractions, discharge or axillary adenopathy.  Left: Without masses, retractions, discharge or axillary adenopathy.  Inguinal/mons: Normal without inguinal adenopathy  External genitalia: Normal  BUS/Urethra/Skene's glands: Normal  Bladder: Normal  Vagina: Normal  Cervix: Normal  Uterus: normal in size, shape and contour. Midline and mobile  Adnexa/parametria:  Rt: Without masses or tenderness.  Lt: Without masses or tenderness.  Anus and perineum: Normal            Assessment & Plan:  Preventative care- discussed healthy diet, exercise, weight loss.  Pap performed.  EKG tracing is personally reviewed.  EKG notes NSR.  No acute changes. Refer for mammo. Immunizations reviewed and up to date.       Assessment & Plan:

## 2017-09-20 NOTE — Patient Instructions (Signed)
Please complete lab work prior to leaving. Continue to work on healthy diet, exercise and weight loss.  

## 2017-09-27 ENCOUNTER — Telehealth: Payer: Self-pay | Admitting: Family

## 2017-09-27 LAB — CYTOLOGY - PAP
Diagnosis: NEGATIVE
HPV: NOT DETECTED

## 2017-09-27 NOTE — Telephone Encounter (Signed)
See my chart message

## 2017-10-07 ENCOUNTER — Ambulatory Visit (HOSPITAL_BASED_OUTPATIENT_CLINIC_OR_DEPARTMENT_OTHER)
Admission: RE | Admit: 2017-10-07 | Discharge: 2017-10-07 | Disposition: A | Discharge status: Home / Self Care | Payer: Self-pay | Source: Ambulatory Visit | Attending: Family | Admitting: Family

## 2017-10-07 DIAGNOSIS — Z1231 Encounter for screening mammogram for malignant neoplasm of breast: Secondary | ICD-10-CM | POA: Insufficient documentation

## 2017-10-07 DIAGNOSIS — Z1239 Encounter for other screening for malignant neoplasm of breast: Secondary | ICD-10-CM

## 2017-10-07 DIAGNOSIS — R928 Other abnormal and inconclusive findings on diagnostic imaging of breast: Secondary | ICD-10-CM | POA: Insufficient documentation

## 2017-10-11 ENCOUNTER — Other Ambulatory Visit: Payer: Self-pay | Admitting: Family

## 2017-10-11 DIAGNOSIS — R928 Other abnormal and inconclusive findings on diagnostic imaging of breast: Secondary | ICD-10-CM

## 2017-10-13 ENCOUNTER — Telehealth: Payer: Self-pay | Admitting: *Deleted

## 2017-10-13 NOTE — Telephone Encounter (Signed)
Received Physician Orders from [facility]; will have doc-of-day sign in PCP's absence; forwarded to provider/SLS 12/28

## 2017-12-18 ENCOUNTER — Telehealth: Payer: Self-pay | Admitting: *Deleted

## 2017-12-18 NOTE — Telephone Encounter (Signed)
Received letter from [facility] stating that pt was to have additional diagnostic evaluation based on her recent breast imaging performed on 10/07/17. They have made attempts to reach pt, including a Certified Letter, without response; forwarded to provider/SLS 03/04

## 2017-12-27 ENCOUNTER — Telehealth: Payer: Self-pay | Admitting: Family

## 2017-12-27 NOTE — Telephone Encounter (Signed)
I received a note that patient was contacted by the breast center to set up a diagnostic mammogram but never scheduled.  Please advise her on the importance of following through with this to make sure that she does not have breast cancer and give her the number to the breast center to call and schedule.

## 2017-12-27 NOTE — Telephone Encounter (Signed)
Notified pt and she voices understanding. Phone number given to pt and she states she will call to schedule appt.

## 2018-01-01 ENCOUNTER — Other Ambulatory Visit: Payer: Self-pay | Admitting: Family

## 2018-03-21 ENCOUNTER — Ambulatory Visit (INDEPENDENT_AMBULATORY_CARE_PROVIDER_SITE_OTHER): Payer: Self-pay | Admitting: Family

## 2018-03-21 ENCOUNTER — Encounter: Payer: Self-pay | Admitting: Family

## 2018-03-21 VITALS — BP 150/82 | HR 65 | Temp 97.8°F | Resp 16 | Ht 64.0 in | Wt 320.2 lb

## 2018-03-21 DIAGNOSIS — D509 Iron deficiency anemia, unspecified: Secondary | ICD-10-CM

## 2018-03-21 DIAGNOSIS — I10 Essential (primary) hypertension: Secondary | ICD-10-CM

## 2018-03-21 LAB — CBC WITH DIFFERENTIAL/PLATELET
BASOS ABS: 0 10*3/uL (ref 0.0–0.1)
Basophils Relative: 0.3 % (ref 0.0–3.0)
EOS ABS: 0.2 10*3/uL (ref 0.0–0.7)
Eosinophils Relative: 3.4 % (ref 0.0–5.0)
HCT: 33.5 % — ABNORMAL LOW (ref 36.0–46.0)
Hemoglobin: 11.1 g/dL — ABNORMAL LOW (ref 12.0–15.0)
LYMPHS ABS: 1.1 10*3/uL (ref 0.7–4.0)
Lymphocytes Relative: 19.5 % (ref 12.0–46.0)
MCHC: 33.2 g/dL (ref 30.0–36.0)
MCV: 77.1 fl — ABNORMAL LOW (ref 78.0–100.0)
MONO ABS: 0.3 10*3/uL (ref 0.1–1.0)
Monocytes Relative: 5.6 % (ref 3.0–12.0)
NEUTROS ABS: 4.2 10*3/uL (ref 1.4–7.7)
NEUTROS PCT: 71.2 % (ref 43.0–77.0)
Platelets: 318 10*3/uL (ref 150.0–400.0)
RBC: 4.34 Mil/uL (ref 3.87–5.11)
RDW: 14.9 % (ref 11.5–15.5)
WBC: 5.9 10*3/uL (ref 4.0–10.5)

## 2018-03-21 LAB — BASIC METABOLIC PANEL
BUN: 8 mg/dL (ref 6–23)
CO2: 31 meq/L (ref 19–32)
CREATININE: 0.58 mg/dL (ref 0.40–1.20)
Calcium: 8.8 mg/dL (ref 8.4–10.5)
Chloride: 102 mEq/L (ref 96–112)
GFR: 139.17 mL/min (ref >60.00)
GLUCOSE: 105 mg/dL — AB (ref 70–99)
Potassium: 3.5 mEq/L (ref 3.5–5.1)
Sodium: 142 mEq/L (ref 135–145)

## 2018-03-21 LAB — IRON: Iron: 45 ug/dL (ref 42–145)

## 2018-03-21 NOTE — Progress Notes (Signed)
Subjective:    Patient ID: Marissa Bowman, female    DOB: 1966-05-17, 52 y.o.   MRN: [id number]  HPI  Marissa Bowman is a 52 yr old female who presents today for follow up.  1) HTN- she is maintained on amlodipine 10mg  and lisinopril 20mg . Denies CP/SOB and swelling and reports good compliance with medication.  BP Readings from Last 3 Encounters:  03/21/18 (!) 161/93  09/20/17 130/82  06/21/17 124/78   2)Iron deficiency Anemia- maintained on iron 325mg  bid.  Reports that she takes 2 tabs once daily.   Lab Results  Component Value Date   WBC 7.4 09/20/2017   HGB 11.5 (L) 09/20/2017   HCT 35.1 (L) 09/20/2017   MCV 80.7 09/20/2017   PLT 323.0 09/20/2017      Review of Systems    see HPI  Past Medical History:  Diagnosis Date  . Anemia    on iron  . Asthma    childhood  . Hypertension      Social History   Socioeconomic History  . Marital status: Single    Spouse name: Not on file  . Number of children: Not on file  . Years of education: Not on file  . Highest education level: Not on file  Occupational History  . Not on file  Social Needs  . Financial resource strain: Not on file  . Food insecurity:    Worry: Not on file    Inability: Not on file  . Transportation needs:    Medical: Not on file    Non-medical: Not on file  Tobacco Use  . Smoking status: Never Smoker  . Smokeless tobacco: Never Used  Substance and Sexual Activity  . Alcohol use: No  . Drug use: No  . Sexual activity: Not on file  Lifestyle  . Physical activity:    Days per week: Not on file    Minutes per session: Not on file  . Stress: Not on file  Relationships  . Social connections:    Talks on phone: Not on file    Gets together: Not on file    Attends religious service: Not on file    Active member of club or organization: Not on file    Attends meetings of clubs or organizations: Not on file    Relationship status: Not on file  . Intimate partner violence:    Fear of  current or ex partner: Not on file    Emotionally abused: Not on file    Physically abused: Not on file    Forced sexual activity: Not on file  Other Topics Concern  . Not on file  Social History Narrative   1 son- age 25   Unemployed, plans to sub with the [employer] [employer]   Associated degree   Lives with mother father/father and son   Has 1 dog lab/spaniel mix   Enjoys reading    Past Surgical History:  Procedure Laterality Date  . COLONOSCOPY    . COLONOSCOPY WITH PROPOFOL N/A 02/03/2017   Procedure: COLONOSCOPY WITH PROPOFOL;  Surgeon: Mauri Pole, MD;  Location: [facility];  Service: Endoscopy;  Laterality: N/A;  . NO PAST SURGERIES      Family History  Problem Relation Age of Onset  . Hypertension Mother   . Hyperlipidemia Mother   . Diabetes Mother   . Lung cancer Father   . Thyroid disease Sister   . Fibromyalgia Sister   . Colon cancer Neg Hx  Allergies  Allergen Reactions  . Latex     rash    Current Outpatient Medications on File Prior to Visit  Medication Sig Dispense Refill  . amLODipine (NORVASC) 10 MG tablet TAKE ONE TABLET BY MOUTH ONE TIME DAILY 30 tablet 4  . ferrous sulfate 325 (65 FE) MG tablet Take 325 mg by mouth 2 (two) times daily with a meal.     . lisinopril (PRINIVIL,ZESTRIL) 20 MG tablet Take 1 tablet (20 mg total) by mouth daily. 30 tablet 5   No current facility-administered medications on file prior to visit.     BP (!) 150/82   Pulse 65   Temp 97.8 F (36.6 C) (Oral)   Resp 16   Ht 5\' 4"  (1.626 m)   Wt (!) 320 lb 3.2 oz (145.2 kg)   SpO2 98%   BMI 54.96 kg/m    Objective:   Physical Exam  Constitutional: She appears well-developed and well-nourished.  Cardiovascular: Normal rate, regular rhythm and normal heart sounds.  No murmur heard. Pulmonary/Chest: Effort normal and breath sounds normal. No respiratory distress. She has no wheezes.  Psychiatric: She has a normal mood and affect. Her behavior is normal.  Judgment and thought content normal.          Assessment & Plan:  HTN- BP mildly elevated today.  Has been much better on previous visits. Plan to continue current meds. Obtain bmet. Repeat BP in 2 weeks at nurse visit. If still elevated will adjust meds.  Iron deficiency anemia- advised pt to change to 1 tab iron bid.  Check cbc, serum iron.

## 2018-04-01 ENCOUNTER — Other Ambulatory Visit: Payer: Self-pay | Admitting: Family

## 2018-04-01 DIAGNOSIS — I10 Essential (primary) hypertension: Secondary | ICD-10-CM

## 2018-04-04 ENCOUNTER — Encounter: Payer: Self-pay | Admitting: Family

## 2018-04-04 ENCOUNTER — Ambulatory Visit (INDEPENDENT_AMBULATORY_CARE_PROVIDER_SITE_OTHER): Payer: Self-pay | Admitting: Family

## 2018-04-04 VITALS — BP 160/84 | HR 68 | Temp 98.4°F | Resp 18 | Ht 64.0 in | Wt 312.8 lb

## 2018-04-04 DIAGNOSIS — I10 Essential (primary) hypertension: Secondary | ICD-10-CM

## 2018-04-04 MED ORDER — LISINOPRIL 40 MG PO TABS: 40.0000 mg | ORAL_TABLET | Freq: Every day | ORAL | 3 refills | Status: DC

## 2018-04-04 NOTE — Patient Instructions (Signed)
Please increase lisinopril to 40mg once daily.  

## 2018-04-04 NOTE — Progress Notes (Signed)
Subjective:    Patient ID: Marissa Bowman, female    DOB: [birth year], 52 y.o.   MRN: [id number]  HPI  Marissa Bowman is a 52 yr old female who presents today for follow up of her blood pressure. Current bp medication includes lisinopril 20mg  and amlodipine 10mg . She denies chest pain, shortness of breath or swelling. Reports good compliance with medication.   BP Readings from Last 3 Encounters:  04/04/18 (!) 160/84  03/21/18 (!) 150/82  09/20/17 130/82     Review of Systems See HPI  Past Medical History:  Diagnosis Date  . Anemia    on iron  . Asthma    childhood  . Hypertension      Social History   Socioeconomic History  . Marital status: Single    Spouse name: Not on file  . Number of children: Not on file  . Years of education: Not on file  . Highest education level: Not on file  Occupational History  . Not on file  Social Needs  . Financial resource strain: Not on file  . Food insecurity:    Worry: Not on file    Inability: Not on file  . Transportation needs:    Medical: Not on file    Non-medical: Not on file  Tobacco Use  . Smoking status: Never Smoker  . Smokeless tobacco: Never Used  Substance and Sexual Activity  . Alcohol use: No  . Drug use: No  . Sexual activity: Not on file  Lifestyle  . Physical activity:    Days per week: Not on file    Minutes per session: Not on file  . Stress: Not on file  Relationships  . Social connections:    Talks on phone: Not on file    Gets together: Not on file    Attends religious service: Not on file    Active member of club or organization: Not on file    Attends meetings of clubs or organizations: Not on file    Relationship status: Not on file  . Intimate partner violence:    Fear of current or ex partner: Not on file    Emotionally abused: Not on file    Physically abused: Not on file    Forced sexual activity: Not on file  Other Topics Concern  . Not on file  Social History Narrative   1 son-  age 8   Unemployed, plans to sub with the [employer]   Associated degree   Lives with mother father/father and son   Has 1 dog lab/spaniel mix   Enjoys reading    Past Surgical History:  Procedure Laterality Date  . COLONOSCOPY    . COLONOSCOPY WITH PROPOFOL N/A 02/03/2017   Procedure: COLONOSCOPY WITH PROPOFOL;  Surgeon: Mauri Pole, MD;  Location: [facility];  Service: Endoscopy;  Laterality: N/A;  . NO PAST SURGERIES      Family History  Problem Relation Age of Onset  . Hypertension Mother   . Hyperlipidemia Mother   . Diabetes Mother   . Lung cancer Father   . Thyroid disease Sister   . Fibromyalgia Sister   . Colon cancer Neg Hx     Allergies  Allergen Reactions  . Latex     rash    Current Outpatient Medications on File Prior to Visit  Medication Sig Dispense Refill  . amLODipine (NORVASC) 10 MG tablet TAKE ONE TABLET BY MOUTH ONE TIME DAILY 30 tablet 4  .  ferrous sulfate 325 (65 FE) MG tablet Take 325 mg by mouth 2 (two) times daily with a meal.      No current facility-administered medications on file prior to visit.     BP (!) 160/84 (BP Location: Right Arm) Comment (Cuff Size): thigh  Pulse 68   Temp 98.4 F (36.9 C) (Oral)   Resp 18   Ht 5\' 4"  (1.626 m)   Wt (!) 312 lb 12.8 oz (141.9 kg)   SpO2 98%   BMI 53.69 kg/m       Objective:   Physical Exam  Constitutional: She is oriented to person, place, and time. She appears well-developed and well-nourished.  Cardiovascular: Normal rate, regular rhythm and normal heart sounds.  No murmur heard. Pulmonary/Chest: Effort normal and breath sounds normal. No respiratory distress. She has no wheezes.  Neurological: She is alert and oriented to person, place, and time.  Skin: Skin is warm and dry.  Psychiatric: She has a normal mood and affect. Her behavior is normal. Judgment and thought content normal.          Assessment & Plan:  HTN- uncontrolled. Continue amlodipine 10mg . Increase  lisinopril from 20mg  to 40mg .  Follow up in 1 month.

## 2018-05-03 ENCOUNTER — Encounter: Payer: Self-pay | Admitting: Family

## 2018-05-03 ENCOUNTER — Ambulatory Visit (INDEPENDENT_AMBULATORY_CARE_PROVIDER_SITE_OTHER): Payer: Self-pay | Admitting: Family

## 2018-05-03 VITALS — BP 146/80 | HR 68 | Temp 98.0°F | Resp 18 | Ht 64.0 in | Wt 314.8 lb

## 2018-05-03 DIAGNOSIS — I10 Essential (primary) hypertension: Secondary | ICD-10-CM

## 2018-05-03 LAB — BASIC METABOLIC PANEL
BUN: 10 mg/dL (ref 6–23)
CHLORIDE: 103 meq/L (ref 96–112)
CO2: 31 mEq/L (ref 19–32)
Calcium: 8.9 mg/dL (ref 8.4–10.5)
Creatinine, Ser: 0.73 mg/dL (ref 0.40–1.20)
GFR: 106.68 mL/min (ref >60.00)
GLUCOSE: 101 mg/dL — AB (ref 70–99)
Potassium: 4.1 mEq/L (ref 3.5–5.1)
Sodium: 141 mEq/L (ref 135–145)

## 2018-05-03 NOTE — Progress Notes (Signed)
Subjective:    Patient ID: Marissa Bowman, female    DOB: [birth year], 52 y.o.   MRN: [id number]  HPI Patient is a 52 year old female who presents today for follow-up of her hypertension.  Last visit we increased her lisinopril dosing from 20 mg to 40 mg.  She continues amlodipine 10 mg.  She reports tolerating this dose without difficulty.   Review of Systems See HPI  Past Medical History:  Diagnosis Date  . Anemia    on iron  . Asthma    childhood  . Hypertension      Social History   Socioeconomic History  . Marital status: Single    Spouse name: Not on file  . Number of children: Not on file  . Years of education: Not on file  . Highest education level: Not on file  Occupational History  . Not on file  Social Needs  . Financial resource strain: Not on file  . Food insecurity:    Worry: Not on file    Inability: Not on file  . Transportation needs:    Medical: Not on file    Non-medical: Not on file  Tobacco Use  . Smoking status: Never Smoker  . Smokeless tobacco: Never Used  Substance and Sexual Activity  . Alcohol use: No  . Drug use: No  . Sexual activity: Not on file  Lifestyle  . Physical activity:    Days per week: Not on file    Minutes per session: Not on file  . Stress: Not on file  Relationships  . Social connections:    Talks on phone: Not on file    Gets together: Not on file    Attends religious service: Not on file    Active member of club or organization: Not on file    Attends meetings of clubs or organizations: Not on file    Relationship status: Not on file  . Intimate partner violence:    Fear of current or ex partner: Not on file    Emotionally abused: Not on file    Physically abused: Not on file    Forced sexual activity: Not on file  Other Topics Concern  . Not on file  Social History Narrative   1 son- age 25   Unemployed, plans to sub with the [employer]   Associated degree   Lives with mother father/father and son   Has 1 dog lab/spaniel mix   Enjoys reading    Past Surgical History:  Procedure Laterality Date  . COLONOSCOPY    . COLONOSCOPY WITH PROPOFOL N/A 02/03/2017   Procedure: COLONOSCOPY WITH PROPOFOL;  Surgeon: Mauri Pole, MD;  Location: [facility];  Service: Endoscopy;  Laterality: N/A;  . NO PAST SURGERIES      Family History  Problem Relation Age of Onset  . Hypertension Mother   . Hyperlipidemia Mother   . Diabetes Mother   . Lung cancer Father   . Thyroid disease Sister   . Fibromyalgia Sister   . Colon cancer Neg Hx     Allergies  Allergen Reactions  . Latex     rash    Current Outpatient Medications on File Prior to Visit  Medication Sig Dispense Refill  . amLODipine (NORVASC) 10 MG tablet TAKE ONE TABLET BY MOUTH ONE TIME DAILY 30 tablet 4  . ferrous sulfate 325 (65 FE) MG tablet Take 325 mg by mouth 2 (two) times daily with a meal.     .  lisinopril (PRINIVIL,ZESTRIL) 40 MG tablet Take 1 tablet (40 mg total) by mouth daily. 30 tablet 3   No current facility-administered medications on file prior to visit.     BP (!) 146/80 (BP Location: Right Arm) Comment (Cuff Size): thigh cuff  Pulse 68   Temp 98 F (36.7 C) (Oral)   Resp 18   Ht 5\' 4"  (1.626 m)   Wt (!) 314 lb 12.8 oz (142.8 kg)   SpO2 100%   BMI 54.04 kg/m       Objective:   Physical Exam  Constitutional: She appears well-developed and well-nourished.  Cardiovascular: Normal rate, regular rhythm and normal heart sounds.  No murmur heard. Pulmonary/Chest: Effort normal and breath sounds normal. No respiratory distress. She has no wheezes.  Psychiatric: Her behavior is normal. Judgment and thought content normal.  Tearful about her mother's health          Assessment & Plan:  HTN- BP is improving, still slightly above goal. We discussed adding a 3rd bp agent. She declines at this time. Reinforced importance of low sodium diet, weight loss.  Plan follow up in 3 months.  BP Readings from  Last 3 Encounters:  05/03/18 (!) 146/80  04/04/18 (!) 160/84  03/21/18 (!) 150/82

## 2018-05-03 NOTE — Patient Instructions (Signed)
Please stop at the lab for bloodwork today. Please schedule a follow up in 3 months with Melissa.

## 2018-05-03 NOTE — Progress Notes (Signed)
bmet  

## 2018-05-30 ENCOUNTER — Other Ambulatory Visit: Payer: Self-pay | Admitting: Family

## 2018-07-11 ENCOUNTER — Other Ambulatory Visit: Payer: Self-pay | Admitting: *Deleted

## 2018-07-11 MED ORDER — LISINOPRIL 40 MG PO TABS: 40.0000 mg | ORAL_TABLET | Freq: Every day | ORAL | 3 refills | Status: DC

## 2018-08-06 ENCOUNTER — Encounter: Payer: Self-pay | Admitting: Family

## 2018-08-06 ENCOUNTER — Ambulatory Visit (INDEPENDENT_AMBULATORY_CARE_PROVIDER_SITE_OTHER): Payer: Self-pay | Admitting: Family

## 2018-08-06 VITALS — BP 145/94 | HR 63 | Temp 97.9°F | Resp 16 | Ht 64.0 in | Wt 313.0 lb

## 2018-08-06 DIAGNOSIS — I10 Essential (primary) hypertension: Secondary | ICD-10-CM

## 2018-08-06 DIAGNOSIS — D509 Iron deficiency anemia, unspecified: Secondary | ICD-10-CM

## 2018-08-06 NOTE — Progress Notes (Signed)
Subjective:    Patient ID: Marissa Bowman, female    DOB: [birth year], 52 y.o.   MRN: [id number]  HPI  Patient is a 52 yr old female who presents today for follow up of hr hypertension.  Last visit blood pressure was elevated and we recommended adding a third bp agent. She declined. Current bp meds include amlodipine 10mg  and lisinopril 40mg . She states that she has been out of amlodipine for 2 weeks. Pharmacy didn't have it but she thinks it is now available and plans to restart.   BP Readings from Last 3 Encounters:  08/06/18 (!) 145/94  05/03/18 (!) 146/80  04/04/18 (!) 160/84   Iron deficiency anemia- continues iron supplement. Reports + constipation.  Uses stool softener prn.  Lab Results  Component Value Date   WBC 5.9 03/21/2018   HGB 11.1 (L) 03/21/2018   HCT 33.5 (L) 03/21/2018   MCV 77.1 (L) 03/21/2018   PLT 318.0 03/21/2018       Review of Systems See HPI  Past Medical History:  Diagnosis Date  . Anemia    on iron  . Asthma    childhood  . Hypertension      Social History   Socioeconomic History  . Marital status: Single    Spouse name: Not on file  . Number of children: Not on file  . Years of education: Not on file  . Highest education level: Not on file  Occupational History  . Not on file  Social Needs  . Financial resource strain: Not on file  . Food insecurity:    Worry: Not on file    Inability: Not on file  . Transportation needs:    Medical: Not on file    Non-medical: Not on file  Tobacco Use  . Smoking status: Never Smoker  . Smokeless tobacco: Never Used  Substance and Sexual Activity  . Alcohol use: No  . Drug use: No  . Sexual activity: Not on file  Lifestyle  . Physical activity:    Days per week: Not on file    Minutes per session: Not on file  . Stress: Not on file  Relationships  . Social connections:    Talks on phone: Not on file    Gets together: Not on file    Attends religious service: Not on file    Active  member of club or organization: Not on file    Attends meetings of clubs or organizations: Not on file    Relationship status: Not on file  . Intimate partner violence:    Fear of current or ex partner: Not on file    Emotionally abused: Not on file    Physically abused: Not on file    Forced sexual activity: Not on file  Other Topics Concern  . Not on file  Social History Narrative   1 son- age 31   Unemployed, plans to sub with the [employer]   Associated degree   Lives with mother father/father and son   Has 1 dog lab/spaniel mix   Enjoys reading    Past Surgical History:  Procedure Laterality Date  . COLONOSCOPY    . COLONOSCOPY WITH PROPOFOL N/A 02/03/2017   Procedure: COLONOSCOPY WITH PROPOFOL;  Surgeon: Mauri Pole, MD;  Location: [facility];  Service: Endoscopy;  Laterality: N/A;  . NO PAST SURGERIES      Family History  Problem Relation Age of Onset  . Hypertension Mother   . Hyperlipidemia  Mother   . Diabetes Mother   . Lung cancer Father   . Thyroid disease Sister   . Fibromyalgia Sister   . Colon cancer Neg Hx     Allergies  Allergen Reactions  . Latex     rash    Current Outpatient Medications on File Prior to Visit  Medication Sig Dispense Refill  . amLODipine (NORVASC) 10 MG tablet TAKE ONE TABLET BY MOUTH ONE TIME DAILY 30 tablet 5  . ferrous sulfate 325 (65 FE) MG tablet Take 325 mg by mouth 2 (two) times daily with a meal.     . lisinopril (PRINIVIL,ZESTRIL) 40 MG tablet Take 1 tablet (40 mg total) by mouth daily. 30 tablet 3   No current facility-administered medications on file prior to visit.     BP (!) 145/94 (BP Location: Right Arm, Patient Position: Sitting, Cuff Size: Large)   Pulse 63   Temp 97.9 F (36.6 C) (Oral)   Resp 16   Ht 5\' 4"  (1.626 m)   Wt (!) 313 lb (142 kg)   SpO2 99%   BMI 53.73 kg/m       Objective:   Physical Exam  Constitutional: She is oriented to person, place, and time. She appears well-developed  and well-nourished.  Cardiovascular: Normal rate, regular rhythm and normal heart sounds.  No murmur heard. Pulmonary/Chest: Effort normal and breath sounds normal. No respiratory distress. She has no wheezes.  Musculoskeletal: She exhibits no edema.  Neurological: She is alert and oriented to person, place, and time.  Skin: Skin is warm and dry.  Psychiatric: She has a normal mood and affect. Her behavior is normal. Judgment and thought content normal.          Assessment & Plan:  HTN- bp uncontrolled. Restart amlodipine, continue ace. Follow up in 2 weeks with nurse for bp recheck.  Iron deficiency anemia- obtain cbc, iron level. Continue iron supplement.

## 2018-08-06 NOTE — Patient Instructions (Signed)
Please restart amlodipine.    

## 2018-08-12 ENCOUNTER — Encounter: Payer: Self-pay | Admitting: Family

## 2018-08-21 ENCOUNTER — Ambulatory Visit (INDEPENDENT_AMBULATORY_CARE_PROVIDER_SITE_OTHER): Payer: Self-pay | Admitting: Family Medicine

## 2018-08-21 VITALS — BP 133/69 | HR 77

## 2018-08-21 DIAGNOSIS — I10 Essential (primary) hypertension: Secondary | ICD-10-CM

## 2018-08-21 NOTE — Progress Notes (Signed)
Pre visit review using our clinic tool,if applicable. No additional management support is needed unless otherwise documented below in the visit note.   Pt here for Blood pressure check per order from [name], NP  Pt currently takes:Amlodipine 10 mg qd (Restarted)   Pt reports compliance with medication. Took BP medication this am.   BP today @ =  133/69 HR = 77  Pt advised per Dr. Charlett Blake to continue taking Amlodipine 10 mg daily. Patient scheduled for 1 month with provider for follow up visit.  nursing blood pressure check note reviewed. Agree with documention and plan.

## 2018-09-19 ENCOUNTER — Telehealth: Payer: Self-pay

## 2018-09-19 ENCOUNTER — Ambulatory Visit: Payer: Self-pay

## 2018-09-24 ENCOUNTER — Ambulatory Visit (INDEPENDENT_AMBULATORY_CARE_PROVIDER_SITE_OTHER): Payer: Self-pay | Admitting: Family

## 2018-09-24 ENCOUNTER — Encounter: Payer: Self-pay | Admitting: Family

## 2018-09-24 VITALS — BP 139/69 | HR 70 | Temp 98.7°F | Resp 16 | Ht 64.0 in | Wt 312.0 lb

## 2018-09-24 DIAGNOSIS — I10 Essential (primary) hypertension: Secondary | ICD-10-CM

## 2018-09-24 DIAGNOSIS — D509 Iron deficiency anemia, unspecified: Secondary | ICD-10-CM

## 2018-09-24 DIAGNOSIS — M5416 Radiculopathy, lumbar region: Secondary | ICD-10-CM

## 2018-09-24 MED ORDER — MELOXICAM 7.5 MG PO TABS: 7.5000 mg | ORAL_TABLET | Freq: Every day | ORAL | 0 refills | Status: DC

## 2018-09-24 NOTE — Patient Instructions (Signed)
Please complete lab work prior to leaving. Start meloxicam once daily. Call if tingling worsens or if not improved in 2 weeks.

## 2018-09-24 NOTE — Progress Notes (Signed)
Subjective:    Patient ID: Marissa Bowman, female    DOB: [birth year], 52 y.o.   MRN: [id number]  HPI  Patient is a 52 year old female who presents today for follow-up. Denies LE edema.   Hypertension- last visit we restarted her amlodipine.  Follow-up blood pressure was improved and nurse visit. BP Readings from Last 3 Encounters:  09/24/18 139/69  08/21/18 133/69  08/06/18 (!) 145/94   Iron deficiency anemia- reports that she reports that she take iron bid.  Does have constipation which is relieved by colace.  Lab Results  Component Value Date   WBC 5.9 03/21/2018   HGB 11.1 (L) 03/21/2018   HCT 33.5 (L) 03/21/2018   MCV 77.1 (L) 03/21/2018   PLT 318.0 03/21/2018   Reports numbness in the right anterior thigh. Started before thanksgiving.  Denies back pain.  "feels like pins sticking."   She denies weakness in the lower extremitis.   Review of Systems See HPI  Past Medical History:  Diagnosis Date  . Anemia    on iron  . Asthma    childhood  . Hypertension      Social History   Socioeconomic History  . Marital status: Single    Spouse name: Not on file  . Number of children: Not on file  . Years of education: Not on file  . Highest education level: Not on file  Occupational History  . Not on file  Social Needs  . Financial resource strain: Not on file  . Food insecurity:    Worry: Not on file    Inability: Not on file  . Transportation needs:    Medical: Not on file    Non-medical: Not on file  Tobacco Use  . Smoking status: Never Smoker  . Smokeless tobacco: Never Used  Substance and Sexual Activity  . Alcohol use: No  . Drug use: No  . Sexual activity: Not on file  Lifestyle  . Physical activity:    Days per week: Not on file    Minutes per session: Not on file  . Stress: Not on file  Relationships  . Social connections:    Talks on phone: Not on file    Gets together: Not on file    Attends religious service: Not on file    Active member  of club or organization: Not on file    Attends meetings of clubs or organizations: Not on file    Relationship status: Not on file  . Intimate partner violence:    Fear of current or ex partner: Not on file    Emotionally abused: Not on file    Physically abused: Not on file    Forced sexual activity: Not on file  Other Topics Concern  . Not on file  Social History Narrative   1 son- age 31   Unemployed, plans to sub with the [employer]   Associated degree   Lives with mother father/father and son   Has 1 dog lab/spaniel mix   Enjoys reading    Past Surgical History:  Procedure Laterality Date  . COLONOSCOPY    . COLONOSCOPY WITH PROPOFOL N/A 02/03/2017   Procedure: COLONOSCOPY WITH PROPOFOL;  Surgeon: Mauri Pole, MD;  Location: [facility];  Service: Endoscopy;  Laterality: N/A;  . NO PAST SURGERIES      Family History  Problem Relation Age of Onset  . Hypertension Mother   . Hyperlipidemia Mother   . Diabetes Mother   .  Lung cancer Father   . Thyroid disease Sister   . Fibromyalgia Sister   . Colon cancer Neg Hx     Allergies  Allergen Reactions  . Latex     rash    Current Outpatient Medications on File Prior to Visit  Medication Sig Dispense Refill  . amLODipine (NORVASC) 10 MG tablet TAKE ONE TABLET BY MOUTH ONE TIME DAILY 30 tablet 5  . ferrous sulfate 325 (65 FE) MG tablet Take 325 mg by mouth 2 (two) times daily with a meal.     . lisinopril (PRINIVIL,ZESTRIL) 40 MG tablet Take 1 tablet (40 mg total) by mouth daily. 30 tablet 3   No current facility-administered medications on file prior to visit.     BP 139/69 (BP Location: Right Arm, Patient Position: Sitting, Cuff Size: Large)   Pulse 70   Temp 98.7 F (37.1 C) (Oral)   Resp 16   Ht 5\' 4"  (1.626 m)   Wt (!) 312 lb (141.5 kg)   SpO2 100%   BMI 53.55 kg/m       Objective:   Physical Exam  Constitutional: She is oriented to person, place, and time. She appears well-developed and  well-nourished.  Neck: Neck supple. No thyromegaly present.  Cardiovascular: Normal rate, regular rhythm and normal heart sounds.  No murmur heard. Pulmonary/Chest: Effort normal and breath sounds normal. No respiratory distress. She has no wheezes.  Neurological: She is alert and oriented to person, place, and time.  Reflex Scores:      Patellar reflexes are 2+ on the right side and 2+ on the left side. Bilateral LE strength is 5/5  Skin: Skin is warm and dry.  Psychiatric: She has a normal mood and affect. Her behavior is normal. Judgment and thought content normal.          Assessment & Plan:  HTN- bp is stable on current meds.  Will continue current medications. Obtain follow up bmet.  Iron deficiency Anemia- obtain follow up iron level and cbc.   Lumbar radiculopathy- trial of meloxicam. Pt is advised to call if new/worsening symptoms or of symptoms are not improved in 2 weeks.

## 2018-09-25 LAB — BASIC METABOLIC PANEL
BUN: 13 mg/dL (ref 6–23)
CO2: 29 mEq/L (ref 19–32)
Calcium: 8.8 mg/dL (ref 8.4–10.5)
Chloride: 102 mEq/L (ref 96–112)
Creatinine, Ser: 0.73 mg/dL (ref 0.40–1.20)
GFR: 106.52 mL/min (ref >60.00)
Glucose, Bld: 74 mg/dL (ref 70–99)
Potassium: 3.9 mEq/L (ref 3.5–5.1)
Sodium: 139 mEq/L (ref 135–145)

## 2018-09-25 LAB — IRON: Iron: 28 ug/dL — ABNORMAL LOW (ref 42–145)

## 2018-09-25 LAB — CBC WITH DIFFERENTIAL/PLATELET
Basophils Absolute: 0 10*3/uL (ref 0.0–0.1)
Basophils Relative: 0.6 % (ref 0.0–3.0)
Eosinophils Absolute: 0.2 10*3/uL (ref 0.0–0.7)
Eosinophils Relative: 2.1 % (ref 0.0–5.0)
HCT: 35.9 % — ABNORMAL LOW (ref 36.0–46.0)
Hemoglobin: 11.9 g/dL — ABNORMAL LOW (ref 12.0–15.0)
LYMPHS ABS: 1.6 10*3/uL (ref 0.7–4.0)
Lymphocytes Relative: 21.1 % (ref 12.0–46.0)
MCHC: 33.1 g/dL (ref 30.0–36.0)
MCV: 78.6 fl (ref 78.0–100.0)
MONOS PCT: 5.1 % (ref 3.0–12.0)
Monocytes Absolute: 0.4 10*3/uL (ref 0.1–1.0)
NEUTROS PCT: 71.1 % (ref 43.0–77.0)
Neutro Abs: 5.4 10*3/uL (ref 1.4–7.7)
Platelets: 356 10*3/uL (ref 150.0–400.0)
RBC: 4.57 Mil/uL (ref 3.87–5.11)
RDW: 15.8 % — ABNORMAL HIGH (ref 11.5–15.5)
WBC: 7.6 10*3/uL (ref 4.0–10.5)

## 2018-09-26 ENCOUNTER — Encounter: Payer: Self-pay | Admitting: Family

## 2018-10-11 ENCOUNTER — Telehealth: Payer: Self-pay

## 2018-10-11 DIAGNOSIS — I10 Essential (primary) hypertension: Secondary | ICD-10-CM

## 2018-10-11 MED ORDER — LISINOPRIL 40 MG PO TABS: 40.0000 mg | ORAL_TABLET | Freq: Every day | ORAL | 3 refills | Status: DC

## 2018-10-11 NOTE — Telephone Encounter (Signed)
Received rx request for lisinopril from public pharmacy, refilled per protocol.

## 2018-10-12 ENCOUNTER — Encounter: Payer: Self-pay | Admitting: Family

## 2018-10-15 ENCOUNTER — Other Ambulatory Visit: Payer: Self-pay | Admitting: Family

## 2018-10-15 MED ORDER — MELOXICAM 7.5 MG PO TABS: 7.5000 mg | ORAL_TABLET | Freq: Every day | ORAL | 0 refills | Status: DC

## 2018-10-16 MED ORDER — MELOXICAM 7.5 MG PO TABS: 7.5000 mg | ORAL_TABLET | Freq: Every day | ORAL | 0 refills | Status: DC

## 2018-11-26 ENCOUNTER — Other Ambulatory Visit: Payer: Self-pay | Admitting: Family

## 2018-12-24 ENCOUNTER — Ambulatory Visit: Payer: Self-pay | Admitting: Family

## 2018-12-25 ENCOUNTER — Encounter: Payer: Self-pay | Admitting: Family

## 2018-12-25 ENCOUNTER — Ambulatory Visit (INDEPENDENT_AMBULATORY_CARE_PROVIDER_SITE_OTHER): Payer: BC Managed Care – PPO | Admitting: Family

## 2018-12-25 VITALS — BP 145/63 | HR 72 | Temp 98.4°F | Resp 16 | Ht 64.0 in | Wt 311.0 lb

## 2018-12-25 DIAGNOSIS — D509 Iron deficiency anemia, unspecified: Secondary | ICD-10-CM | POA: Diagnosis not present

## 2018-12-25 DIAGNOSIS — I10 Essential (primary) hypertension: Secondary | ICD-10-CM

## 2018-12-25 DIAGNOSIS — M5416 Radiculopathy, lumbar region: Secondary | ICD-10-CM | POA: Diagnosis not present

## 2018-12-25 DIAGNOSIS — Z0000 Encounter for general adult medical examination without abnormal findings: Secondary | ICD-10-CM | POA: Diagnosis not present

## 2018-12-25 NOTE — Progress Notes (Signed)
Subjective:    Patient ID: Marissa Bowman, female    DOB: 01-25-1966, 53 y.o.   MRN: [id number]  HPI  Patient is a 53 yr old female who presents today for follow up.  HTN- reports good compliance with medication.  BP Readings from Last 3 Encounters:  12/25/18 (!) 145/63  09/24/18 139/69  08/21/18 133/69   Lumbar radiculopathy- last visit we gave a trial of meloxicam. Reports that she still has some pain radiating down the right leg but it is intermittent and less frequent.   Iron deficiency anemia- She continues iron supplement bid.   Lab Results  Component Value Date   WBC 7.6 09/24/2018   HGB 11.9 (L) 09/24/2018   HCT 35.9 (L) 09/24/2018   MCV 78.6 09/24/2018   PLT 356.0 09/24/2018     Review of Systems See HPI  Past Medical History:  Diagnosis Date  . Anemia    on iron  . Asthma    childhood  . Hypertension      Social History   Socioeconomic History  . Marital status: Single    Spouse name: Not on file  . Number of children: Not on file  . Years of education: Not on file  . Highest education level: Not on file  Occupational History  . Not on file  Social Needs  . Financial resource strain: Not on file  . Food insecurity:    Worry: Not on file    Inability: Not on file  . Transportation needs:    Medical: Not on file    Non-medical: Not on file  Tobacco Use  . Smoking status: Never Smoker  . Smokeless tobacco: Never Used  Substance and Sexual Activity  . Alcohol use: No  . Drug use: No  . Sexual activity: Not on file  Lifestyle  . Physical activity:    Days per week: Not on file    Minutes per session: Not on file  . Stress: Not on file  Relationships  . Social connections:    Talks on phone: Not on file    Gets together: Not on file    Attends religious service: Not on file    Active member of club or organization: Not on file    Attends meetings of clubs or organizations: Not on file    Relationship status: Not on file  . Intimate  partner violence:    Fear of current or ex partner: Not on file    Emotionally abused: Not on file    Physically abused: Not on file    Forced sexual activity: Not on file  Other Topics Concern  . Not on file  Social History Narrative   1 son- age 72   Unemployed, plans to sub with the [employer]   Associated degree   Lives with mother father/father and son   Has 1 dog lab/spaniel mix   Enjoys reading    Past Surgical History:  Procedure Laterality Date  . COLONOSCOPY    . COLONOSCOPY WITH PROPOFOL N/A 02/03/2017   Procedure: COLONOSCOPY WITH PROPOFOL;  Surgeon: Mauri Pole, MD;  Location: [facility];  Service: Endoscopy;  Laterality: N/A;  . NO PAST SURGERIES      Family History  Problem Relation Age of Onset  . Hypertension Mother   . Hyperlipidemia Mother   . Diabetes Mother   . Lung cancer Father   . Thyroid disease Sister   . Fibromyalgia Sister   . Colon cancer Neg  Hx     Allergies  Allergen Reactions  . Latex     rash    Current Outpatient Medications on File Prior to Visit  Medication Sig Dispense Refill  . amLODipine (NORVASC) 10 MG tablet TAKE ONE TABLET BY MOUTH ONE TIME DAILY 30 tablet 5  . ferrous sulfate 325 (65 FE) MG tablet Take 325 mg by mouth 2 (two) times daily with a meal.     . lisinopril (PRINIVIL,ZESTRIL) 40 MG tablet Take 1 tablet (40 mg total) by mouth daily. 30 tablet 3   No current facility-administered medications on file prior to visit.     BP (!) 145/63 (BP Location: Right Arm, Patient Position: Sitting, Cuff Size: Large)   Pulse 72   Temp 98.4 F (36.9 C) (Oral)   Resp 16   Ht 5\' 4"  (1.626 m)   Wt (!) 311 lb (141.1 kg)   SpO2 100%   BMI 53.38 kg/m       Objective:   Physical Exam Constitutional:      Appearance: She is well-developed.  Neck:     Musculoskeletal: Neck supple.     Thyroid: No thyromegaly.  Cardiovascular:     Rate and Rhythm: Normal rate and regular rhythm.     Heart sounds: Normal heart  sounds. No murmur.  Pulmonary:     Effort: Pulmonary effort is normal. No respiratory distress.     Breath sounds: Normal breath sounds. No wheezing.  Skin:    General: Skin is warm and dry.  Neurological:     Mental Status: She is alert and oriented to person, place, and time.  Psychiatric:        Behavior: Behavior normal.        Thought Content: Thought content normal.        Judgment: Judgment normal.           Assessment & Plan:  Iron Deficiency anemia-we will check follow-up CBC, serum iron and ferritin.  Continue twice daily iron.  Hypertension-follow-up blood pressure manual recheck was 150/75.  Discussed addition of metoprolol however she declines any further blood pressure adjustment at this time and wishes to focus on low-sodium diet and weight loss.  Plan to recheck blood pressure in 3 months.  Lumbar radiculopathy-overall it has improved some.  Recommended continuation of as needed meloxicam and to call if symptoms worsen.  Patient is comfortable with this plan.

## 2018-12-25 NOTE — Patient Instructions (Signed)
Please work on low sodium diet, exercise and weight loss.

## 2018-12-26 ENCOUNTER — Telehealth: Payer: Self-pay | Admitting: Family

## 2018-12-26 LAB — CBC WITH DIFFERENTIAL/PLATELET
Basophils Absolute: 0.1 10*3/uL (ref 0.0–0.1)
Basophils Relative: 0.7 % (ref 0.0–3.0)
Eosinophils Absolute: 0.2 10*3/uL (ref 0.0–0.7)
Eosinophils Relative: 3.1 % (ref 0.0–5.0)
HCT: 33.5 % — ABNORMAL LOW (ref 36.0–46.0)
HEMOGLOBIN: 11 g/dL — AB (ref 12.0–15.0)
LYMPHS PCT: 18.3 % (ref 12.0–46.0)
Lymphs Abs: 1.4 10*3/uL (ref 0.7–4.0)
MCHC: 32.8 g/dL (ref 30.0–36.0)
MCV: 80.5 fl (ref 78.0–100.0)
Monocytes Absolute: 0.5 10*3/uL (ref 0.1–1.0)
Monocytes Relative: 6.7 % (ref 3.0–12.0)
Neutro Abs: 5.4 10*3/uL (ref 1.4–7.7)
Neutrophils Relative %: 71.2 % (ref 43.0–77.0)
Platelets: 339 10*3/uL (ref 150.0–400.0)
RBC: 4.16 Mil/uL (ref 3.87–5.11)
RDW: 14.9 % (ref 11.5–15.5)
WBC: 7.6 10*3/uL (ref 4.0–10.5)

## 2018-12-26 LAB — FERRITIN: Ferritin: 166.9 ng/mL (ref 10.0–291.0)

## 2018-12-26 LAB — IRON: Iron: 35 ug/dL — ABNORMAL LOW (ref 42–145)

## 2018-12-26 NOTE — Telephone Encounter (Signed)
Iron still a little low continue bid iron supplement. I would also like her to complete IFOB please. Dx iron def anemia.

## 2018-12-27 ENCOUNTER — Other Ambulatory Visit: Payer: Self-pay

## 2018-12-27 DIAGNOSIS — D509 Iron deficiency anemia, unspecified: Secondary | ICD-10-CM

## 2018-12-27 NOTE — Telephone Encounter (Signed)
Results given to patient, advised to come by the office to pick up IFOB test kit and to continue iron. Orders placed as future.

## 2018-12-27 NOTE — Progress Notes (Signed)
i

## 2019-02-26 DIAGNOSIS — M722 Plantar fascial fibromatosis: Secondary | ICD-10-CM | POA: Insufficient documentation

## 2019-03-12 ENCOUNTER — Ambulatory Visit (HOSPITAL_BASED_OUTPATIENT_CLINIC_OR_DEPARTMENT_OTHER)
Admission: RE | Admit: 2019-03-12 | Discharge: 2019-03-12 | Disposition: A | Discharge status: Home / Self Care | Payer: BC Managed Care – PPO | Source: Ambulatory Visit | Attending: Family | Admitting: Family

## 2019-03-12 ENCOUNTER — Encounter (HOSPITAL_BASED_OUTPATIENT_CLINIC_OR_DEPARTMENT_OTHER): Payer: Self-pay

## 2019-03-12 ENCOUNTER — Other Ambulatory Visit: Payer: Self-pay

## 2019-03-12 DIAGNOSIS — Z0000 Encounter for general adult medical examination without abnormal findings: Secondary | ICD-10-CM | POA: Insufficient documentation

## 2019-03-12 DIAGNOSIS — Z1231 Encounter for screening mammogram for malignant neoplasm of breast: Secondary | ICD-10-CM | POA: Insufficient documentation

## 2019-03-29 ENCOUNTER — Ambulatory Visit: Payer: BC Managed Care – PPO | Admitting: Family

## 2019-04-09 ENCOUNTER — Other Ambulatory Visit: Payer: Self-pay

## 2019-04-09 ENCOUNTER — Encounter: Payer: Self-pay | Admitting: Family

## 2019-04-09 ENCOUNTER — Ambulatory Visit: Payer: BC Managed Care – PPO | Admitting: Family

## 2019-04-09 VITALS — BP 144/72 | HR 78 | Temp 99.2°F | Resp 16 | Ht 64.0 in | Wt 315.0 lb

## 2019-04-09 DIAGNOSIS — D509 Iron deficiency anemia, unspecified: Secondary | ICD-10-CM

## 2019-04-09 DIAGNOSIS — I10 Essential (primary) hypertension: Secondary | ICD-10-CM

## 2019-04-09 MED ORDER — LISINOPRIL 20 MG PO TABS: 20.0000 mg | ORAL_TABLET | Freq: Every day | ORAL | 3 refills | Status: DC

## 2019-04-09 NOTE — Progress Notes (Signed)
Subjective:    Patient ID: Marissa Bowman, female    DOB: [birth year], 53 y.o.   MRN: [id number]  HPI  Patient is a 53 yr old female who presents today for follow up.   HTN- maintained on amlodipine, reports that she ran out of lisinopril so she stopped taking.  BP Readings from Last 3 Encounters:  04/09/19 (!) 144/72  12/25/18 (!) 145/63  09/24/18 139/69   Iron deficiency anemia- maintained on iron.  Takes it bid.  Reports that she takes a stool softener for constipation which helps.  Lab Results  Component Value Date   WBC 7.6 12/25/2018   HGB 11.0 (L) 12/25/2018   HCT 33.5 (L) 12/25/2018   MCV 80.5 12/25/2018   PLT 339.0 12/25/2018     Review of Systems    see HPI  Past Medical History:  Diagnosis Date  . Anemia    on iron  . Asthma    childhood  . Hypertension      Social History   Socioeconomic History  . Marital status: Single    Spouse name: Not on file  . Number of children: Not on file  . Years of education: Not on file  . Highest education level: Not on file  Occupational History  . Not on file  Social Needs  . Financial resource strain: Not on file  . Food insecurity    Worry: Not on file    Inability: Not on file  . Transportation needs    Medical: Not on file    Non-medical: Not on file  Tobacco Use  . Smoking status: Never Smoker  . Smokeless tobacco: Never Used  Substance and Sexual Activity  . Alcohol use: No  . Drug use: No  . Sexual activity: Not on file  Lifestyle  . Physical activity    Days per week: Not on file    Minutes per session: Not on file  . Stress: Not on file  Relationships  . Social Herbalist on phone: Not on file    Gets together: Not on file    Attends religious service: Not on file    Active member of club or organization: Not on file    Attends meetings of clubs or organizations: Not on file    Relationship status: Not on file  . Intimate partner violence    Fear of current or ex partner: Not  on file    Emotionally abused: Not on file    Physically abused: Not on file    Forced sexual activity: Not on file  Other Topics Concern  . Not on file  Social History Narrative   1 son- age 44   Unemployed, plans to sub with the [employer]   Associated degree   Lives with mother father/father and son   Has 1 dog lab/spaniel mix   Enjoys reading    Past Surgical History:  Procedure Laterality Date  . COLONOSCOPY    . COLONOSCOPY WITH PROPOFOL N/A 02/03/2017   Procedure: COLONOSCOPY WITH PROPOFOL;  Surgeon: Mauri Pole, MD;  Location: [facility];  Service: Endoscopy;  Laterality: N/A;  . NO PAST SURGERIES      Family History  Problem Relation Age of Onset  . Hypertension Mother   . Hyperlipidemia Mother   . Diabetes Mother   . Lung cancer Father   . Thyroid disease Sister   . Fibromyalgia Sister   . Colon cancer Neg Hx  Allergies  Allergen Reactions  . Latex     rash    Current Outpatient Medications on File Prior to Visit  Medication Sig Dispense Refill  . amLODipine (NORVASC) 10 MG tablet TAKE ONE TABLET BY MOUTH ONE TIME DAILY 30 tablet 5  . ferrous sulfate 325 (65 FE) MG tablet Take 325 mg by mouth 2 (two) times daily with a meal.      No current facility-administered medications on file prior to visit.     BP (!) 144/72 (BP Location: Right Arm, Patient Position: Sitting, Cuff Size: Large)   Pulse 78   Temp 99.2 F (37.3 C) (Oral)   Resp 16   Ht 5\' 4"  (1.626 m)   Wt (!) 315 lb (142.9 kg)   SpO2 100%   BMI 54.07 kg/m    Objective:   Physical Exam Constitutional:      Appearance: She is well-developed.  Neck:     Musculoskeletal: Neck supple.     Thyroid: No thyromegaly.  Cardiovascular:     Rate and Rhythm: Normal rate and regular rhythm.     Heart sounds: Normal heart sounds. No murmur.  Pulmonary:     Effort: Pulmonary effort is normal. No respiratory distress.     Breath sounds: Normal breath sounds. No wheezing.  Skin:     General: Skin is warm and dry.  Neurological:     Mental Status: She is alert and oriented to person, place, and time.  Psychiatric:        Behavior: Behavior normal.        Thought Content: Thought content normal.        Judgment: Judgment normal.           Assessment & Plan:  HTN- bp is slightly above goal. Will restart lisinopril at 20mg  rather than 40mg .  Continue amlodipine.    Iron deficiency anemia- clinically stale on iron.  Plan follow up labs next visit.

## 2019-04-09 NOTE — Patient Instructions (Signed)
Restart lisinopril at 20mg .

## 2019-05-07 ENCOUNTER — Ambulatory Visit: Payer: BC Managed Care – PPO | Admitting: Family

## 2019-05-07 ENCOUNTER — Other Ambulatory Visit: Payer: Self-pay

## 2019-05-07 ENCOUNTER — Encounter: Payer: Self-pay | Admitting: Family

## 2019-05-07 VITALS — BP 160/70 | HR 77 | Temp 98.7°F | Resp 16 | Wt 319.2 lb

## 2019-05-07 DIAGNOSIS — I10 Essential (primary) hypertension: Secondary | ICD-10-CM

## 2019-05-07 MED ORDER — LISINOPRIL 40 MG PO TABS: 40.0000 mg | ORAL_TABLET | Freq: Every day | ORAL | 3 refills | Status: DC

## 2019-05-07 NOTE — Progress Notes (Signed)
Subjective:    Patient ID: Marissa Bowman, female    DOB: 12-Dec-1965, 53 y.o.   MRN: [id number]  HPI  Patient is a 53 yr old female who presents today for follow up.  HTN- last visit pt's bp looked ok and she had been out of lisinopril. We restarted dose at 20mg . Reports tolerating medication without difficulty.   BP Readings from Last 3 Encounters:  05/07/19 (!) 160/70  04/09/19 (!) 144/72  12/25/18 (!) 145/63    Review of Systems See HPI  Past Medical History:  Diagnosis Date  . Anemia    on iron  . Asthma    childhood  . Hypertension      Social History   Socioeconomic History  . Marital status: Single    Spouse name: Not on file  . Number of children: Not on file  . Years of education: Not on file  . Highest education level: Not on file  Occupational History  . Not on file  Social Needs  . Financial resource strain: Not on file  . Food insecurity    Worry: Not on file    Inability: Not on file  . Transportation needs    Medical: Not on file    Non-medical: Not on file  Tobacco Use  . Smoking status: Never Smoker  . Smokeless tobacco: Never Used  Substance and Sexual Activity  . Alcohol use: No  . Drug use: No  . Sexual activity: Not on file  Lifestyle  . Physical activity    Days per week: Not on file    Minutes per session: Not on file  . Stress: Not on file  Relationships  . Social Herbalist on phone: Not on file    Gets together: Not on file    Attends religious service: Not on file    Active member of club or organization: Not on file    Attends meetings of clubs or organizations: Not on file    Relationship status: Not on file  . Intimate partner violence    Fear of current or ex partner: Not on file    Emotionally abused: Not on file    Physically abused: Not on file    Forced sexual activity: Not on file  Other Topics Concern  . Not on file  Social History Narrative   1 son- age 27   Unemployed, plans to sub with the  [employer]   Associated degree   Lives with mother father/father and son   Has 1 dog lab/spaniel mix   Enjoys reading    Past Surgical History:  Procedure Laterality Date  . COLONOSCOPY    . COLONOSCOPY WITH PROPOFOL N/A 02/03/2017   Procedure: COLONOSCOPY WITH PROPOFOL;  Surgeon: Mauri Pole, MD;  Location: [facility];  Service: Endoscopy;  Laterality: N/A;  . NO PAST SURGERIES      Family History  Problem Relation Age of Onset  . Hypertension Mother   . Hyperlipidemia Mother   . Diabetes Mother   . Lung cancer Father   . Thyroid disease Sister   . Fibromyalgia Sister   . Colon cancer Neg Hx     Allergies  Allergen Reactions  . Latex     rash    Current Outpatient Medications on File Prior to Visit  Medication Sig Dispense Refill  . amLODipine (NORVASC) 10 MG tablet TAKE ONE TABLET BY MOUTH ONE TIME DAILY 30 tablet 5  . ferrous sulfate 325 (  65 FE) MG tablet Take 325 mg by mouth 2 (two) times daily with a meal.      No current facility-administered medications on file prior to visit.     BP (!) 160/70 (BP Location: Left Arm, Patient Position: Sitting, Cuff Size: Large)   Pulse 77   Temp 98.7 F (37.1 C) (Oral)   Resp 16   Wt (!) 319 lb 3.2 oz (144.8 kg)   SpO2 100%   BMI 54.79 kg/m       Objective:   Physical Exam Constitutional:      Appearance: She is well-developed.  Cardiovascular:     Rate and Rhythm: Normal rate and regular rhythm.     Heart sounds: Normal heart sounds. No murmur.  Pulmonary:     Effort: Pulmonary effort is normal. No respiratory distress.     Breath sounds: Normal breath sounds. No wheezing.  Psychiatric:        Behavior: Behavior normal.        Thought Content: Thought content normal.        Judgment: Judgment normal.           Assessment & Plan:  HTN- uncontrolled. Increase lisinopril from 20mg  to 40mg . Check follow up bmet today.

## 2019-05-07 NOTE — Patient Instructions (Signed)
Please increase lisinopril from 20mg  to 40mg .  Complete lab work prior to leaving.

## 2019-05-07 NOTE — Addendum Note (Signed)
Addended by: Caffie Pinto on: 05/07/2019 04:09 PM   Modules accepted: Orders

## 2019-05-09 ENCOUNTER — Telehealth: Payer: Self-pay | Admitting: Family

## 2019-05-09 DIAGNOSIS — I10 Essential (primary) hypertension: Secondary | ICD-10-CM

## 2019-05-09 NOTE — Telephone Encounter (Signed)
Please have pt reschedule for another day at our lab if she does not wish to go to [place].

## 2019-05-09 NOTE — Telephone Encounter (Signed)
-----   Message from Caffie Pinto sent at 05/09/2019  7:07 AM EDT ----- Regarding: Lab Orders I was unable to get Mrs. Butlers blood on Tuesday, I told her she could try [facility], they have more Lab Techs there but she did not want to go to [place]. I told her I was not sure how long she could wait before coming back so will you let her know what her options are please.

## 2019-05-10 NOTE — Telephone Encounter (Signed)
Patient scheduled for Monday.

## 2019-05-13 ENCOUNTER — Other Ambulatory Visit (INDEPENDENT_AMBULATORY_CARE_PROVIDER_SITE_OTHER): Payer: BC Managed Care – PPO

## 2019-05-13 ENCOUNTER — Other Ambulatory Visit: Payer: Self-pay

## 2019-05-13 DIAGNOSIS — I10 Essential (primary) hypertension: Secondary | ICD-10-CM

## 2019-05-14 ENCOUNTER — Telehealth: Payer: Self-pay | Admitting: Family

## 2019-05-14 LAB — BASIC METABOLIC PANEL
BUN: 8 mg/dL (ref 6–23)
CO2: 30 mEq/L (ref 19–32)
Calcium: 8.7 mg/dL (ref 8.4–10.5)
Chloride: 101 mEq/L (ref 96–112)
Creatinine, Ser: 0.69 mg/dL (ref 0.40–1.20)
GFR: 106.71 mL/min (ref >60.00)
Glucose, Bld: 112 mg/dL — ABNORMAL HIGH (ref 70–99)
Potassium: 3.4 mEq/L — ABNORMAL LOW (ref 3.5–5.1)
Sodium: 140 mEq/L (ref 135–145)

## 2019-05-14 NOTE — Telephone Encounter (Signed)
See mychart.  

## 2019-05-25 ENCOUNTER — Other Ambulatory Visit: Payer: Self-pay | Admitting: Family

## 2019-06-04 ENCOUNTER — Ambulatory Visit: Payer: BC Managed Care – PPO | Admitting: Family

## 2019-06-04 ENCOUNTER — Other Ambulatory Visit: Payer: Self-pay

## 2019-06-04 ENCOUNTER — Encounter: Payer: Self-pay | Admitting: Family

## 2019-06-04 VITALS — BP 151/61 | HR 72 | Temp 96.5°F | Resp 16 | Wt 317.0 lb

## 2019-06-04 DIAGNOSIS — E876 Hypokalemia: Secondary | ICD-10-CM | POA: Diagnosis not present

## 2019-06-04 DIAGNOSIS — I10 Essential (primary) hypertension: Secondary | ICD-10-CM | POA: Diagnosis not present

## 2019-06-04 MED ORDER — METOPROLOL SUCCINATE ER 25 MG PO TB24: 25.0000 mg | ORAL_TABLET | Freq: Every day | ORAL | 3 refills | Status: DC

## 2019-06-04 NOTE — Progress Notes (Signed)
Subjective:    Patient ID: Marissa Bowman, female    DOB: 02-21-1966, 53 y.o.   MRN: [id number]  HPI  Patient is a 53 yr old female who presents today for follow up.  HTN- Last visit bp was elevated and we increased lisinopril from 20mg  to 40mg  once daily. She is also maintained on amlodipine 10mg  once daily. Denies CP/SOB orr swelling.  BP Readings from Last 3 Encounters:  06/04/19 (!) 151/61  05/07/19 (!) 160/70  04/09/19 (!) 144/72     Review of Systems    see HPI  Past Medical History:  Diagnosis Date  . Anemia    on iron  . Asthma    childhood  . Hypertension      Social History   Socioeconomic History  . Marital status: Single    Spouse name: Not on file  . Number of children: Not on file  . Years of education: Not on file  . Highest education level: Not on file  Occupational History  . Not on file  Social Needs  . Financial resource strain: Not on file  . Food insecurity    Worry: Not on file    Inability: Not on file  . Transportation needs    Medical: Not on file    Non-medical: Not on file  Tobacco Use  . Smoking status: Never Smoker  . Smokeless tobacco: Never Used  Substance and Sexual Activity  . Alcohol use: No  . Drug use: No  . Sexual activity: Not on file  Lifestyle  . Physical activity    Days per week: Not on file    Minutes per session: Not on file  . Stress: Not on file  Relationships  . Social Herbalist on phone: Not on file    Gets together: Not on file    Attends religious service: Not on file    Active member of club or organization: Not on file    Attends meetings of clubs or organizations: Not on file    Relationship status: Not on file  . Intimate partner violence    Fear of current or ex partner: Not on file    Emotionally abused: Not on file    Physically abused: Not on file    Forced sexual activity: Not on file  Other Topics Concern  . Not on file  Social History Narrative   1 son- age 59   Unemployed, plans to sub with the [employer]   Associated degree   Lives with mother father/father and son   Has 1 dog lab/spaniel mix   Enjoys reading    Past Surgical History:  Procedure Laterality Date  . COLONOSCOPY    . COLONOSCOPY WITH PROPOFOL N/A 02/03/2017   Procedure: COLONOSCOPY WITH PROPOFOL;  Surgeon: Mauri Pole, MD;  Location: [facility];  Service: Endoscopy;  Laterality: N/A;  . NO PAST SURGERIES      Family History  Problem Relation Age of Onset  . Hypertension Mother   . Hyperlipidemia Mother   . Diabetes Mother   . Lung cancer Father   . Thyroid disease Sister   . Fibromyalgia Sister   . Colon cancer Neg Hx     Allergies  Allergen Reactions  . Latex     rash    Current Outpatient Medications on File Prior to Visit  Medication Sig Dispense Refill  . amLODipine (NORVASC) 10 MG tablet TAKE ONE TABLET BY MOUTH ONE TIME DAILY 30  tablet 5  . ferrous sulfate 325 (65 FE) MG tablet Take 325 mg by mouth 2 (two) times daily with a meal.     . lisinopril (ZESTRIL) 40 MG tablet Take 1 tablet (40 mg total) by mouth daily. 30 tablet 3   No current facility-administered medications on file prior to visit.     BP (!) 151/61 (BP Location: Right Arm, Patient Position: Sitting, Cuff Size: Large)   Pulse 72   Temp (!) 96.5 F (35.8 C) (Temporal)   Resp 16   Wt (!) 317 lb (143.8 kg)   SpO2 99%   BMI 54.41 kg/m    Objective:   Physical Exam Constitutional:      Appearance: She is well-developed.  Neck:     Musculoskeletal: Neck supple.     Thyroid: No thyromegaly.  Cardiovascular:     Rate and Rhythm: Normal rate and regular rhythm.     Heart sounds: Normal heart sounds. No murmur.  Pulmonary:     Effort: Pulmonary effort is normal. No respiratory distress.     Breath sounds: Normal breath sounds. No wheezing.  Skin:    General: Skin is warm and dry.  Neurological:     Mental Status: She is alert and oriented to person, place, and time.   Psychiatric:        Behavior: Behavior normal.        Thought Content: Thought content normal.        Judgment: Judgment normal.           Assessment & Plan:  HTN- bp improving but still above goal. Will add toprol xl once daily to her regimen.   Hypokalemia- repeat bmet.

## 2019-06-04 NOTE — Patient Instructions (Signed)
Please add metoprolol xl once daily.

## 2019-06-28 ENCOUNTER — Encounter: Payer: Self-pay | Admitting: Family

## 2019-06-28 ENCOUNTER — Ambulatory Visit: Payer: BC Managed Care – PPO | Admitting: Family

## 2019-06-28 ENCOUNTER — Other Ambulatory Visit: Payer: Self-pay

## 2019-06-28 ENCOUNTER — Ambulatory Visit (HOSPITAL_BASED_OUTPATIENT_CLINIC_OR_DEPARTMENT_OTHER)
Admission: RE | Admit: 2019-06-28 | Discharge: 2019-06-28 | Disposition: A | Discharge status: Home / Self Care | Payer: BC Managed Care – PPO | Source: Ambulatory Visit | Attending: Family | Admitting: Family

## 2019-06-28 VITALS — BP 124/82 | HR 74 | Temp 97.3°F | Resp 17 | Ht 64.0 in | Wt 316.0 lb

## 2019-06-28 DIAGNOSIS — M79604 Pain in right leg: Secondary | ICD-10-CM | POA: Diagnosis not present

## 2019-06-28 DIAGNOSIS — I10 Essential (primary) hypertension: Secondary | ICD-10-CM | POA: Diagnosis not present

## 2019-06-28 DIAGNOSIS — E876 Hypokalemia: Secondary | ICD-10-CM

## 2019-06-28 LAB — BASIC METABOLIC PANEL
BUN: 8 mg/dL (ref 6–23)
CO2: 30 mEq/L (ref 19–32)
Calcium: 9 mg/dL (ref 8.4–10.5)
Chloride: 101 mEq/L (ref 96–112)
Creatinine, Ser: 0.64 mg/dL (ref 0.40–1.20)
GFR: 116.33 mL/min (ref >60.00)
Glucose, Bld: 95 mg/dL (ref 70–99)
Potassium: 3.6 mEq/L (ref 3.5–5.1)
Sodium: 141 mEq/L (ref 135–145)

## 2019-06-28 NOTE — Patient Instructions (Addendum)
Please complete lab work prior to leaving. Return at 4:15 today for a 4:30 ultrasound appointment.

## 2019-06-28 NOTE — Progress Notes (Signed)
Subjective:    Patient ID: Marissa Bowman, female    DOB: April 14, 1966, 53 y.o.   MRN: [id number]  HPI   Patient is a 53 yr old female who presents today with chief complaint of right sided leg pain. Reports pain is "like a throbbing toothache." Patient reports pain is located from the right lateral knee "all the way down."  Reports that she develops some intermittent numbness on the bottom of her right foot with prolonged sitting. Tried tylenol with brief improvement in her symptoms. Denies associated bowel/bladder incontinence.  Denies back pain. Denies previous history of similar pain.     Review of Systems    see HPI  Past Medical History:  Diagnosis Date  . Anemia    on iron  . Asthma    childhood  . Hypertension      Social History   Socioeconomic History  . Marital status: Single    Spouse name: Not on file  . Number of children: Not on file  . Years of education: Not on file  . Highest education level: Not on file  Occupational History  . Not on file  Social Needs  . Financial resource strain: Not on file  . Food insecurity    Worry: Not on file    Inability: Not on file  . Transportation needs    Medical: Not on file    Non-medical: Not on file  Tobacco Use  . Smoking status: Never Smoker  . Smokeless tobacco: Never Used  Substance and Sexual Activity  . Alcohol use: No  . Drug use: No  . Sexual activity: Not on file  Lifestyle  . Physical activity    Days per week: Not on file    Minutes per session: Not on file  . Stress: Not on file  Relationships  . Social Herbalist on phone: Not on file    Gets together: Not on file    Attends religious service: Not on file    Active member of club or organization: Not on file    Attends meetings of clubs or organizations: Not on file    Relationship status: Not on file  . Intimate partner violence    Fear of current or ex partner: Not on file    Emotionally abused: Not on file    Physically  abused: Not on file    Forced sexual activity: Not on file  Other Topics Concern  . Not on file  Social History Narrative   1 son- age 18   Unemployed, plans to sub with the [employer]   Associated degree   Lives with mother father/father and son   Has 1 dog lab/spaniel mix   Enjoys reading    Past Surgical History:  Procedure Laterality Date  . COLONOSCOPY    . COLONOSCOPY WITH PROPOFOL N/A 02/03/2017   Procedure: COLONOSCOPY WITH PROPOFOL;  Surgeon: Mauri Pole, MD;  Location: [facility];  Service: Endoscopy;  Laterality: N/A;  . NO PAST SURGERIES      Family History  Problem Relation Age of Onset  . Hypertension Mother   . Hyperlipidemia Mother   . Diabetes Mother   . Lung cancer Father   . Thyroid disease Sister   . Fibromyalgia Sister   . Colon cancer Neg Hx     Allergies  Allergen Reactions  . Latex     rash    Current Outpatient Medications on File Prior to Visit  Medication  Sig Dispense Refill  . amLODipine (NORVASC) 10 MG tablet TAKE ONE TABLET BY MOUTH ONE TIME DAILY 30 tablet 5  . ferrous sulfate 325 (65 FE) MG tablet Take 325 mg by mouth 2 (two) times daily with a meal.     . lisinopril (ZESTRIL) 40 MG tablet Take 1 tablet (40 mg total) by mouth daily. 30 tablet 3  . metoprolol succinate (TOPROL-XL) 25 MG 24 hr tablet Take 1 tablet (25 mg total) by mouth daily. 30 tablet 3   No current facility-administered medications on file prior to visit.     BP 124/82 (BP Location: Left Arm, Patient Position: Sitting, Cuff Size: Large)   Pulse 74   Temp (!) 97.3 F (36.3 C) (Temporal)   Resp 17   Ht 5\' 4"  (1.626 m)   Wt (!) 316 lb (143.3 kg)   SpO2 98%   BMI 54.24 kg/m    Objective:   Physical Exam Constitutional:      Appearance: She is well-developed.  Neck:     Musculoskeletal: Neck supple.     Thyroid: No thyromegaly.  Cardiovascular:     Rate and Rhythm: Normal rate and regular rhythm.     Heart sounds: Normal heart sounds. No murmur.   Pulmonary:     Effort: Pulmonary effort is normal. No respiratory distress.     Breath sounds: Normal breath sounds. No wheezing.  Musculoskeletal:     Comments: + RLE swelling > LLE Right calf tenderness to palpation  Skin:    General: Skin is warm and dry.  Neurological:     Mental Status: She is alert and oriented to person, place, and time.  Psychiatric:        Behavior: Behavior normal.        Thought Content: Thought content normal.        Judgment: Judgment normal.           Assessment & Plan:  Hypokalemia- overdue for follow up bmet. Will order today.   Leg pain/swelling- will obtain a LE doppler to exclude DVT.  If negative for DVT consider medrol dose pak for possible lumbar radiculopathy.   HTN- bp stable/improved. Continue current meds.

## 2019-07-01 ENCOUNTER — Telehealth: Payer: Self-pay | Admitting: Family

## 2019-07-01 MED ORDER — METHYLPREDNISOLONE 4 MG PO TBPK: ORAL_TABLET | ORAL | 0 refills | Status: DC

## 2019-07-01 NOTE — Telephone Encounter (Signed)
See result note.  

## 2019-07-02 ENCOUNTER — Ambulatory Visit: Payer: BC Managed Care – PPO | Admitting: Family

## 2019-07-12 ENCOUNTER — Encounter: Payer: Self-pay | Admitting: Family

## 2019-07-15 ENCOUNTER — Encounter: Payer: Self-pay | Admitting: Family

## 2019-07-15 NOTE — Telephone Encounter (Signed)
Called patient, she has virtual visit with Melissa tomorrow am

## 2019-07-16 ENCOUNTER — Ambulatory Visit (INDEPENDENT_AMBULATORY_CARE_PROVIDER_SITE_OTHER): Payer: BC Managed Care – PPO | Admitting: Family

## 2019-07-16 ENCOUNTER — Other Ambulatory Visit: Payer: Self-pay

## 2019-07-16 ENCOUNTER — Other Ambulatory Visit: Payer: Self-pay | Admitting: Registered"

## 2019-07-16 ENCOUNTER — Telehealth: Payer: BC Managed Care – PPO

## 2019-07-16 ENCOUNTER — Encounter: Payer: Self-pay | Admitting: Family

## 2019-07-16 DIAGNOSIS — Z20822 Contact with and (suspected) exposure to covid-19: Secondary | ICD-10-CM

## 2019-07-16 DIAGNOSIS — Z20828 Contact with and (suspected) exposure to other viral communicable diseases: Secondary | ICD-10-CM

## 2019-07-16 NOTE — Progress Notes (Signed)
Virtual Visit via Video Note  I connected with Marissa Bowman on 07/16/19 at 11:00 AM EDT by a video enabled telemedicine application and verified that I am speaking with the correct person using two identifiers.  Location: Patient: home Provider: work   I discussed the limitations of evaluation and management by telemedicine and the availability of in person appointments. The patient expressed understanding and agreed to proceed.  History of Present Illness:  Patient is a 53 yr old female who presents today requesting COVID-19 testing. Reports that she had contact with coworker who tested positive for coronavirus on Friday. 07/12/19. She reports + cough which began yesterday. Denies associated fever, chills, myalgia, sore throat, headache, loss of taste or loss of smell.    Past Medical History:  Diagnosis Date  . Anemia    on iron  . Asthma    childhood  . Hypertension      Social History   Socioeconomic History  . Marital status: Single    Spouse name: Not on file  . Number of children: Not on file  . Years of education: Not on file  . Highest education level: Not on file  Occupational History  . Not on file  Social Needs  . Financial resource strain: Not on file  . Food insecurity    Worry: Not on file    Inability: Not on file  . Transportation needs    Medical: Not on file    Non-medical: Not on file  Tobacco Use  . Smoking status: Never Smoker  . Smokeless tobacco: Never Used  Substance and Sexual Activity  . Alcohol use: No  . Drug use: No  . Sexual activity: Not on file  Lifestyle  . Physical activity    Days per week: Not on file    Minutes per session: Not on file  . Stress: Not on file  Relationships  . Social Herbalist on phone: Not on file    Gets together: Not on file    Attends religious service: Not on file    Active member of club or organization: Not on file    Attends meetings of clubs or organizations: Not on file   Relationship status: Not on file  . Intimate partner violence    Fear of current or ex partner: Not on file    Emotionally abused: Not on file    Physically abused: Not on file    Forced sexual activity: Not on file  Other Topics Concern  . Not on file  Social History Narrative   1 son- age 89   Unemployed, plans to sub with the [employer]   Associated degree   Lives with mother father/father and son   Has 1 dog lab/spaniel mix   Enjoys reading    Past Surgical History:  Procedure Laterality Date  . COLONOSCOPY    . COLONOSCOPY WITH PROPOFOL N/A 02/03/2017   Procedure: COLONOSCOPY WITH PROPOFOL;  Surgeon: Mauri Pole, MD;  Location: [facility];  Service: Endoscopy;  Laterality: N/A;  . NO PAST SURGERIES      Family History  Problem Relation Age of Onset  . Hypertension Mother   . Hyperlipidemia Mother   . Diabetes Mother   . Lung cancer Father   . Thyroid disease Sister   . Fibromyalgia Sister   . Colon cancer Neg Hx     Allergies  Allergen Reactions  . Latex     rash    Current  Outpatient Medications on File Prior to Visit  Medication Sig Dispense Refill  . amLODipine (NORVASC) 10 MG tablet TAKE ONE TABLET BY MOUTH ONE TIME DAILY 30 tablet 5  . ferrous sulfate 325 (65 FE) MG tablet Take 325 mg by mouth 2 (two) times daily with a meal.     . lisinopril (ZESTRIL) 40 MG tablet Take 1 tablet (40 mg total) by mouth daily. 30 tablet 3  . methylPREDNISolone (MEDROL DOSEPAK) 4 MG TBPK tablet Take per package instructions 21 tablet 0  . metoprolol succinate (TOPROL-XL) 25 MG 24 hr tablet Take 1 tablet (25 mg total) by mouth daily. 30 tablet 3   No current facility-administered medications on file prior to visit.     There were no vitals taken for this visit.    Observations/Objective:   Gen: Awake, alert, no acute distress Resp: Breathing is even and non-labored Psych: calm/pleasant demeanor Neuro: Alert and Oriented x 3, + facial symmetry, speech is  clear.   Assessment and Plan:  COVID-19 exposure- pt was given address to go to [facility] testing site today for COVID-19 swab.  She is advised to quarantine pending review of test results. Due to cough, I have advised the patient to quarantine pending my review of her COVID-19 results. She is advised to notify me if her cough worsens.  Follow Up Instructions:    I discussed the assessment and treatment plan with the patient. The patient was provided an opportunity to ask questions and all were answered. The patient agreed with the plan and demonstrated an understanding of the instructions.   The patient was advised to call back or seek an in-person evaluation if the symptoms worsen or if the condition fails to improve as anticipated.  Marissa Pear, NP

## 2019-07-17 LAB — NOVEL CORONAVIRUS, NAA: SARS-CoV-2, NAA: NOT DETECTED

## 2019-07-31 ENCOUNTER — Encounter: Payer: Self-pay | Admitting: Family

## 2019-07-31 ENCOUNTER — Other Ambulatory Visit: Payer: Self-pay

## 2019-07-31 ENCOUNTER — Telehealth: Payer: Self-pay | Admitting: *Deleted

## 2019-07-31 DIAGNOSIS — D509 Iron deficiency anemia, unspecified: Secondary | ICD-10-CM

## 2019-07-31 NOTE — Telephone Encounter (Signed)
Yes please

## 2019-07-31 NOTE — Telephone Encounter (Signed)
Initial IFOB order was placed on 12/27/18. Pt never picked up kit and original order was cancelled. Does pt still need to complete this test?

## 2019-07-31 NOTE — Telephone Encounter (Signed)
Lvm for patient to call the office back or send me a mychart message

## 2019-07-31 NOTE — Telephone Encounter (Signed)
Patient advised Lenna Sciara still will like for her to complete an IFOB. Patient will come in and pick up kit for tes. Test ordered as future.

## 2019-08-23 ENCOUNTER — Other Ambulatory Visit (INDEPENDENT_AMBULATORY_CARE_PROVIDER_SITE_OTHER): Payer: BC Managed Care – PPO

## 2019-08-23 DIAGNOSIS — D509 Iron deficiency anemia, unspecified: Secondary | ICD-10-CM

## 2019-08-23 LAB — FECAL OCCULT BLOOD, IMMUNOCHEMICAL: Fecal Occult Bld: NEGATIVE

## 2019-09-01 ENCOUNTER — Other Ambulatory Visit: Payer: Self-pay | Admitting: Family

## 2019-09-20 ENCOUNTER — Other Ambulatory Visit: Payer: Self-pay | Admitting: Family

## 2019-09-20 ENCOUNTER — Telehealth: Payer: Self-pay | Admitting: Family

## 2019-09-20 NOTE — Telephone Encounter (Signed)
Please schedule pt for follow up.  If she has a blood pressure cuff at home we can do it virtually.

## 2019-09-23 NOTE — Telephone Encounter (Signed)
Patient returned call and states she does not have a BP cuff, patient scheduled for 09/27/2019 with PCP

## 2019-09-23 NOTE — Telephone Encounter (Signed)
Called lvm to call back for an app virtual is okay if pt has cuff.

## 2019-09-27 ENCOUNTER — Other Ambulatory Visit: Payer: Self-pay

## 2019-09-27 ENCOUNTER — Other Ambulatory Visit: Payer: Self-pay | Admitting: Family

## 2019-09-27 ENCOUNTER — Ambulatory Visit (INDEPENDENT_AMBULATORY_CARE_PROVIDER_SITE_OTHER): Payer: BC Managed Care – PPO | Admitting: Internal Medicine

## 2019-09-27 VITALS — BP 130/76 | HR 71

## 2019-09-27 DIAGNOSIS — I10 Essential (primary) hypertension: Secondary | ICD-10-CM | POA: Diagnosis not present

## 2019-09-27 NOTE — Progress Notes (Signed)
Please advise pt OK to remain off of metoprolol.

## 2019-09-27 NOTE — Progress Notes (Signed)
Pt here for Blood pressure check per Melissa.  Pt currently takes: amlodipine and lisinopril  For the metoprolol she only took for 3 months and did not get it refilled. She stated pharmacy did send refill request but they never got a response.  Pt reports compliance with medication.  BP today @ = 130/76 HR = 71  Pt advised per Dr. Larose Kells no change.  We will forward note to PCP to see if she wants to continue the metoprolol.

## 2019-11-21 ENCOUNTER — Other Ambulatory Visit: Payer: Self-pay | Admitting: Family

## 2019-11-30 ENCOUNTER — Other Ambulatory Visit: Payer: Self-pay | Admitting: Family

## 2019-12-10 ENCOUNTER — Telehealth: Payer: Self-pay | Admitting: Family

## 2019-12-10 MED ORDER — METOPROLOL SUCCINATE ER 50 MG PO TB24: 50.0000 mg | ORAL_TABLET | Freq: Every day | ORAL | 3 refills | Status: DC

## 2019-12-10 NOTE — Telephone Encounter (Signed)
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  We can stop amlodipine, but she will need to add Toprol XL 50mg  once daily in its place and we will need to see her in the office in 1 week for bp follow up with me.  [facility]

## 2019-12-10 NOTE — Telephone Encounter (Signed)
Attempted to reach pt by phone. Unable to leave message w/pt's mother. States pt will return in about 1 1/2 hours and to call back at that time.

## 2019-12-10 NOTE — Telephone Encounter (Signed)
Pt in office with her mom and has been notified of below. She will schedule OV before leaving.

## 2019-12-20 ENCOUNTER — Encounter: Payer: Self-pay | Admitting: Family

## 2019-12-20 ENCOUNTER — Other Ambulatory Visit: Payer: Self-pay

## 2019-12-20 ENCOUNTER — Ambulatory Visit (INDEPENDENT_AMBULATORY_CARE_PROVIDER_SITE_OTHER): Payer: BC Managed Care – PPO | Admitting: Family

## 2019-12-20 VITALS — BP 129/74 | HR 58 | Temp 97.4°F | Resp 16 | Wt 331.0 lb

## 2019-12-20 DIAGNOSIS — I10 Essential (primary) hypertension: Secondary | ICD-10-CM | POA: Diagnosis not present

## 2019-12-20 DIAGNOSIS — D509 Iron deficiency anemia, unspecified: Secondary | ICD-10-CM | POA: Diagnosis not present

## 2019-12-20 NOTE — Patient Instructions (Signed)
Please complete lab work prior to leaving.   

## 2019-12-20 NOTE — Addendum Note (Signed)
Addended by: Caffie Pinto on: 12/20/2019 03:04 PM   Modules accepted: Orders

## 2019-12-20 NOTE — Progress Notes (Signed)
Subjective:    Patient ID: Marissa Bowman, female    DOB: 1966/02/13, 54 y.o.   MRN: [id number]  HPI  Patient is a 54 year old female who presents today for follow-up.  Hypertension-She is currently on lisinopril 40 mg and metoprolol.   BP Readings from Last 3 Encounters:  12/20/19 129/74  09/27/19 130/76  06/28/19 124/82   Iron deficiency anemia- maintained on iron.     Review of Systems See HPI  Past Medical History:  Diagnosis Date  . Anemia    on iron  . Asthma    childhood  . Hypertension      Social History   Socioeconomic History  . Marital status: Single    Spouse name: Not on file  . Number of children: Not on file  . Years of education: Not on file  . Highest education level: Not on file  Occupational History  . Not on file  Tobacco Use  . Smoking status: Never Smoker  . Smokeless tobacco: Never Used  Substance and Sexual Activity  . Alcohol use: No  . Drug use: No  . Sexual activity: Not on file  Other Topics Concern  . Not on file  Social History Narrative   1 son- age 58   Unemployed, plans to sub with the [employer]   Associated degree   Lives with mother father/father and son   Has 1 dog lab/spaniel mix   Enjoys reading   Social Determinants of Health   Financial Resource Strain:   . Difficulty of Paying Living Expenses: Not on file  Food Insecurity:   . Worried About Charity fundraiser in the Last Year: Not on file  . Ran Out of Food in the Last Year: Not on file  Transportation Needs:   . Lack of Transportation (Medical): Not on file  . Lack of Transportation (Non-Medical): Not on file  Physical Activity:   . Days of Exercise per Week: Not on file  . Minutes of Exercise per Session: Not on file  Stress:   . Feeling of Stress : Not on file  Social Connections:   . Frequency of Communication with Friends and Family: Not on file  . Frequency of Social Gatherings with Friends and Family: Not on file  . Attends Religious  Services: Not on file  . Active Member of Clubs or Organizations: Not on file  . Attends Archivist Meetings: Not on file  . Marital Status: Not on file  Intimate Partner Violence:   . Fear of Current or Ex-Partner: Not on file  . Emotionally Abused: Not on file  . Physically Abused: Not on file  . Sexually Abused: Not on file    Past Surgical History:  Procedure Laterality Date  . COLONOSCOPY    . COLONOSCOPY WITH PROPOFOL N/A 02/03/2017   Procedure: COLONOSCOPY WITH PROPOFOL;  Surgeon: Mauri Pole, MD;  Location: [facility];  Service: Endoscopy;  Laterality: N/A;  . NO PAST SURGERIES      Family History  Problem Relation Age of Onset  . Hypertension Mother   . Hyperlipidemia Mother   . Diabetes Mother   . Lung cancer Father   . Thyroid disease Sister   . Fibromyalgia Sister   . Colon cancer Neg Hx     Allergies  Allergen Reactions  . Latex     rash    Current Outpatient Medications on File Prior to Visit  Medication Sig Dispense Refill  . ferrous sulfate 325 (  65 FE) MG tablet Take 325 mg by mouth 2 (two) times daily with a meal.     . lisinopril (ZESTRIL) 40 MG tablet TAKE ONE TABLET BY MOUTH ONE TIME DAILY 30 tablet 3  . metoprolol succinate (TOPROL-XL) 50 MG 24 hr tablet Take 1 tablet (50 mg total) by mouth daily. Take with or immediately following a meal. 30 tablet 3   No current facility-administered medications on file prior to visit.    BP 129/74 (BP Location: Right Arm, Patient Position: Sitting, Cuff Size: Large)   Pulse (!) 58   Temp (!) 97.4 F (36.3 C) (Temporal)   Resp 16   Wt (!) 331 lb (150.1 kg)   SpO2 98%   BMI 56.82 kg/m       Objective:   Physical Exam Constitutional:      Appearance: She is well-developed.  Cardiovascular:     Rate and Rhythm: Normal rate and regular rhythm.     Heart sounds: Normal heart sounds. No murmur.  Pulmonary:     Effort: Pulmonary effort is normal. No respiratory distress.     Breath  sounds: Normal breath sounds. No wheezing.  Psychiatric:        Behavior: Behavior normal.        Thought Content: Thought content normal.        Judgment: Judgment normal.           Assessment & Plan:  HTN- bp stable on current regimen, continue same. Obtain follow up bmet.  Iron deficiency anemia. Obtain follow up cbc, iron studies.  Continue PO iron.   This visit occurred during the SARS-CoV-2 public health emergency.  Safety protocols were in place, including screening questions prior to the visit, additional usage of staff PPE, and extensive cleaning of exam room while observing appropriate contact time as indicated for disinfecting solutions.

## 2019-12-21 LAB — CBC WITH DIFFERENTIAL/PLATELET
Absolute Monocytes: 520 cells/uL (ref 200–950)
Basophils Absolute: 32 cells/uL (ref 0–200)
Basophils Relative: 0.4 %
Eosinophils Absolute: 264 cells/uL (ref 15–500)
Eosinophils Relative: 3.3 %
HCT: 29.9 % — ABNORMAL LOW (ref 35.0–45.0)
Hemoglobin: 9.8 g/dL — ABNORMAL LOW (ref 11.7–15.5)
Lymphs Abs: 1288 cells/uL (ref 850–3900)
MCH: 26.1 pg — ABNORMAL LOW (ref 27.0–33.0)
MCHC: 32.8 g/dL (ref 32.0–36.0)
MCV: 79.5 fL — ABNORMAL LOW (ref 80.0–100.0)
MPV: 10.7 fL (ref 7.5–12.5)
Monocytes Relative: 6.5 %
Neutro Abs: 5896 cells/uL (ref 1500–7800)
Neutrophils Relative %: 73.7 %
Platelets: 291 10*3/uL (ref 140–400)
RBC: 3.76 10*6/uL — ABNORMAL LOW (ref 3.80–5.10)
RDW: 14.4 % (ref 11.0–15.0)
Total Lymphocyte: 16.1 %
WBC: 8 10*3/uL (ref 3.8–10.8)

## 2019-12-21 LAB — BASIC METABOLIC PANEL
BUN: 11 mg/dL (ref 7–25)
CO2: 27 mmol/L (ref 20–32)
Calcium: 8.7 mg/dL (ref 8.6–10.4)
Chloride: 104 mmol/L (ref 98–110)
Creat: 0.64 mg/dL (ref 0.50–1.05)
Glucose, Bld: 83 mg/dL (ref 65–99)
Potassium: 3.9 mmol/L (ref 3.5–5.3)
Sodium: 142 mmol/L (ref 135–146)

## 2019-12-21 LAB — IRON: Iron: 29 ug/dL — ABNORMAL LOW (ref 45–160)

## 2019-12-21 LAB — FERRITIN: Ferritin: 205 ng/mL (ref 16–232)

## 2019-12-23 ENCOUNTER — Telehealth: Payer: Self-pay | Admitting: Family

## 2019-12-23 DIAGNOSIS — D649 Anemia, unspecified: Secondary | ICD-10-CM

## 2019-12-23 NOTE — Telephone Encounter (Signed)
lvm for patient to call back for labs

## 2019-12-23 NOTE — Telephone Encounter (Signed)
Please advise pt that she is still anemic.  Has she been taking iron regularly?  I would like her to make sure she is taking every day and repeat lab work as ordered in 3 months.

## 2019-12-24 NOTE — Telephone Encounter (Signed)
Per patient she is taking iron daily as directed. She has follow up 01-28-2020 will set up labs then.

## 2020-01-28 ENCOUNTER — Encounter (HOSPITAL_BASED_OUTPATIENT_CLINIC_OR_DEPARTMENT_OTHER): Payer: Self-pay

## 2020-01-28 ENCOUNTER — Ambulatory Visit (INDEPENDENT_AMBULATORY_CARE_PROVIDER_SITE_OTHER): Payer: BC Managed Care – PPO | Admitting: Family

## 2020-01-28 ENCOUNTER — Encounter: Payer: Self-pay | Admitting: Family

## 2020-01-28 ENCOUNTER — Emergency Department (HOSPITAL_BASED_OUTPATIENT_CLINIC_OR_DEPARTMENT_OTHER): Payer: BC Managed Care – PPO

## 2020-01-28 ENCOUNTER — Emergency Department (HOSPITAL_BASED_OUTPATIENT_CLINIC_OR_DEPARTMENT_OTHER)
Admission: EM | Admit: 2020-01-28 | Discharge: 2020-01-28 | Disposition: A | Discharge status: Home / Self Care | Payer: BC Managed Care – PPO | Attending: Emergency Medicine | Admitting: Emergency Medicine

## 2020-01-28 ENCOUNTER — Other Ambulatory Visit: Payer: Self-pay

## 2020-01-28 VITALS — BP 166/53 | HR 57 | Temp 97.7°F | Resp 16 | Ht 64.0 in | Wt 321.0 lb

## 2020-01-28 DIAGNOSIS — Z79899 Other long term (current) drug therapy: Secondary | ICD-10-CM | POA: Insufficient documentation

## 2020-01-28 DIAGNOSIS — Z0000 Encounter for general adult medical examination without abnormal findings: Secondary | ICD-10-CM

## 2020-01-28 DIAGNOSIS — Z91040 Latex allergy status: Secondary | ICD-10-CM | POA: Insufficient documentation

## 2020-01-28 DIAGNOSIS — R42 Dizziness and giddiness: Secondary | ICD-10-CM

## 2020-01-28 DIAGNOSIS — R55 Syncope and collapse: Secondary | ICD-10-CM | POA: Diagnosis not present

## 2020-01-28 DIAGNOSIS — I10 Essential (primary) hypertension: Secondary | ICD-10-CM | POA: Diagnosis not present

## 2020-01-28 DIAGNOSIS — R110 Nausea: Secondary | ICD-10-CM | POA: Diagnosis not present

## 2020-01-28 DIAGNOSIS — E348 Other specified endocrine disorders: Secondary | ICD-10-CM

## 2020-01-28 DIAGNOSIS — E785 Hyperlipidemia, unspecified: Secondary | ICD-10-CM

## 2020-01-28 LAB — COMPREHENSIVE METABOLIC PANEL
ALT: 16 U/L (ref 0–44)
AST: 17 U/L (ref 15–41)
Albumin: 3.7 g/dL (ref 3.5–5.0)
Alkaline Phosphatase: 61 U/L (ref 38–126)
Anion gap: 9 (ref 5–15)
BUN: 9 mg/dL (ref 6–20)
CO2: 28 mmol/L (ref 22–32)
Calcium: 8.4 mg/dL — ABNORMAL LOW (ref 8.9–10.3)
Chloride: 102 mmol/L (ref 98–111)
Creatinine, Ser: 0.63 mg/dL (ref 0.44–1.00)
GFR calc Af Amer: >60 mL/min (ref >60)
GFR calc non Af Amer: >60 mL/min (ref >60)
Glucose, Bld: 104 mg/dL — ABNORMAL HIGH (ref 70–99)
Potassium: 3.1 mmol/L — ABNORMAL LOW (ref 3.5–5.1)
Sodium: 139 mmol/L (ref 135–145)
Total Bilirubin: 0.3 mg/dL (ref 0.3–1.2)
Total Protein: 7.5 g/dL (ref 6.5–8.1)

## 2020-01-28 LAB — URINALYSIS, ROUTINE W REFLEX MICROSCOPIC
Bilirubin Urine: NEGATIVE
Glucose, UA: NEGATIVE mg/dL
Ketones, ur: NEGATIVE mg/dL
Leukocytes,Ua: NEGATIVE
Nitrite: NEGATIVE
Protein, ur: NEGATIVE mg/dL
Specific Gravity, Urine: <1.005 — ABNORMAL LOW (ref 1.005–1.030)
pH: 7 (ref 5.0–8.0)

## 2020-01-28 LAB — CBC WITH DIFFERENTIAL/PLATELET
Abs Immature Granulocytes: 0.03 10*3/uL (ref 0.00–0.07)
Basophils Absolute: 0 10*3/uL (ref 0.0–0.1)
Basophils Relative: 0 %
Eosinophils Absolute: 0.2 10*3/uL (ref 0.0–0.5)
Eosinophils Relative: 3 %
HCT: 33.6 % — ABNORMAL LOW (ref 36.0–46.0)
Hemoglobin: 10.6 g/dL — ABNORMAL LOW (ref 12.0–15.0)
Immature Granulocytes: 0 %
Lymphocytes Relative: 16 %
Lymphs Abs: 1.3 10*3/uL (ref 0.7–4.0)
MCH: 25.9 pg — ABNORMAL LOW (ref 26.0–34.0)
MCHC: 31.5 g/dL (ref 30.0–36.0)
MCV: 82 fL (ref 80.0–100.0)
Monocytes Absolute: 0.5 10*3/uL (ref 0.1–1.0)
Monocytes Relative: 6 %
Neutro Abs: 6 10*3/uL (ref 1.7–7.7)
Neutrophils Relative %: 75 %
Platelets: 303 10*3/uL (ref 150–400)
RBC: 4.1 MIL/uL (ref 3.87–5.11)
RDW: 13.5 % (ref 11.5–15.5)
WBC: 7.9 10*3/uL (ref 4.0–10.5)
nRBC: 0 % (ref 0.0–0.2)

## 2020-01-28 LAB — URINALYSIS, MICROSCOPIC (REFLEX)

## 2020-01-28 LAB — CBG MONITORING, ED: Glucose-Capillary: 104 mg/dL — ABNORMAL HIGH (ref 70–99)

## 2020-01-28 LAB — TROPONIN I (HIGH SENSITIVITY): Troponin I (High Sensitivity): 4 ng/L (ref <18)

## 2020-01-28 MED ORDER — MECLIZINE HCL 25 MG PO TABS
25.0000 mg | ORAL_TABLET | Freq: Once | ORAL | Status: AC
  Administered 2020-01-28: 16:00:00 25 mg via ORAL
  Filled 2020-01-28: qty 1

## 2020-01-28 MED ORDER — MECLIZINE HCL 25 MG PO TABS: 25.0000 mg | ORAL_TABLET | Freq: Three times a day (TID) | ORAL | 0 refills | Status: DC | PRN

## 2020-01-28 MED ORDER — LACTATED RINGERS IV BOLUS
1000.0000 mL | Freq: Once | INTRAVENOUS | Status: AC
  Administered 2020-01-28: 17:00:00 1000 mL via INTRAVENOUS

## 2020-01-28 MED ORDER — IOHEXOL 350 MG/ML SOLN
100.0000 mL | Freq: Once | INTRAVENOUS | Status: AC | PRN
  Administered 2020-01-28: 16:00:00 100 mL via INTRAVENOUS

## 2020-01-28 MED ORDER — ONDANSETRON HCL 4 MG/2ML IJ SOLN
4.0000 mg | Freq: Once | INTRAMUSCULAR | Status: AC
  Administered 2020-01-28: 4 mg via INTRAVENOUS
  Filled 2020-01-28: qty 2

## 2020-01-28 NOTE — ED Triage Notes (Addendum)
Pt was in PCP office for a physical/well visit just PTA -she was seated and sudden onset of dizziness and n/v-to triage in w/c actively vomiting-denies pain-states dizziness better after vomiting-denies any recent illness/fever

## 2020-01-28 NOTE — ED Notes (Signed)
Per pt, sudden onset dizziness while sitting on exam table in PCP office. Dizziness described as constant then resolved after 10 minutes. Pt states the dizziness returned when transferring from w/c to bed. Pt having nausea and has vomited x 1.

## 2020-01-28 NOTE — Progress Notes (Signed)
Subjective:    Patient ID: Marissa Bowman, female    DOB: November 07, 1965, 54 y.o.   MRN: [id number]  HPI  Patient presents today for complete physical.  Immunizations: candidate for shingrix, tdap up to  (completed pfizer) Diet: She has been eating less, trying to lose weight Wt Readings from Last 3 Encounters:  01/28/20 (!) 321 lb (145.6 kg)  12/20/19 (!) 331 lb (150.1 kg)  06/28/19 (!) 316 lb (143.3 kg)  Exercise:  walking Colonoscopy: 2018 Dexa: never Pap Smear: 12/18 Mammogram: 03/12/19  Vision: last eye exam last year  Dental:  Up to date  Lab Results  Component Value Date   CHOL 201 (H) 09/20/2017   HDL 63.20 09/20/2017   LDLCALC 121 (H) 09/20/2017   TRIG 84.0 09/20/2017   CHOLHDL 3 09/20/2017     Review of Systems  Constitutional: Negative for unexpected weight change.  HENT: Negative for hearing loss and rhinorrhea.   Eyes: Negative for visual disturbance.  Respiratory: Negative for cough and shortness of breath.   Cardiovascular: Negative for chest pain.  Gastrointestinal: Positive for constipation (due to iron only). Negative for diarrhea, nausea and vomiting.  Genitourinary: Negative for dysuria, frequency and hematuria.  Musculoskeletal: Positive for arthralgias (occasional hip pain).  Skin: Negative for rash.  Neurological: Negative for headaches.  Hematological: Negative for adenopathy.  Psychiatric/Behavioral:       Denies depression/anxiety   Past Medical History:  Diagnosis Date  . Anemia    on iron  . Asthma    childhood  . Hypertension      Social History   Socioeconomic History  . Marital status: Single    Spouse name: Not on file  . Number of children: Not on file  . Years of education: Not on file  . Highest education level: Not on file  Occupational History  . Not on file  Tobacco Use  . Smoking status: Never Smoker  . Smokeless tobacco: Never Used  Substance and Sexual Activity  . Alcohol use: No  . Drug use: No  . Sexual  activity: Not on file  Other Topics Concern  . Not on file  Social History Narrative   1 son- age 28   Unemployed, plans to sub with the [employer]   Associated degree   Lives with mother father/father and son   Has 1 dog lab/spaniel mix   Enjoys reading   Social Determinants of Health   Financial Resource Strain:   . Difficulty of Paying Living Expenses:   Food Insecurity:   . Worried About Charity fundraiser in the Last Year:   . Arboriculturist in the Last Year:   Transportation Needs:   . Film/video editor (Medical):   Marland Kitchen Lack of Transportation (Non-Medical):   Physical Activity:   . Days of Exercise per Week:   . Minutes of Exercise per Session:   Stress:   . Feeling of Stress :   Social Connections:   . Frequency of Communication with Friends and Family:   . Frequency of Social Gatherings with Friends and Family:   . Attends Religious Services:   . Active Member of Clubs or Organizations:   . Attends Archivist Meetings:   Marland Kitchen Marital Status:   Intimate Partner Violence:   . Fear of Current or Ex-Partner:   . Emotionally Abused:   Marland Kitchen Physically Abused:   . Sexually Abused:     Past Surgical History:  Procedure Laterality Date  .  COLONOSCOPY    . COLONOSCOPY WITH PROPOFOL N/A 02/03/2017   Procedure: COLONOSCOPY WITH PROPOFOL;  Surgeon: Mauri Pole, MD;  Location: [facility];  Service: Endoscopy;  Laterality: N/A;  . NO PAST SURGERIES      Family History  Problem Relation Age of Onset  . Hypertension Mother   . Hyperlipidemia Mother   . Diabetes Mother   . Lung cancer Father   . Thyroid disease Sister   . Fibromyalgia Sister   . Colon cancer Neg Hx     Allergies  Allergen Reactions  . Latex     rash    Current Outpatient Medications on File Prior to Visit  Medication Sig Dispense Refill  . ferrous sulfate 325 (65 FE) MG tablet Take 325 mg by mouth 2 (two) times daily with a meal.     . lisinopril (ZESTRIL) 40 MG tablet TAKE  ONE TABLET BY MOUTH ONE TIME DAILY 30 tablet 3  . metoprolol succinate (TOPROL-XL) 50 MG 24 hr tablet Take 1 tablet (50 mg total) by mouth daily. Take with or immediately following a meal. 30 tablet 3   No current facility-administered medications on file prior to visit.    BP (!) 166/53 (BP Location: Right Arm, Patient Position: Sitting, Cuff Size: Large)   Pulse (!) 57   Temp 97.7 F (36.5 C) (Temporal)   Resp 16   Ht 5\' 4"  (1.626 m)   Wt (!) 321 lb (145.6 kg)   SpO2 99%   BMI 55.10 kg/m       Objective:   Physical Exam  Physical Exam  Constitutional: She is oriented to person, place, and time. She appears well-developed/morbidly obese No distress.  HENT:  Head: Normocephalic and atraumatic.  Right Ear: Tympanic membrane and ear canal normal.  Left Ear: Tympanic membrane and ear canal normal.  Mouth/Throat: Oropharynx is clear and moist.  Eyes: Pupils are equal, round, and reactive to light. No scleral icterus.  Neck: Normal range of motion. No thyromegaly present.  Cardiovascular: Normal rate and regular rhythm.   No murmur heard. Pulmonary/Chest: Effort normal and breath sounds normal. No respiratory distress. He has no wheezes. She has no rales. She exhibits no tenderness.  Abdominal: Soft. Bowel sounds are normal. She exhibits no distension and no mass. There is no tenderness. There is no rebound and no guarding.  Musculoskeletal: She exhibits no edema.  Lymphadenopathy:    She has no cervical adenopathy.  Neurological: She is alert and oriented to person, place, and time. She has normal patellar reflexes. She exhibits normal muscle tone. Coordination normal.  Skin: Skin is warm and dry.  Psychiatric: She has a normal mood and affect. Her behavior is normal. Judgment and thought content normal.  Breasts: Examined lying Right: Without masses, retractions, discharge or axillary adenopathy.  Left: Without masses, retractions, discharge or axillary adenopathy.    Inguinal/mons: Normal without inguinal adenopathy  External genitalia: some inflammed folliculitis noted on upper vulvar area          Assessment & Plan:    Preventative care- colo, mammo up to date.  Due for shingrix but was not given today- see below. Discussed importance of healthy diet, exercise, weight loss.   Folliculitis- will rx with keflex.    Syncope- after pt was laid back on the table for examination and sat back up she became extremely dizzy and started to fall laterally off the table.  I was able to grab the edge of her gown and prevent her  from completely falling off the table but she was unresponsive for a few seconds.  She then became responsive and began vomiting.  Blood pressure was rechecked at this time and SBP was 197.  Pt remained weak and dizzy. I contacted the EDP at the [facility] and gave report. Pt was brought downstairs by wheelchair for further evaluation by CMA.  This visit occurred during the SARS-CoV-2 public health emergency.  Safety protocols were in place, including screening questions prior to the visit, additional usage of staff PPE, and extensive cleaning of exam room while observing appropriate contact time as indicated for disinfecting solutions.          Assessment & Plan:

## 2020-01-28 NOTE — ED Notes (Signed)
PT ambulated to bathroom with mostly steady gait and no assistance. PT was not able to obtain urine sample. On the way back from restroom pt reported she was still dizzy ambulating.

## 2020-01-28 NOTE — Discharge Instructions (Addendum)
Please schedule follow-up appoint with your primary doctor to discuss the symptoms you are experiencing today as well as discuss additional outpatient testing.  Take meclizine as needed for nausea and dizziness.  If you feel that your dizziness is significantly worsening, you develop any numbness, weakness, vision change, speech change, chest pain or difficulty in breathing, return to ER for reassessment.

## 2020-01-29 ENCOUNTER — Telehealth: Payer: Self-pay | Admitting: Family

## 2020-01-29 DIAGNOSIS — E041 Nontoxic single thyroid nodule: Secondary | ICD-10-CM

## 2020-01-29 NOTE — Telephone Encounter (Signed)
Left detail message for patient to call me back.

## 2020-01-29 NOTE — ED Provider Notes (Signed)
[facility] EMERGENCY DEPARTMENT Provider Note   CSN: [id number] Arrival date & time: 01/28/20  1439     History Chief Complaint  Patient presents with  . Dizziness    Marissa Bowman is a 54 y.o. female.  Presents to ER after having episode of dizziness, possible LOC.  Possibly some worsening sensation but more lightheadedness.  Unsure if she completely passed out.  Felt nauseous, may have had episode of small vomit.  Episode occurred while in the doctor's office for routine visit.  Ports that she was feeling fine up until this episode.  By the time patient was taken to ER, she reports her symptoms have completely resolved.  No associated chest pain, difficulty breathing, no associated numbness, weakness, vision changes.  HPI     Past Medical History:  Diagnosis Date  . Anemia    on iron  . Asthma    childhood  . Hypertension     Patient Active Problem List   Diagnosis Date Noted  . Polyp of sigmoid colon   . Polyp of transverse colon   . Special screening for malignant neoplasms, colon   . Anemia, iron deficiency 08/14/2014  . Preventative health care 08/08/2014  . HTN (hypertension) 08/06/2013  . Morbid obesity with BMI of 50.0-59.9, adult ([place]) 08/06/2013    Past Surgical History:  Procedure Laterality Date  . COLONOSCOPY    . COLONOSCOPY WITH PROPOFOL N/A 02/03/2017   Procedure: COLONOSCOPY WITH PROPOFOL;  Surgeon: Mauri Pole, MD;  Location: [facility];  Service: Endoscopy;  Laterality: N/A;  . NO PAST SURGERIES       OB History   No obstetric history on file.     Family History  Problem Relation Age of Onset  . Hypertension Mother   . Hyperlipidemia Mother   . Diabetes Mother   . Lung cancer Father   . Thyroid disease Sister   . Fibromyalgia Sister   . Colon cancer Neg Hx     Social History   Tobacco Use  . Smoking status: Never Smoker  . Smokeless tobacco: Never Used  Substance Use Topics  . Alcohol use: No  . Drug use:  No    Home Medications Prior to Admission medications   Medication Sig Start Date End Date Taking? Authorizing Provider  ferrous sulfate 325 (65 FE) MG tablet Take 325 mg by mouth 2 (two) times daily with a meal.     [provider]  lisinopril (ZESTRIL) 40 MG tablet TAKE ONE TABLET BY MOUTH ONE TIME DAILY 12/02/19   [name], NP  meclizine (ANTIVERT) 25 MG tablet Take 1 tablet (25 mg total) by mouth 3 (three) times daily as needed for dizziness. 01/28/20   Lucrezia Starch, MD  metoprolol succinate (TOPROL-XL) 50 MG 24 hr tablet Take 1 tablet (50 mg total) by mouth daily. Take with or immediately following a meal. 12/10/19   [name], NP    Allergies    Latex  Review of Systems   Review of Systems  Constitutional: Negative for chills and fever.  HENT: Negative for ear pain and sore throat.   Eyes: Negative for pain and visual disturbance.  Respiratory: Negative for cough and shortness of breath.   Cardiovascular: Negative for chest pain and palpitations.  Gastrointestinal: Positive for nausea. Negative for abdominal pain and vomiting.  Genitourinary: Negative for dysuria and hematuria.  Musculoskeletal: Negative for arthralgias and back pain.  Skin: Negative for color change and rash.  Neurological: Positive for  dizziness and light-headedness. Negative for seizures and syncope.  All other systems reviewed and are negative.   Physical Exam Updated Vital Signs BP (!) 127/112   Pulse (!) 56   Temp 98.4 F (36.9 C) (Oral)   Resp 20   LMP 01/27/2017   SpO2 98%   Physical Exam Vitals and nursing note reviewed.  Constitutional:      General: She is not in acute distress.    Appearance: She is well-developed.  HENT:     Head: Normocephalic and atraumatic.  Eyes:     Conjunctiva/sclera: Conjunctivae normal.  Cardiovascular:     Rate and Rhythm: Normal rate and regular rhythm.     Heart sounds: No murmur.  Pulmonary:     Effort: Pulmonary  effort is normal. No respiratory distress.     Breath sounds: Normal breath sounds.  Abdominal:     Palpations: Abdomen is soft.     Tenderness: There is no abdominal tenderness.  Musculoskeletal:     Cervical back: Neck supple.  Skin:    General: Skin is warm and dry.  Neurological:     Mental Status: She is alert.     Comments: AAOx3 CN 2-12 intact, speech clear visual fields intact 5/5 strength in b/l UE and LE Sensation to light touch intact in b/l UE and LE Normal FNF Normal gait     ED Results / Procedures / Treatments   Labs (all labs ordered are listed, but only abnormal results are displayed) Labs Reviewed  CBC WITH DIFFERENTIAL/PLATELET - Abnormal; Notable for the following components:      Result Value   Hemoglobin 10.6 (*)    HCT 33.6 (*)    MCH 25.9 (*)    All other components within normal limits  COMPREHENSIVE METABOLIC PANEL - Abnormal; Notable for the following components:   Potassium 3.1 (*)    Glucose, Bld 104 (*)    Calcium 8.4 (*)    All other components within normal limits  URINALYSIS, ROUTINE W REFLEX MICROSCOPIC - Abnormal; Notable for the following components:   Color, Urine STRAW (*)    Specific Gravity, Urine <1.005 (*)    Hgb urine dipstick SMALL (*)    All other components within normal limits  URINALYSIS, MICROSCOPIC (REFLEX) - Abnormal; Notable for the following components:   Bacteria, UA FEW (*)    All other components within normal limits  CBG MONITORING, ED - Abnormal; Notable for the following components:   Glucose-Capillary 104 (*)    All other components within normal limits  CBG MONITORING, ED  TROPONIN I (HIGH SENSITIVITY)    EKG EKG Interpretation  Date/Time:  Tuesday January 28 2020 14:55:46 EDT Ventricular Rate:  62 PR Interval:  180 QRS Duration: 96 QT Interval:  412 QTC Calculation: 418 R Axis:   4 Text Interpretation: Normal sinus rhythm Cannot rule out Anterior infarct , age undetermined Abnormal ECG Confirmed by  Madalyn Rob [phone]) on 01/28/2020 3:14:31 PM   Radiology CT Angio Head W or Wo Contrast  Result Date: 01/28/2020 CLINICAL DATA:  Sudden onset of vertigo. Sudden onset dizziness while sitting on exam table at PCP office. Symptoms resolved after 10 minutes. Dizziness is now recurring based on head positioning. EXAM: CT ANGIOGRAPHY HEAD AND NECK TECHNIQUE: Multidetector CT imaging of the head and neck was performed using the standard protocol during bolus administration of intravenous contrast. Multiplanar CT image reconstructions and MIPs were obtained to evaluate the vascular anatomy. Carotid stenosis measurements (when applicable) are obtained  utilizing NASCET criteria, using the distal internal carotid diameter as the denominator. CONTRAST:  165mL OMNIPAQUE IOHEXOL 350 MG/ML SOLN COMPARISON:  None. FINDINGS: CT HEAD FINDINGS Brain: Mild white matter changes are noted bilaterally. Mild generalized atrophy is present. No acute infarct, hemorrhage, or mass lesion is present. Cavum septum pellucidum is noted. Ventricles are otherwise unremarkable. No significant extraaxial fluid collection is present. The brainstem and cerebellum are within normal limits. Vascular: No hyperdense vessel or unexpected calcification. Skull: Calvarium is intact. No focal lytic or blastic lesions are present. No significant extracranial soft tissue lesion is present. Sinuses: The paranasal sinuses and mastoid air cells are clear. Orbits: The globes and orbits are within normal limits. Review of the MIP images confirms the above findings CTA NECK FINDINGS Aortic arch: Common origin of the left common carotid artery and innominate artery is noted. Aorta is unremarkable. Right carotid system: The right common carotid artery is within normal limits. Atherosclerotic calcifications are present at the proximal right ICA without a significant stenosis relative to the more distal vessel. Cervical right ICA is otherwise normal. Left carotid  system: The left common carotid artery is within normal limits. Noncalcified plaque is present in the proximal left ICA without significant stenosis relative to the more distal vessel. Cervical left ICA is otherwise normal. Vertebral arteries: The vertebral arteries both originate from the subclavian arteries. The left vertebral artery is dominant. No significant stenosis is present in either vertebral artery in the neck. Skeleton: Straightening of the normal cervical lordosis is noted. Mild endplate changes are present. No focal lytic or blastic lesions are present. Lucency is present about the roots of the residual left mandibular molar. Large dental caries is present in the left maxilla. Other neck: Thyroid goiter extends into the upper mediastinum. Dominant nodule in the left lobe measures 3.2 x 2.8 cm. Trachea is intact. No significant adenopathy is present. Salivary glands are normal. Upper chest: Lung apices are clear. Review of the MIP images confirms the above findings CTA HEAD FINDINGS Anterior circulation: Atherosclerotic calcifications are present within the cavernous internal carotid arteries bilaterally without significant stenosis. ICA termini are within normal limits bilaterally. The A1 and M1 segments are normal. MCA bifurcations are intact. ACA and MCA branch vessels are within normal limits. Posterior circulation: The left vertebral artery is the dominant vessel. PICA origins are visualized and normal. Vertebrobasilar junction is normal. The basilar artery is normal. Both posterior cerebral arteries originate from basilar tip. PCA branch vessels are within normal limits. Venous sinuses: The dural sinuses are patent. Straight sinus and deep cerebral veins are intact. Cortical veins are within normal limits. Anatomic variants: None Review of the MIP images confirms the above findings IMPRESSION: 1. Mild atherosclerotic changes at the proximal internal carotid arteries bilaterally without significant  stenosis relative to the more distal vessel. Noncalcified plaque is present on the left. 2. Atherosclerotic changes within the cavernous internal carotid arteries bilaterally without significant stenosis. 3. No other significant proximal stenosis, aneurysm, or branch vessel occlusion within the Circle of Willis. 4. Thyroid goiter with a dominant nodule in the left lobe measuring 3.2 x 2.8 cm. Recommend thyroid ultrasound (ref: J Am Coll Radiol. 2015 Feb;12(2): 143-50). Electronically Signed   By: San Morelle M.D.   On: 01/28/2020 17:08   DG Chest 2 View  Result Date: 01/28/2020 CLINICAL DATA:  Chest pain, dizziness EXAM: CHEST - 2 VIEW COMPARISON:  None. FINDINGS: Dense bibasilar areas of opacity with indistinct vascularity and cardiomegaly. No pneumothorax or visible effusion.  No acute osseous or soft tissue abnormality. IMPRESSION: Dense areas of opacity towards the lung bases most notably in the retrocardiac and right infrahilar space may reflect developing interstitial and alveolar edema given cardiomegaly though underlying infection is not fully excluded. Electronically Signed   By: Lovena Le M.D.   On: 01/28/2020 17:22   CT Angio Neck W and/or Wo Contrast  Result Date: 01/28/2020 CLINICAL DATA:  Sudden onset of vertigo. Sudden onset dizziness while sitting on exam table at PCP office. Symptoms resolved after 10 minutes. Dizziness is now recurring based on head positioning. EXAM: CT ANGIOGRAPHY HEAD AND NECK TECHNIQUE: Multidetector CT imaging of the head and neck was performed using the standard protocol during bolus administration of intravenous contrast. Multiplanar CT image reconstructions and MIPs were obtained to evaluate the vascular anatomy. Carotid stenosis measurements (when applicable) are obtained utilizing NASCET criteria, using the distal internal carotid diameter as the denominator. CONTRAST:  142mL OMNIPAQUE IOHEXOL 350 MG/ML SOLN COMPARISON:  None. FINDINGS: CT HEAD FINDINGS  Brain: Mild white matter changes are noted bilaterally. Mild generalized atrophy is present. No acute infarct, hemorrhage, or mass lesion is present. Cavum septum pellucidum is noted. Ventricles are otherwise unremarkable. No significant extraaxial fluid collection is present. The brainstem and cerebellum are within normal limits. Vascular: No hyperdense vessel or unexpected calcification. Skull: Calvarium is intact. No focal lytic or blastic lesions are present. No significant extracranial soft tissue lesion is present. Sinuses: The paranasal sinuses and mastoid air cells are clear. Orbits: The globes and orbits are within normal limits. Review of the MIP images confirms the above findings CTA NECK FINDINGS Aortic arch: Common origin of the left common carotid artery and innominate artery is noted. Aorta is unremarkable. Right carotid system: The right common carotid artery is within normal limits. Atherosclerotic calcifications are present at the proximal right ICA without a significant stenosis relative to the more distal vessel. Cervical right ICA is otherwise normal. Left carotid system: The left common carotid artery is within normal limits. Noncalcified plaque is present in the proximal left ICA without significant stenosis relative to the more distal vessel. Cervical left ICA is otherwise normal. Vertebral arteries: The vertebral arteries both originate from the subclavian arteries. The left vertebral artery is dominant. No significant stenosis is present in either vertebral artery in the neck. Skeleton: Straightening of the normal cervical lordosis is noted. Mild endplate changes are present. No focal lytic or blastic lesions are present. Lucency is present about the roots of the residual left mandibular molar. Large dental caries is present in the left maxilla. Other neck: Thyroid goiter extends into the upper mediastinum. Dominant nodule in the left lobe measures 3.2 x 2.8 cm. Trachea is intact. No  significant adenopathy is present. Salivary glands are normal. Upper chest: Lung apices are clear. Review of the MIP images confirms the above findings CTA HEAD FINDINGS Anterior circulation: Atherosclerotic calcifications are present within the cavernous internal carotid arteries bilaterally without significant stenosis. ICA termini are within normal limits bilaterally. The A1 and M1 segments are normal. MCA bifurcations are intact. ACA and MCA branch vessels are within normal limits. Posterior circulation: The left vertebral artery is the dominant vessel. PICA origins are visualized and normal. Vertebrobasilar junction is normal. The basilar artery is normal. Both posterior cerebral arteries originate from basilar tip. PCA branch vessels are within normal limits. Venous sinuses: The dural sinuses are patent. Straight sinus and deep cerebral veins are intact. Cortical veins are within normal limits. Anatomic variants: None Review  of the MIP images confirms the above findings IMPRESSION: 1. Mild atherosclerotic changes at the proximal internal carotid arteries bilaterally without significant stenosis relative to the more distal vessel. Noncalcified plaque is present on the left. 2. Atherosclerotic changes within the cavernous internal carotid arteries bilaterally without significant stenosis. 3. No other significant proximal stenosis, aneurysm, or branch vessel occlusion within the Circle of Willis. 4. Thyroid goiter with a dominant nodule in the left lobe measuring 3.2 x 2.8 cm. Recommend thyroid ultrasound (ref: J Am Coll Radiol. 2015 Feb;12(2): 143-50). Electronically Signed   By: San Morelle M.D.   On: 01/28/2020 17:08     Procedures Procedures (including critical care time)  Medications Ordered in ED Medications  ondansetron (ZOFRAN) injection 4 mg (4 mg Intravenous Given 01/28/20 1541)  meclizine (ANTIVERT) tablet 25 mg (25 mg Oral Given 01/28/20 1547)  iohexol (OMNIPAQUE) 350 MG/ML injection  100 mL (100 mLs Intravenous Contrast Given 01/28/20 1624)  lactated ringers bolus 1,000 mL ( Intravenous Stopped 01/28/20 1756)    ED Course  I have reviewed the triage vital signs and the nursing notes.  Pertinent labs & imaging results that were available during my care of the patient were reviewed by me and considered in my medical decision making (see chart for details).    MDM Rules/Calculators/A&P                      54 year old lady presenting to ER after sudden onset episode of dizziness, lightheadedness and possible syncope.  On arrival in ER, symptoms were resolving, patient was well-appearing.  She had a normal neurologic exam.  CTA head and neck ordered to further investigate, negative for acute process.  Based on physical exam findings, history and the CT findings, low suspicion for stroke, central etiology.  EKG without ischemic changes, no events on telemetry monitoring while in ER.  Suspect patient may have had vasovagal episode or BPPV.  Recommend follow-up and recheck with PCP.  Incidental finding noted on CT of thyroid nodule, will message PCP.    After the discussed management above, the patient was determined to be safe for discharge.  The patient was in agreement with this plan and all questions regarding their care were answered.  ED return precautions were discussed and the patient will return to the ED with any significant worsening of condition.   Final Clinical Impression(s) / ED Diagnoses Final diagnoses:  Dizziness    Rx / DC Orders ED Discharge Orders         Ordered    meclizine (ANTIVERT) 25 MG tablet  3 times daily PRN     01/28/20 1856           Lucrezia Starch, MD 01/29/20 1234

## 2020-01-29 NOTE — Telephone Encounter (Signed)
Please advise pt that I reviewed her ER note.  Radiologist saw a thyroid nodule. I would like her to complete a thyroid US for further evaluation. Order pended.  How is she feeling?

## 2020-01-30 ENCOUNTER — Telehealth: Payer: Self-pay | Admitting: Family

## 2020-01-30 MED ORDER — CEPHALEXIN 500 MG PO CAPS: 500.0000 mg | ORAL_CAPSULE | Freq: Two times a day (BID) | ORAL | 0 refills | Status: DC

## 2020-01-30 NOTE — Telephone Encounter (Signed)
Patient advised of results and provider's advise. She agrees with plan and will like to precede with [place]. Order for test signed.

## 2020-01-30 NOTE — Telephone Encounter (Signed)
Please let pt know that I sent an rx for keflex to her pharmacy for the infected hair follicles in her groin region.

## 2020-01-30 NOTE — Telephone Encounter (Signed)
Patient notified

## 2020-02-11 ENCOUNTER — Encounter: Payer: Self-pay | Admitting: Family

## 2020-02-11 DIAGNOSIS — M79605 Pain in left leg: Secondary | ICD-10-CM

## 2020-02-13 ENCOUNTER — Ambulatory Visit (HOSPITAL_BASED_OUTPATIENT_CLINIC_OR_DEPARTMENT_OTHER)
Admission: RE | Admit: 2020-02-13 | Discharge: 2020-02-13 | Disposition: A | Discharge status: Home / Self Care | Payer: BC Managed Care – PPO | Source: Ambulatory Visit | Attending: Family | Admitting: Family

## 2020-02-13 ENCOUNTER — Other Ambulatory Visit: Payer: Self-pay

## 2020-02-13 DIAGNOSIS — E041 Nontoxic single thyroid nodule: Secondary | ICD-10-CM | POA: Insufficient documentation

## 2020-02-13 DIAGNOSIS — M79605 Pain in left leg: Secondary | ICD-10-CM | POA: Diagnosis present

## 2020-02-14 ENCOUNTER — Encounter: Payer: Self-pay | Admitting: Family

## 2020-02-15 ENCOUNTER — Encounter: Payer: Self-pay | Admitting: Family

## 2020-02-28 ENCOUNTER — Other Ambulatory Visit: Payer: Self-pay | Admitting: Family

## 2020-03-23 ENCOUNTER — Other Ambulatory Visit (HOSPITAL_BASED_OUTPATIENT_CLINIC_OR_DEPARTMENT_OTHER): Payer: Self-pay | Admitting: Family

## 2020-03-23 ENCOUNTER — Other Ambulatory Visit: Payer: Self-pay

## 2020-03-23 ENCOUNTER — Ambulatory Visit (HOSPITAL_BASED_OUTPATIENT_CLINIC_OR_DEPARTMENT_OTHER)
Admission: RE | Admit: 2020-03-23 | Discharge: 2020-03-23 | Disposition: A | Discharge status: Home / Self Care | Payer: BC Managed Care – PPO | Source: Ambulatory Visit | Attending: Family | Admitting: Family

## 2020-03-23 DIAGNOSIS — Z1231 Encounter for screening mammogram for malignant neoplasm of breast: Secondary | ICD-10-CM | POA: Diagnosis present

## 2020-04-05 ENCOUNTER — Encounter: Payer: Self-pay | Admitting: Family

## 2020-04-15 ENCOUNTER — Encounter: Payer: Self-pay | Admitting: Family

## 2020-04-24 ENCOUNTER — Encounter: Payer: Self-pay | Admitting: Family

## 2020-06-03 ENCOUNTER — Other Ambulatory Visit: Payer: Self-pay | Admitting: Family

## 2020-06-07 ENCOUNTER — Encounter: Payer: Self-pay | Admitting: Family

## 2020-07-13 ENCOUNTER — Encounter: Payer: Self-pay | Admitting: Family

## 2020-08-11 ENCOUNTER — Ambulatory Visit (INDEPENDENT_AMBULATORY_CARE_PROVIDER_SITE_OTHER): Payer: BC Managed Care – PPO

## 2020-08-11 ENCOUNTER — Other Ambulatory Visit: Payer: Self-pay

## 2020-08-11 DIAGNOSIS — Z23 Encounter for immunization: Secondary | ICD-10-CM | POA: Diagnosis not present

## 2020-08-11 NOTE — Progress Notes (Signed)
Patient here today for shingrix vaccine 0.5mL given in left deltoid IM. Patient tolerated well. VIS given. [facility] updated.  

## 2020-09-01 ENCOUNTER — Other Ambulatory Visit: Payer: Self-pay | Admitting: Family

## 2020-09-08 ENCOUNTER — Encounter: Payer: Self-pay | Admitting: Family

## 2020-09-18 ENCOUNTER — Ambulatory Visit: Payer: BC Managed Care – PPO | Admitting: Family

## 2020-09-29 ENCOUNTER — Ambulatory Visit: Payer: BC Managed Care – PPO | Admitting: Family

## 2020-09-29 ENCOUNTER — Encounter: Payer: Self-pay | Admitting: Family

## 2020-09-29 ENCOUNTER — Other Ambulatory Visit: Payer: Self-pay

## 2020-09-29 VITALS — BP 171/77 | HR 70 | Temp 98.4°F | Resp 16 | Ht 64.0 in | Wt 318.0 lb

## 2020-09-29 DIAGNOSIS — I10 Essential (primary) hypertension: Secondary | ICD-10-CM

## 2020-09-29 DIAGNOSIS — D509 Iron deficiency anemia, unspecified: Secondary | ICD-10-CM

## 2020-09-29 MED ORDER — LISINOPRIL 40 MG PO TABS: 40.0000 mg | ORAL_TABLET | Freq: Every day | ORAL | 1 refills | Status: DC

## 2020-09-29 MED ORDER — CARVEDILOL 6.25 MG PO TABS: 6.2500 mg | ORAL_TABLET | Freq: Two times a day (BID) | ORAL | 3 refills | Status: DC

## 2020-09-29 NOTE — Progress Notes (Signed)
Subjective:    Patient ID: Marissa Bowman, female    DOB: Jan 11, 1966, 54 y.o.   MRN: [id number]  HPI  Patient is a 54 yr old female who presents today for follow up.   She states that she is taking lisinopril 40mg  but not taking the metoprolol which is on her list.  BP Readings from Last 3 Encounters:  09/29/20 (!) 171/77  01/28/20 (!) 127/112  01/28/20 (!) 166/53   Iron deficiency anemia- On iron 325mg  bid. This does cause her constipation and she takes a stool softener which helps. She is post-menopausal. Feels like she could do better with diet.   Lab Results  Component Value Date   WBC 7.9 01/28/2020   HGB 10.6 (L) 01/28/2020   HCT 33.6 (L) 01/28/2020   MCV 82.0 01/28/2020   PLT 303 01/28/2020    Review of Systems    see HPI   Past Medical History:  Diagnosis Date  . Anemia    on iron  . Asthma    childhood  . Hypertension      Social History   Socioeconomic History  . Marital status: Single    Spouse name: Not on file  . Number of children: Not on file  . Years of education: Not on file  . Highest education level: Not on file  Occupational History  . Not on file  Tobacco Use  . Smoking status: Never Smoker  . Smokeless tobacco: Never Used  Vaping Use  . Vaping Use: Never used  Substance and Sexual Activity  . Alcohol use: No  . Drug use: No  . Sexual activity: Not on file  Other Topics Concern  . Not on file  Social History Narrative   1 son- age 33   Unemployed, plans to sub with the [employer]   Associated degree   Lives with mother father/father and son   Has 1 dog lab/spaniel mix   Enjoys reading   Social Determinants of Health   Financial Resource Strain: Not on file  Food Insecurity: Not on file  Transportation Needs: Not on file  Physical Activity: Not on file  Stress: Not on file  Social Connections: Not on file  Intimate Partner Violence: Not on file    Past Surgical History:  Procedure Laterality Date  . COLONOSCOPY     . COLONOSCOPY WITH PROPOFOL N/A 02/03/2017   Procedure: COLONOSCOPY WITH PROPOFOL;  Surgeon: Mauri Pole, MD;  Location: [facility];  Service: Endoscopy;  Laterality: N/A;  . NO PAST SURGERIES      Family History  Problem Relation Age of Onset  . Hypertension Mother   . Hyperlipidemia Mother   . Diabetes Mother   . Lung cancer Father   . Thyroid disease Sister   . Fibromyalgia Sister   . Colon cancer Neg Hx     Allergies  Allergen Reactions  . Latex     rash    Current Outpatient Medications on File Prior to Visit  Medication Sig Dispense Refill  . ferrous sulfate 325 (65 FE) MG tablet Take 325 mg by mouth 2 (two) times daily with a meal.     . lisinopril (ZESTRIL) 40 MG tablet Take 1 tablet (40 mg total) by mouth daily. 30 tablet 1   No current facility-administered medications on file prior to visit.    BP (!) 171/77 (BP Location: Right Arm, Patient Position: Sitting, Cuff Size: Large)   Pulse 70   Temp 98.4 F (36.9 C) (  Oral)   Resp 16   Ht 5\' 4"  (1.626 m)   Wt (!) 318 lb (144.2 kg)   LMP 01/27/2017   SpO2 99%   BMI 54.58 kg/m    Objective:   Physical Exam Constitutional:      Appearance: She is well-developed and well-nourished.  Neck:     Thyroid: No thyromegaly.  Cardiovascular:     Rate and Rhythm: Normal rate and regular rhythm.     Heart sounds: Normal heart sounds. No murmur heard.   Pulmonary:     Effort: Pulmonary effort is normal. No respiratory distress.     Breath sounds: Normal breath sounds. No wheezing.  Musculoskeletal:     Cervical back: Neck supple.  Skin:    General: Skin is warm and dry.  Neurological:     Mental Status: She is alert and oriented to person, place, and time.  Psychiatric:        Mood and Affect: Mood and affect normal.        Behavior: Behavior normal.        Thought Content: Thought content normal.        Judgment: Judgment normal.           Assessment & Plan:  HTN- uncontrolled. Continue  lisinopril 40mg . Add coreg 6.25mg  bid. Follow up in 2 weeks. Check follow up bmet.   Iron deficiency anemia- we discussed a diet rich in iron.  Continue iron supplement 325mg  bid and prn colace. Check follow up cbc, iron studies.  Recommended that she obtain her covid booster.  This visit occurred during the SARS-CoV-2 public health emergency.  Safety protocols were in place, including screening questions prior to the visit, additional usage of staff PPE, and extensive cleaning of exam room while observing appropriate contact time as indicated for disinfecting solutions.

## 2020-09-29 NOTE — Patient Instructions (Addendum)
Please start coreg 6.25 mg twice daily. Continue lisinopril 40mg  once daily.   Iron-Rich Diet  Iron is a mineral that helps your body to produce hemoglobin. Hemoglobin is a protein in red blood cells that carries oxygen to your body's tissues. Eating too little iron may cause you to feel weak and tired, and it can increase your risk of infection. Iron is naturally found in many foods, and many foods have iron added to them (iron-fortified foods). You may need to follow an iron-rich diet if you do not have enough iron in your body due to certain medical conditions. The amount of iron that you need each day depends on your age, your sex, and any medical conditions you have. Follow instructions from your health care provider or a diet and nutrition specialist (dietitian) about how much iron you should eat each day. What are tips for following this plan? Reading food labels  Check food labels to see how many milligrams (mg) of iron are in each serving. Cooking  Cook foods in pots and pans that are made from iron.  Take these steps to make it easier for your body to absorb iron from certain foods: ? Soak beans overnight before cooking. ? Soak whole grains overnight and drain them before using. ? Ferment flours before baking, such as by using yeast in bread dough. Meal planning  When you eat foods that contain iron, you should eat them with foods that are high in vitamin C. These include oranges, peppers, tomatoes, potatoes, and mango. Vitamin C helps your body to absorb iron. General information  Take iron supplements only as told by your health care provider. An overdose of iron can be life-threatening. If you were prescribed iron supplements, take them with orange juice or a vitamin C supplement.  When you eat iron-fortified foods or take an iron supplement, you should also eat foods that naturally contain iron, such as meat, poultry, and fish. Eating naturally iron-rich foods helps your body  to absorb the iron that is added to other foods or contained in a supplement.  Certain foods and drinks prevent your body from absorbing iron properly. Avoid eating these foods in the same meal as iron-rich foods or with iron supplements. These foods include: ? Coffee, black tea, and red wine. ? Milk, dairy products, and foods that are high in calcium. ? Beans and soybeans. ? Whole grains. What foods should I eat? Fruits Prunes. Raisins. Eat fruits high in vitamin C, such as oranges, grapefruits, and strawberries, alongside iron-rich foods. Vegetables Spinach (cooked). Green peas. Broccoli. Fermented vegetables. Eat vegetables high in vitamin C, such as leafy greens, potatoes, bell peppers, and tomatoes, alongside iron-rich foods. Grains Iron-fortified breakfast cereal. Iron-fortified whole-wheat bread. Enriched rice. Sprouted grains. Meats and other proteins Beef liver. Oysters. Beef. Shrimp. [place]. Chicken. [place]. Sardines. Chickpeas. Nuts. Tofu. Pumpkin seeds. Beverages Tomato juice. Fresh orange juice. Prune juice. Hibiscus tea. Fortified instant breakfast shakes. Sweets and desserts Blackstrap molasses. Seasonings and condiments Tahini. Fermented soy sauce. Other foods Wheat germ. The items listed above may not be a complete list of recommended foods and beverages. Contact a dietitian for more information. What foods should I avoid? Grains Whole grains. Bran cereal. Bran flour. Oats. Meats and other proteins Soybeans. Products made from soy protein. Black beans. Lentils. Mung beans. Split peas. Dairy Milk. Cream. Cheese. Yogurt. Cottage cheese. Beverages Coffee. Black tea. Red wine. Sweets and desserts Cocoa. Chocolate. Ice cream. Other foods Basil. Oregano. Large amounts of parsley. The items  listed above may not be a complete list of foods and beverages to avoid. Contact a dietitian for more information. Summary  Iron is a mineral that helps your body to produce  hemoglobin. Hemoglobin is a protein in red blood cells that carries oxygen to your body's tissues.  Iron is naturally found in many foods, and many foods have iron added to them (iron-fortified foods).  When you eat foods that contain iron, you should eat them with foods that are high in vitamin C. Vitamin C helps your body to absorb iron.  Certain foods and drinks prevent your body from absorbing iron properly, such as whole grains and dairy products. You should avoid eating these foods in the same meal as iron-rich foods or with iron supplements. This information is not intended to replace advice given to you by your health care provider. Make sure you discuss any questions you have with your health care provider. Document Revised: 09/15/2017 Document Reviewed: 08/29/2017 Elsevier Patient Education  2020 [employer].

## 2020-09-30 LAB — CBC WITH DIFFERENTIAL/PLATELET
Basophils Absolute: 0 10*3/uL (ref 0.0–0.1)
Basophils Relative: 0.5 % (ref 0.0–3.0)
Eosinophils Absolute: 0.2 10*3/uL (ref 0.0–0.7)
Eosinophils Relative: 2.8 % (ref 0.0–5.0)
HCT: 35.6 % — ABNORMAL LOW (ref 36.0–46.0)
Hemoglobin: 11.7 g/dL — ABNORMAL LOW (ref 12.0–15.0)
Lymphocytes Relative: 16.9 % (ref 12.0–46.0)
Lymphs Abs: 1.2 10*3/uL (ref 0.7–4.0)
MCHC: 32.9 g/dL (ref 30.0–36.0)
MCV: 79.4 fl (ref 78.0–100.0)
Monocytes Absolute: 0.3 10*3/uL (ref 0.1–1.0)
Monocytes Relative: 4.7 % (ref 3.0–12.0)
Neutro Abs: 5.5 10*3/uL (ref 1.4–7.7)
Neutrophils Relative %: 75.1 % (ref 43.0–77.0)
Platelets: 315 10*3/uL (ref 150.0–400.0)
RBC: 4.49 Mil/uL (ref 3.87–5.11)
RDW: 14.7 % (ref 11.5–15.5)
WBC: 7.3 10*3/uL (ref 4.0–10.5)

## 2020-09-30 LAB — BASIC METABOLIC PANEL
BUN: 13 mg/dL (ref 6–23)
CO2: 30 mEq/L (ref 19–32)
Calcium: 9 mg/dL (ref 8.4–10.5)
Chloride: 101 mEq/L (ref 96–112)
Creatinine, Ser: 0.71 mg/dL (ref 0.40–1.20)
GFR: 94.78 mL/min (ref >60.00)
Glucose, Bld: 78 mg/dL (ref 70–99)
Potassium: 3.7 mEq/L (ref 3.5–5.1)
Sodium: 141 mEq/L (ref 135–145)

## 2020-09-30 LAB — FERRITIN: Ferritin: 205.8 ng/mL (ref 10.0–291.0)

## 2020-09-30 LAB — IRON: Iron: 27 ug/dL — ABNORMAL LOW (ref 42–145)

## 2020-10-20 ENCOUNTER — Ambulatory Visit: Payer: BC Managed Care – PPO

## 2020-10-20 ENCOUNTER — Ambulatory Visit (INDEPENDENT_AMBULATORY_CARE_PROVIDER_SITE_OTHER): Payer: BC Managed Care – PPO | Admitting: Family

## 2020-10-20 ENCOUNTER — Other Ambulatory Visit: Payer: Self-pay

## 2020-10-20 VITALS — BP 155/67 | HR 70 | Temp 97.4°F | Resp 16 | Wt 324.0 lb

## 2020-10-20 DIAGNOSIS — I10 Essential (primary) hypertension: Secondary | ICD-10-CM

## 2020-10-20 DIAGNOSIS — Z23 Encounter for immunization: Secondary | ICD-10-CM

## 2020-10-20 NOTE — Patient Instructions (Signed)
Please increase the carvedilol to twice daily.

## 2020-10-20 NOTE — Progress Notes (Signed)
Subjective:    Patient ID: Marissa Bowman, female    DOB: [birth year], 55 y.o.   MRN: [id number]  HPI  Patient is a 55 yr old female who presents today for follow up of her uncontrolled HTN.  Last visit we added coreg 6.25mg  bid and continued lisinopril 40mg . The patient got confused and has only been taking the coreg once daily.  BP Readings from Last 3 Encounters:  10/20/20 (!) 155/67  09/29/20 (!) 171/77  01/28/20 (!) 127/112      Review of Systems    see HPI  Past Medical History:  Diagnosis Date  . Anemia    on iron  . Asthma    childhood  . Hypertension      Social History   Socioeconomic History  . Marital status: Single    Spouse name: Not on file  . Number of children: Not on file  . Years of education: Not on file  . Highest education level: Not on file  Occupational History  . Not on file  Tobacco Use  . Smoking status: Never Smoker  . Smokeless tobacco: Never Used  Vaping Use  . Vaping Use: Never used  Substance and Sexual Activity  . Alcohol use: No  . Drug use: No  . Sexual activity: Not on file  Other Topics Concern  . Not on file  Social History Narrative   1 son- age 87   Unemployed, plans to sub with the [employer]   Associated degree   Lives with mother father/father and son   Has 1 dog lab/spaniel mix   Enjoys reading   Social Determinants of Health   Financial Resource Strain: Not on file  Food Insecurity: Not on file  Transportation Needs: Not on file  Physical Activity: Not on file  Stress: Not on file  Social Connections: Not on file  Intimate Partner Violence: Not on file    Past Surgical History:  Procedure Laterality Date  . COLONOSCOPY    . COLONOSCOPY WITH PROPOFOL N/A 02/03/2017   Procedure: COLONOSCOPY WITH PROPOFOL;  Surgeon: Mauri Pole, MD;  Location: [facility];  Service: Endoscopy;  Laterality: N/A;  . NO PAST SURGERIES      Family History  Problem Relation Age of Onset  . Hypertension Mother    . Hyperlipidemia Mother   . Diabetes Mother   . Lung cancer Father   . Thyroid disease Sister   . Fibromyalgia Sister   . Colon cancer Neg Hx     Allergies  Allergen Reactions  . Latex     rash    Current Outpatient Medications on File Prior to Visit  Medication Sig Dispense Refill  . carvedilol (COREG) 6.25 MG tablet Take 1 tablet (6.25 mg total) by mouth 2 (two) times daily with a meal. 60 tablet 3  . ferrous sulfate 325 (65 FE) MG tablet Take 325 mg by mouth 2 (two) times daily with a meal.     . lisinopril (ZESTRIL) 40 MG tablet Take 1 tablet (40 mg total) by mouth daily. 90 tablet 1   No current facility-administered medications on file prior to visit.    BP (!) 155/67 (BP Location: Right Arm, Patient Position: Sitting, Cuff Size: Large)   Pulse 70   Temp (!) 97.4 F (36.3 C) (Temporal)   Resp 16   Wt (!) 324 lb (147 kg)   LMP 01/27/2017   SpO2 100%   BMI 55.61 kg/m    Objective:  Physical Exam Constitutional:      Appearance: She is well-developed and well-nourished.  Cardiovascular:     Rate and Rhythm: Normal rate and regular rhythm.     Heart sounds: Normal heart sounds. No murmur heard.   Pulmonary:     Effort: Pulmonary effort is normal. No respiratory distress.     Breath sounds: Normal breath sounds. No wheezing.  Psychiatric:        Mood and Affect: Mood and affect normal.        Behavior: Behavior normal.        Thought Content: Thought content normal.        Judgment: Judgment normal.           Assessment & Plan:  HTN- bp above goal but improving. Advised pt to increase coreg to bid and to follow back up in a few weeks.    This visit occurred during the SARS-CoV-2 public health emergency.  Safety protocols were in place, including screening questions prior to the visit, additional usage of staff PPE, and extensive cleaning of exam room while observing appropriate contact time as indicated for disinfecting solutions.

## 2020-10-20 NOTE — Progress Notes (Deleted)
0

## 2020-11-02 ENCOUNTER — Ambulatory Visit: Payer: BC Managed Care – PPO

## 2020-11-09 ENCOUNTER — Ambulatory Visit: Payer: BC Managed Care – PPO | Attending: Internal Medicine

## 2020-11-09 ENCOUNTER — Other Ambulatory Visit (HOSPITAL_BASED_OUTPATIENT_CLINIC_OR_DEPARTMENT_OTHER): Payer: Self-pay | Admitting: Internal Medicine

## 2020-11-09 DIAGNOSIS — Z23 Encounter for immunization: Secondary | ICD-10-CM

## 2020-11-09 MED FILL — PFIZER-BIONTECH COVID-19 VA: 30 | 21 days supply | Qty: 0 | Fill #0

## 2020-11-09 NOTE — Progress Notes (Signed)
   Covid-19 Vaccination Clinic  Name:  Marissa Bowman    MRN: [id number] DOB: Jan 26, 1966  11/09/2020  [name] was observed post Covid-19 immunization for 15 minutes without incident. She was provided with Vaccine Information Sheet and instruction to access the V-Safe system.   [name] was instructed to call 911 with any severe reactions post vaccine: Marland Kitchen Difficulty breathing  . Swelling of face and throat  . A fast heartbeat  . A bad rash all over body  . Dizziness and weakness   Immunizations Administered    Name Date Dose VIS Date Route   Pfizer COVID-19 Vaccine 11/09/2020  2:26 PM 0.3 mL 08/05/2020 Intramuscular   Manufacturer: [place]   Lot: [id number]   NDC: [address]-2

## 2020-11-10 ENCOUNTER — Ambulatory Visit: Payer: BC Managed Care – PPO | Admitting: Family

## 2020-12-08 ENCOUNTER — Ambulatory Visit: Payer: BC Managed Care – PPO | Admitting: Family

## 2020-12-08 ENCOUNTER — Encounter: Payer: Self-pay | Admitting: Family

## 2020-12-08 ENCOUNTER — Other Ambulatory Visit: Payer: Self-pay

## 2020-12-08 VITALS — BP 184/83 | HR 59 | Temp 98.2°F | Resp 16 | Ht 64.0 in | Wt 328.0 lb

## 2020-12-08 DIAGNOSIS — I10 Essential (primary) hypertension: Secondary | ICD-10-CM

## 2020-12-08 DIAGNOSIS — D509 Iron deficiency anemia, unspecified: Secondary | ICD-10-CM | POA: Diagnosis not present

## 2020-12-08 LAB — CBC WITH DIFFERENTIAL/PLATELET
Basophils Absolute: 0.1 10*3/uL (ref 0.0–0.1)
Basophils Relative: 0.9 % (ref 0.0–3.0)
Eosinophils Absolute: 0.3 10*3/uL (ref 0.0–0.7)
Eosinophils Relative: 4 % (ref 0.0–5.0)
HCT: 34.7 % — ABNORMAL LOW (ref 36.0–46.0)
Hemoglobin: 11.4 g/dL — ABNORMAL LOW (ref 12.0–15.0)
Lymphocytes Relative: 19.3 % (ref 12.0–46.0)
Lymphs Abs: 1.4 10*3/uL (ref 0.7–4.0)
MCHC: 32.8 g/dL (ref 30.0–36.0)
MCV: 79 fl (ref 78.0–100.0)
Monocytes Absolute: 0.4 10*3/uL (ref 0.1–1.0)
Monocytes Relative: 6 % (ref 3.0–12.0)
Neutro Abs: 4.9 10*3/uL (ref 1.4–7.7)
Neutrophils Relative %: 69.8 % (ref 43.0–77.0)
Platelets: 305 10*3/uL (ref 150.0–400.0)
RBC: 4.38 Mil/uL (ref 3.87–5.11)
RDW: 15.3 % (ref 11.5–15.5)
WBC: 7.1 10*3/uL (ref 4.0–10.5)

## 2020-12-08 LAB — BASIC METABOLIC PANEL
BUN: 13 mg/dL (ref 6–23)
CO2: 33 mEq/L — ABNORMAL HIGH (ref 19–32)
Calcium: 9.3 mg/dL (ref 8.4–10.5)
Chloride: 101 mEq/L (ref 96–112)
Creatinine, Ser: 0.6 mg/dL (ref 0.40–1.20)
GFR: 99.92 mL/min (ref >60.00)
Glucose, Bld: 81 mg/dL (ref 70–99)
Potassium: 4 mEq/L (ref 3.5–5.1)
Sodium: 140 mEq/L (ref 135–145)

## 2020-12-08 LAB — IRON: Iron: 62 ug/dL (ref 42–145)

## 2020-12-08 LAB — FERRITIN: Ferritin: 176.6 ng/mL (ref 10.0–291.0)

## 2020-12-08 MED ORDER — POTASSIUM CHLORIDE ER 10 MEQ PO TBCR: 10.0000 meq | EXTENDED_RELEASE_TABLET | Freq: Every day | ORAL | 30 refills | Status: DC

## 2020-12-08 MED ORDER — HYDROCHLOROTHIAZIDE 25 MG PO TABS
25.0000 mg | ORAL_TABLET | Freq: Every day | ORAL | 1 refills | Status: DC
Start: 2020-12-08 — End: 2021-04-16

## 2020-12-08 NOTE — Progress Notes (Signed)
Subjective:    Patient ID: Marissa Bowman, female    DOB: 1966-05-07, 55 y.o.   MRN: [id number]  HPI  Patient is a 55 yr old female who presents today for follow up.   HTN- blood pressure medications include coreg 6.25mg  bid and lisinopril 40mg .  She reports good compliance with the medication.  BP Readings from Last 3 Encounters:  12/08/20 (!) 184/83  10/20/20 (!) 155/67  09/29/20 (!) 171/77   Iron deficiency anemia-patient reports good compliance with her iron 325 mg. Lab Results  Component Value Date   WBC 7.3 09/29/2020   HGB 11.7 (L) 09/29/2020   HCT 35.6 (L) 09/29/2020   MCV 79.4 09/29/2020   PLT 315.0 09/29/2020     Review of Systems    see HPI  Past Medical History:  Diagnosis Date  . Anemia    on iron  . Asthma    childhood  . Hypertension      Social History   Socioeconomic History  . Marital status: Single    Spouse name: Not on file  . Number of children: Not on file  . Years of education: Not on file  . Highest education level: Not on file  Occupational History  . Not on file  Tobacco Use  . Smoking status: Never Smoker  . Smokeless tobacco: Never Used  Vaping Use  . Vaping Use: Never used  Substance and Sexual Activity  . Alcohol use: No  . Drug use: No  . Sexual activity: Not on file  Other Topics Concern  . Not on file  Social History Narrative   1 son- age 76   Unemployed, plans to sub with the [employer]   Associated degree   Lives with mother father/father and son   Has 1 dog lab/spaniel mix   Enjoys reading   Social Determinants of Health   Financial Resource Strain: Not on file  Food Insecurity: Not on file  Transportation Needs: Not on file  Physical Activity: Not on file  Stress: Not on file  Social Connections: Not on file  Intimate Partner Violence: Not on file    Past Surgical History:  Procedure Laterality Date  . COLONOSCOPY    . COLONOSCOPY WITH PROPOFOL N/A 02/03/2017   Procedure: COLONOSCOPY WITH  PROPOFOL;  Surgeon: Mauri Pole, MD;  Location: [facility];  Service: Endoscopy;  Laterality: N/A;  . NO PAST SURGERIES      Family History  Problem Relation Age of Onset  . Hypertension Mother   . Hyperlipidemia Mother   . Diabetes Mother   . Lung cancer Father   . Thyroid disease Sister   . Fibromyalgia Sister   . Colon cancer Neg Hx     Allergies  Allergen Reactions  . Latex     rash    Current Outpatient Medications on File Prior to Visit  Medication Sig Dispense Refill  . carvedilol (COREG) 6.25 MG tablet Take 1 tablet (6.25 mg total) by mouth 2 (two) times daily with a meal. 60 tablet 3  . ferrous sulfate 325 (65 FE) MG tablet Take 325 mg by mouth 2 (two) times daily with a meal.     . lisinopril (ZESTRIL) 40 MG tablet Take 1 tablet (40 mg total) by mouth daily. 90 tablet 1   No current facility-administered medications on file prior to visit.    BP (!) 184/83 (BP Location: Right Arm, Patient Position: Sitting, Cuff Size: Large)   Pulse (!) 59  Temp 98.2 F (36.8 C) (Oral)   Resp 16   Ht 5\' 4"  (1.626 m)   Wt (!) 328 lb (148.8 kg)   LMP 01/27/2017   SpO2 99%   BMI 56.30 kg/m    Objective:   Physical Exam Constitutional:      Appearance: She is well-developed and well-nourished.  Cardiovascular:     Rate and Rhythm: Normal rate and regular rhythm.     Heart sounds: Normal heart sounds. No murmur heard.   Pulmonary:     Effort: Pulmonary effort is normal. No respiratory distress.     Breath sounds: Normal breath sounds. No wheezing.  Psychiatric:        Mood and Affect: Mood and affect normal.        Behavior: Behavior normal.        Thought Content: Thought content normal.        Judgment: Judgment normal.           Assessment & Plan:  Hypertension-uncontrolled.  Will retrial HCTZ 25 mg p.o. daily.  I will give her a low-dose K-Dur 10 milliequivalents once daily as she has had issues with hypokalemia in the past.  Need to monitor this  closely as she is also on an ACE inhibitor.  She will return in 1 week.  Iron deficiency anemia-clinically stable.  Continue iron 325 mg p.o. twice daily.  Will obtain follow-up CBC and iron studies.  This visit occurred during the SARS-CoV-2 public health emergency.  Safety protocols were in place, including screening questions prior to the visit, additional usage of staff PPE, and extensive cleaning of exam room while observing appropriate contact time as indicated for disinfecting solutions.

## 2020-12-08 NOTE — Patient Instructions (Signed)
Please begin hctz (fluid pill) and Kdur (potassium supplement). Complete lab work prior to leaving.

## 2020-12-15 ENCOUNTER — Other Ambulatory Visit: Payer: Self-pay

## 2020-12-15 ENCOUNTER — Ambulatory Visit: Payer: BC Managed Care – PPO | Admitting: Family

## 2020-12-15 VITALS — BP 142/78 | HR 67 | Temp 98.5°F | Resp 16 | Wt 325.2 lb

## 2020-12-15 DIAGNOSIS — I10 Essential (primary) hypertension: Secondary | ICD-10-CM

## 2020-12-15 DIAGNOSIS — D509 Iron deficiency anemia, unspecified: Secondary | ICD-10-CM | POA: Diagnosis not present

## 2020-12-15 NOTE — Patient Instructions (Signed)
Please complete lab work prior to leaving.   

## 2020-12-15 NOTE — Progress Notes (Signed)
Subjective:    Patient ID: Marissa Bowman, female    DOB: 07-29-1966, 55 y.o.   MRN: [id number]  HPI  Patient is a 55 yr old female who presents today for follow up of her blood pressure. Last visit, we added HCTZ 25mg  once daily to her regimen.   BP Readings from Last 3 Encounters:  12/15/20 (!) 142/78  12/08/20 (!) 184/83  10/20/20 (!) 155/67      Review of Systems See HPI  Past Medical History:  Diagnosis Date  . Anemia    on iron  . Asthma    childhood  . Hypertension      Social History   Socioeconomic History  . Marital status: Single    Spouse name: Not on file  . Number of children: Not on file  . Years of education: Not on file  . Highest education level: Not on file  Occupational History  . Not on file  Tobacco Use  . Smoking status: Never Smoker  . Smokeless tobacco: Never Used  Vaping Use  . Vaping Use: Never used  Substance and Sexual Activity  . Alcohol use: No  . Drug use: No  . Sexual activity: Not on file  Other Topics Concern  . Not on file  Social History Narrative   1 son- age 23   Unemployed, plans to sub with the [employer]   Associated degree   Lives with mother father/father and son   Has 1 dog lab/spaniel mix   Enjoys reading   Social Determinants of Health   Financial Resource Strain: Not on file  Food Insecurity: Not on file  Transportation Needs: Not on file  Physical Activity: Not on file  Stress: Not on file  Social Connections: Not on file  Intimate Partner Violence: Not on file    Past Surgical History:  Procedure Laterality Date  . COLONOSCOPY    . COLONOSCOPY WITH PROPOFOL N/A 02/03/2017   Procedure: COLONOSCOPY WITH PROPOFOL;  Surgeon: Mauri Pole, MD;  Location: [facility];  Service: Endoscopy;  Laterality: N/A;  . NO PAST SURGERIES      Family History  Problem Relation Age of Onset  . Hypertension Mother   . Hyperlipidemia Mother   . Diabetes Mother   . Lung cancer Father   . Thyroid  disease Sister   . Fibromyalgia Sister   . Colon cancer Neg Hx     Allergies  Allergen Reactions  . Latex     rash    Current Outpatient Medications on File Prior to Visit  Medication Sig Dispense Refill  . carvedilol (COREG) 6.25 MG tablet Take 1 tablet (6.25 mg total) by mouth 2 (two) times daily with a meal. 60 tablet 3  . ferrous sulfate 325 (65 FE) MG tablet Take 325 mg by mouth 2 (two) times daily with a meal.     . hydrochlorothiazide (HYDRODIURIL) 25 MG tablet Take 1 tablet (25 mg total) by mouth daily. 90 tablet 1  . lisinopril (ZESTRIL) 40 MG tablet Take 1 tablet (40 mg total) by mouth daily. 90 tablet 1  . potassium chloride (KLOR-CON) 10 MEQ tablet Take 1 tablet (10 mEq total) by mouth daily. 30 tablet 30   No current facility-administered medications on file prior to visit.    BP (!) 142/78 (BP Location: Right Arm, Patient Position: Sitting, Cuff Size: Large)   Pulse 67   Temp 98.5 F (36.9 C) (Oral)   Resp 16   Wt (!) 325  lb 3.2 oz (147.5 kg)   LMP 01/27/2017   SpO2 100%   BMI 55.82 kg/m       Objective:   Physical Exam Constitutional:      Appearance: She is well-developed and well-nourished.  Cardiovascular:     Rate and Rhythm: Normal rate and regular rhythm.     Heart sounds: Normal heart sounds. No murmur heard.   Pulmonary:     Effort: Pulmonary effort is normal. No respiratory distress.     Breath sounds: Normal breath sounds. No wheezing.  Psychiatric:        Mood and Affect: Mood and affect normal.        Behavior: Behavior normal.        Thought Content: Thought content normal.        Judgment: Judgment normal.           Assessment & Plan:  HTN- bp is improved, continue hctz 25mg  once daily. Obtain follow up bmet.   Iron deficiency anemia- reviewed results with pt.  CBC/iron studies stable. Continue iron 325mg  twice daily PO.  This visit occurred during the SARS-CoV-2 public health emergency.  Safety protocols were in place,  including screening questions prior to the visit, additional usage of staff PPE, and extensive cleaning of exam room while observing appropriate contact time as indicated for disinfecting solutions.

## 2020-12-16 LAB — BASIC METABOLIC PANEL
BUN: 15 mg/dL (ref 6–23)
CO2: 33 mEq/L — ABNORMAL HIGH (ref 19–32)
Calcium: 9.3 mg/dL (ref 8.4–10.5)
Chloride: 97 mEq/L (ref 96–112)
Creatinine, Ser: 0.6 mg/dL (ref 0.40–1.20)
GFR: 99.91 mL/min (ref >60.00)
Glucose, Bld: 84 mg/dL (ref 70–99)
Potassium: 3.8 mEq/L (ref 3.5–5.1)
Sodium: 138 mEq/L (ref 135–145)

## 2021-01-25 ENCOUNTER — Ambulatory Visit: Payer: BC Managed Care – PPO | Admitting: Internal Medicine

## 2021-02-10 ENCOUNTER — Other Ambulatory Visit: Payer: Self-pay | Admitting: Family

## 2021-03-01 ENCOUNTER — Other Ambulatory Visit: Payer: Self-pay

## 2021-03-01 ENCOUNTER — Telehealth: Payer: Self-pay

## 2021-03-01 ENCOUNTER — Telehealth (INDEPENDENT_AMBULATORY_CARE_PROVIDER_SITE_OTHER): Payer: BC Managed Care – PPO | Admitting: Family

## 2021-03-01 VITALS — BP 142/72

## 2021-03-01 DIAGNOSIS — J40 Bronchitis, not specified as acute or chronic: Secondary | ICD-10-CM | POA: Diagnosis not present

## 2021-03-01 MED ORDER — AZITHROMYCIN 250 MG PO TABS: ORAL_TABLET | ORAL | 0 refills | Status: AC

## 2021-03-01 MED ORDER — BENZONATATE 100 MG PO CAPS: 100.0000 mg | ORAL_CAPSULE | Freq: Three times a day (TID) | ORAL | 0 refills | Status: DC | PRN

## 2021-03-01 NOTE — Assessment & Plan Note (Signed)
New. Will rx with azithromycin 500mg  today, then 250mg  once daily for 4 more days. Tessalon 100mg  TID prn cough. She is advised to call if symptoms worsen or if not improved in 2-3 days.

## 2021-03-01 NOTE — Telephone Encounter (Signed)
[facility] - Client Client Site [facility] - Night Physician [name] - NP Contact Type Call Who Is Calling Patient / Member / Family / Caregiver Caller Name Hammond Phone Number [phone] Patient Name Marissa Bowman Patient DOB 1966-09-30 Call Type Message Only Information Provided Reason for Call: Request to Schedule Office Appointment Initial Comment Caller states she is wanting to schedule an appt. for today. Patient request to speak to RN No Additional Comment Office hours provided . Declined triage  Pt had a VV with Melissa today at 8:40 am. -JMA

## 2021-03-01 NOTE — Progress Notes (Signed)
MyChart Video Visit    Virtual Visit via Video Note   This visit type was conducted due to national recommendations for restrictions regarding the COVID-19 Pandemic (e.g. social distancing) in an effort to limit this patient's exposure and mitigate transmission in our community. This patient is at least at moderate risk for complications without adequate follow up. This format is felt to be most appropriate for this patient at this time. Physical exam was limited by quality of the video and audio technology used for the visit. CMA was able to get the patient set up on a video visit.  Patient location: home. Patient and provider in visit Provider location: Office  I discussed the limitations of evaluation and management by telemedicine and the availability of in person appointments. The patient expressed understanding and agreed to proceed.  Visit Date: 03/01/2021  Today's healthcare provider: [name], NP     Subjective:    Patient ID: Marissa Bowman, female    DOB: 08-18-1966, 55 y.o.   MRN: [id number]  Chief Complaint  Patient presents with  . Cough    Complains of productive cough  . Nasal Congestion  . Chest congestion    Complains of congestion on her chest    HPI Patient is in today with complaint of cough.  Denies fever.  Report that her cough began 1 week ago.  She had a negative home covid test. + chest congestion. She denies SOB.  She reports some sweating.  + anorexia.  + sore throat (just scratchy).  Mild nasal congestion. She has been taking zyrtec once daily.   Past Medical History:  Diagnosis Date  . Anemia    on iron  . Asthma    childhood  . Hypertension     Past Surgical History:  Procedure Laterality Date  . COLONOSCOPY    . COLONOSCOPY WITH PROPOFOL N/A 02/03/2017   Procedure: COLONOSCOPY WITH PROPOFOL;  Surgeon: Mauri Pole, MD;  Location: [facility];  Service: Endoscopy;  Laterality: N/A;  . NO PAST SURGERIES       Family History  Problem Relation Age of Onset  . Hypertension Mother   . Hyperlipidemia Mother   . Diabetes Mother   . Lung cancer Father   . Thyroid disease Sister   . Fibromyalgia Sister   . Colon cancer Neg Hx     Social History   Socioeconomic History  . Marital status: Single    Spouse name: Not on file  . Number of children: Not on file  . Years of education: Not on file  . Highest education level: Not on file  Occupational History  . Not on file  Tobacco Use  . Smoking status: Never Smoker  . Smokeless tobacco: Never Used  Vaping Use  . Vaping Use: Never used  Substance and Sexual Activity  . Alcohol use: No  . Drug use: No  . Sexual activity: Not on file  Other Topics Concern  . Not on file  Social History Narrative   1 son- age 40   Unemployed, plans to sub with the [employer]   Associated degree   Lives with mother father/father and son   Has 1 dog lab/spaniel mix   Enjoys reading   Social Determinants of Health   Financial Resource Strain: Not on file  Food Insecurity: Not on file  Transportation Needs: Not on file  Physical Activity: Not on file  Stress: Not on file  Social Connections: Not on file  Intimate Partner Violence: Not on file    Outpatient Medications Prior to Visit  Medication Sig Dispense Refill  . carvedilol (COREG) 6.25 MG tablet TAKE ONE TABLET BY MOUTH TWICE A DAY WITH A MEAL 60 tablet 3  . COVID-19 mRNA vaccine, Pfizer, 30 MCG/0.3ML injection INJECT AS DIRECTED .3 mL 0  . ferrous sulfate 325 (65 FE) MG tablet Take 325 mg by mouth 2 (two) times daily with a meal.     . hydrochlorothiazide (HYDRODIURIL) 25 MG tablet Take 1 tablet (25 mg total) by mouth daily. 90 tablet 1  . lisinopril (ZESTRIL) 40 MG tablet Take 1 tablet (40 mg total) by mouth daily. 90 tablet 1  . potassium chloride (KLOR-CON) 10 MEQ tablet Take 1 tablet (10 mEq total) by mouth daily. 30 tablet 30   No facility-administered medications prior to visit.     Allergies  Allergen Reactions  . Latex     rash    ROS    see HPI Objective:    Physical Exam Constitutional:      Appearance: Normal appearance.  HENT:     Head: Normocephalic and atraumatic.     Comments: Mild hoarseness Chest:     Comments: Coarse cough noted Psychiatric:        Attention and Perception: Attention normal.        Mood and Affect: Mood normal.        Speech: Speech normal.        Behavior: Behavior normal.        Thought Content: Thought content normal.        Cognition and Memory: Cognition normal.     BP (!) 142/72   LMP 01/27/2017  Wt Readings from Last 3 Encounters:  12/15/20 (!) 325 lb 3.2 oz (147.5 kg)  12/08/20 (!) 328 lb (148.8 kg)  10/20/20 (!) 324 lb (147 kg)    Diabetic Foot Exam - Simple   No data filed    Lab Results  Component Value Date   WBC 7.1 12/08/2020   HGB 11.4 (L) 12/08/2020   HCT 34.7 (L) 12/08/2020   PLT 305.0 12/08/2020   GLUCOSE 84 12/15/2020   CHOL 201 (H) 09/20/2017   TRIG 84.0 09/20/2017   HDL 63.20 09/20/2017   LDLCALC 121 (H) 09/20/2017   ALT 16 01/28/2020   AST 17 01/28/2020   NA 138 12/15/2020   K 3.8 12/15/2020   CL 97 12/15/2020   CREATININE 0.60 12/15/2020   BUN 15 12/15/2020   CO2 33 (H) 12/15/2020   TSH 3.14 09/20/2017    Lab Results  Component Value Date   TSH 3.14 09/20/2017   Lab Results  Component Value Date   WBC 7.1 12/08/2020   HGB 11.4 (L) 12/08/2020   HCT 34.7 (L) 12/08/2020   MCV 79.0 12/08/2020   PLT 305.0 12/08/2020   Lab Results  Component Value Date   NA 138 12/15/2020   K 3.8 12/15/2020   CO2 33 (H) 12/15/2020   GLUCOSE 84 12/15/2020   BUN 15 12/15/2020   CREATININE 0.60 12/15/2020   BILITOT 0.3 01/28/2020   ALKPHOS 61 01/28/2020   AST 17 01/28/2020   ALT 16 01/28/2020   PROT 7.5 01/28/2020   ALBUMIN 3.7 01/28/2020   CALCIUM 9.3 12/15/2020   ANIONGAP 9 01/28/2020   GFR 99.91 12/15/2020   Lab Results  Component Value Date   CHOL 201 (H) 09/20/2017    Lab Results  Component Value Date   HDL 63.20 09/20/2017  Lab Results  Component Value Date   LDLCALC 121 (H) 09/20/2017   Lab Results  Component Value Date   TRIG 84.0 09/20/2017   Lab Results  Component Value Date   CHOLHDL 3 09/20/2017   No results found for: HGBA1C     Assessment & Plan:   Problem List Items Addressed This Visit   None     I am having Deneise Lever D. Melina Copa "[name]" maintain her ferrous sulfate, lisinopril, hydrochlorothiazide, potassium chloride, COVID-19 mRNA vaccine ([place]), and carvedilol.  No orders of the defined types were placed in this encounter.   I discussed the assessment and treatment plan with the patient. The patient was provided an opportunity to ask questions and all were answered. The patient agreed with the plan and demonstrated an understanding of the instructions.   The patient was advised to call back or seek an in-person evaluation if the symptoms worsen or if the condition fails to improve as anticipated.   [name], NP [employer] [facility] [phone] (phone) [phone] (fax)  [place]

## 2021-03-03 ENCOUNTER — Encounter: Payer: Self-pay | Admitting: Family

## 2021-03-03 NOTE — Telephone Encounter (Signed)
Please contact pt and see if we can get her scheduled for a virtual visit today with me or another provider.

## 2021-03-03 NOTE — Telephone Encounter (Signed)
Patient was advised unfortunately we do not have any openings here or at any of our [place] [facility] locations. Patient advised provider will like for her to be evaluated in person and to seek care at an urgent care today. She verbalized understanding.

## 2021-03-04 ENCOUNTER — Encounter: Payer: Self-pay | Admitting: Family

## 2021-03-04 ENCOUNTER — Encounter: Payer: Self-pay | Admitting: Family Medicine

## 2021-03-04 ENCOUNTER — Ambulatory Visit: Payer: BC Managed Care – PPO | Admitting: Family Medicine

## 2021-03-04 ENCOUNTER — Other Ambulatory Visit: Payer: Self-pay

## 2021-03-04 VITALS — BP 138/78 | HR 83 | Temp 98.3°F | Resp 18 | Ht 64.0 in | Wt 326.0 lb

## 2021-03-04 DIAGNOSIS — R062 Wheezing: Secondary | ICD-10-CM | POA: Diagnosis not present

## 2021-03-04 MED ORDER — ALBUTEROL SULFATE HFA 108 (90 BASE) MCG/ACT IN AERS: 2.0000 | INHALATION_SPRAY | Freq: Four times a day (QID) | RESPIRATORY_TRACT | 0 refills | Status: DC | PRN

## 2021-03-04 NOTE — Progress Notes (Signed)
[place] at [facility] [address], [place], [place] [address] [phone]  Date:  03/04/2021   Name:  Marissa Bowman   DOB:  22-Feb-1966   MRN:  [id number]  PCP:  [name], NP    Chief Complaint: Bronchitis (Cough, one week, congestion)   History of Present Illness:  Marissa Bowman is a 55 y.o. very pleasant female patient who presents with the following:  Here to discuss bronchitis- video visit with Melissa on 5/16-  Treated with azithromcyin -she is now taking 2 out of 5 days of treatment.  She has noted a cough which may bring up some mucus She has been coughing for about 10 days now  The cough is getting better No fever No chills Some aches No runny or stuffy nose, no ear ache No ST  No vomiting or diarrhea   Never a smoker  She did take a covid test and it was negative during illness   She does have a history of asthma as a child, has noted some wheezing during her current illness  Overall, [name] is noticing improvement and feeling better.  However, she does not feel quite ready to return to work.  She wonders if she might have return to work note for Monday  Patient Active Problem List   Diagnosis Date Noted  . Bronchitis 03/01/2021  . Polyp of sigmoid colon   . Polyp of transverse colon   . Special screening for malignant neoplasms, colon   . Anemia, iron deficiency 08/14/2014  . Preventative health care 08/08/2014  . HTN (hypertension) 08/06/2013  . Morbid obesity with BMI of 50.0-59.9, adult ([place]) 08/06/2013    Past Medical History:  Diagnosis Date  . Anemia    on iron  . Asthma    childhood  . Hypertension     Past Surgical History:  Procedure Laterality Date  . COLONOSCOPY    . COLONOSCOPY WITH PROPOFOL N/A 02/03/2017   Procedure: COLONOSCOPY WITH PROPOFOL;  Surgeon: Mauri Pole, MD;  Location: [facility];  Service: Endoscopy;  Laterality: N/A;  . NO PAST SURGERIES       Social History   Tobacco Use  . Smoking status: Never Smoker  . Smokeless tobacco: Never Used  Vaping Use  . Vaping Use: Never used  Substance Use Topics  . Alcohol use: No  . Drug use: No    Family History  Problem Relation Age of Onset  . Hypertension Mother   . Hyperlipidemia Mother   . Diabetes Mother   . Lung cancer Father   . Thyroid disease Sister   . Fibromyalgia Sister   . Colon cancer Neg Hx     Allergies  Allergen Reactions  . Latex     rash    Medication list has been reviewed and updated.  Current Outpatient Medications on File Prior to Visit  Medication Sig Dispense Refill  . azithromycin (ZITHROMAX) 250 MG tablet Take 2 tablets on day 1, then 1 tablet daily on days 2 through 5 6 tablet 0  . benzonatate (TESSALON) 100 MG capsule Take 1 capsule (100 mg total) by mouth 3 (three) times daily as needed. 20 capsule 0  . carvedilol (COREG) 6.25 MG tablet TAKE ONE TABLET BY MOUTH TWICE A DAY WITH A MEAL 60 tablet 3  . COVID-19 mRNA vaccine, Pfizer, 30 MCG/0.3ML injection INJECT AS DIRECTED .3 mL 0  . ferrous sulfate 325 (65 FE) MG tablet  Take 325 mg by mouth 2 (two) times daily with a meal.     . hydrochlorothiazide (HYDRODIURIL) 25 MG tablet Take 1 tablet (25 mg total) by mouth daily. 90 tablet 1  . lisinopril (ZESTRIL) 40 MG tablet Take 1 tablet (40 mg total) by mouth daily. 90 tablet 1  . potassium chloride (KLOR-CON) 10 MEQ tablet Take 1 tablet (10 mEq total) by mouth daily. 30 tablet 30   No current facility-administered medications on file prior to visit.    Review of Systems:  As per HPI- otherwise negative.   Physical Examination: Vitals:   03/04/21 1458  BP: 138/78  Pulse: 83  Resp: 18  Temp: 98.3 F (36.8 C)  SpO2: 95%   Vitals:   03/04/21 1458  Weight: (!) 326 lb (147.9 kg)  Height: 5\' 4"  (1.626 m)   Body mass index is 55.96 kg/m. Ideal Body Weight: Weight in (lb) to have BMI = 25: 145.3  GEN: no acute distress.  Morbid  obesity, otherwise looks well HEENT: Atraumatic, Normocephalic.   Bilateral TM wnl, oropharynx normal.  PEERL,EOMI.   Ears and Nose: No external deformity. CV: RRR, No M/G/R. No JVD. No thrill. No extra heart sounds. PULM: CTA B, no wheezes, crackles, rhonchi. No retractions. No resp. distress. No accessory muscle use. ABD: S, NT, ND, +BS. No rebound. No HSM. EXTR: No c/c/e PSYCH: Normally interactive. Conversant.    Assessment and Plan: Wheezing - Plan: albuterol (VENTOLIN HFA) 108 (90 Base) MCG/ACT inhaler  Seen today for follow-up from recent video visit for bronchitis.  Patient is currently taking azithromycin and notes improvement.  She declines a chest film today.  She does except prescription for albuterol inhaler to use as needed for wheezing I provided an out of work note to cover her until Monday She will let me know if not continue to improve  This visit occurred during the SARS-CoV-2 public health emergency.  Safety protocols were in place, including screening questions prior to the visit, additional usage of staff PPE, and extensive cleaning of exam room while observing appropriate contact time as indicated for disinfecting solutions.     Signed Lamar Blinks, MD

## 2021-03-04 NOTE — Telephone Encounter (Signed)
Patient reports she did not go to urgent care as advised yesterday, not feeling better. She had a negative covid home test. Dr. Lorelei Pont will see patient in person today at  pm.

## 2021-03-04 NOTE — Patient Instructions (Signed)
Good to see you today- please let me know if you are not feeling better in the next few days- Sooner if worse.  Albuterol inhaler to use as needed for any wheezing

## 2021-03-08 ENCOUNTER — Encounter: Payer: Self-pay | Admitting: Family

## 2021-03-08 NOTE — Telephone Encounter (Signed)
Please contact pt to schedule an office visit with me tomorrow.

## 2021-03-08 NOTE — Telephone Encounter (Signed)
Called patient a few times but no answer on cell, left detail message. Her mother answer home phone and said patient was "at the hospital"

## 2021-03-09 ENCOUNTER — Ambulatory Visit: Payer: BC Managed Care – PPO | Admitting: Family

## 2021-03-09 ENCOUNTER — Encounter: Payer: Self-pay | Admitting: Family

## 2021-03-09 ENCOUNTER — Other Ambulatory Visit: Payer: Self-pay

## 2021-03-09 VITALS — BP 134/70 | HR 66 | Temp 99.1°F | Resp 16 | Wt 321.0 lb

## 2021-03-09 DIAGNOSIS — D509 Iron deficiency anemia, unspecified: Secondary | ICD-10-CM | POA: Diagnosis not present

## 2021-03-09 DIAGNOSIS — J40 Bronchitis, not specified as acute or chronic: Secondary | ICD-10-CM | POA: Diagnosis not present

## 2021-03-09 DIAGNOSIS — R195 Other fecal abnormalities: Secondary | ICD-10-CM | POA: Diagnosis not present

## 2021-03-09 NOTE — Patient Instructions (Signed)
Please go to the ER if you see bright red blood in the stool or if you develop black tarry stools.

## 2021-03-09 NOTE — Progress Notes (Signed)
Subjective:    Patient ID: Marissa Bowman, female    DOB: [birth year], 55 y.o.   MRN: [id number]  HPI  Patient is a 55 yr old female who presents today for follow up of her bronchitis. We originally saw her on 5/16 and prescribed zpak. She was then seen on 5/19 in person and rx was provided for albuterol which she has not picked up "because I didn't need it." Yesterday she went to the [facility] regional ED and had a chest x-ray which showed central venous pulmonary congestion. ED gave her an rx for levaquin which she plans to pick up.   Her Hgb yesterday was noted to be low at 9.5 with a mild leukocytosis (13.6). She reports that she has had a lot of fatigue.   Lab Results  Component Value Date   WBC 7.1 12/08/2020   HGB 11.4 (L) 12/08/2020   HCT 34.7 (L) 12/08/2020   MCV 79.0 12/08/2020   PLT 305.0 12/08/2020    Review of Systems See HPI  Past Medical History:  Diagnosis Date  . Anemia    on iron  . Asthma    childhood  . Hypertension      Social History   Socioeconomic History  . Marital status: Single    Spouse name: Not on file  . Number of children: Not on file  . Years of education: Not on file  . Highest education level: Not on file  Occupational History  . Not on file  Tobacco Use  . Smoking status: Never Smoker  . Smokeless tobacco: Never Used  Vaping Use  . Vaping Use: Never used  Substance and Sexual Activity  . Alcohol use: No  . Drug use: No  . Sexual activity: Not on file  Other Topics Concern  . Not on file  Social History Narrative   1 son- age 68   Unemployed, plans to sub with the [employer]   Associated degree   Lives with mother father/father and son   Has 1 dog lab/spaniel mix   Enjoys reading   Social Determinants of Health   Financial Resource Strain: Not on file  Food Insecurity: Not on file  Transportation Needs: Not on file  Physical Activity: Not on file  Stress: Not on file  Social Connections: Not on file  Intimate Partner  Violence: Not on file    Past Surgical History:  Procedure Laterality Date  . COLONOSCOPY    . COLONOSCOPY WITH PROPOFOL N/A 02/03/2017   Procedure: COLONOSCOPY WITH PROPOFOL;  Surgeon: Mauri Pole, MD;  Location: [facility];  Service: Endoscopy;  Laterality: N/A;  . NO PAST SURGERIES      Family History  Problem Relation Age of Onset  . Hypertension Mother   . Hyperlipidemia Mother   . Diabetes Mother   . Lung cancer Father   . Thyroid disease Sister   . Fibromyalgia Sister   . Colon cancer Neg Hx     Allergies  Allergen Reactions  . Latex     rash    Current Outpatient Medications on File Prior to Visit  Medication Sig Dispense Refill  . albuterol (VENTOLIN HFA) 108 (90 Base) MCG/ACT inhaler Inhale 2 puffs into the lungs every 6 (six) hours as needed for wheezing or shortness of breath. 1 each 0  . benzonatate (TESSALON) 100 MG capsule Take 1 capsule (100 mg total) by mouth 3 (three) times daily as needed. 20 capsule 0  . carvedilol (COREG) 6.25 MG  tablet TAKE ONE TABLET BY MOUTH TWICE A DAY WITH A MEAL 60 tablet 3  . COVID-19 mRNA vaccine, Pfizer, 30 MCG/0.3ML injection INJECT AS DIRECTED .3 mL 0  . ferrous sulfate 325 (65 FE) MG tablet Take 325 mg by mouth 2 (two) times daily with a meal.     . hydrochlorothiazide (HYDRODIURIL) 25 MG tablet Take 1 tablet (25 mg total) by mouth daily. 90 tablet 1  . levofloxacin (LEVAQUIN) 750 MG tablet Take 750 mg by mouth daily.    Marland Kitchen lisinopril (ZESTRIL) 40 MG tablet Take 1 tablet (40 mg total) by mouth daily. 90 tablet 1  . potassium chloride (KLOR-CON) 10 MEQ tablet Take 1 tablet (10 mEq total) by mouth daily. 30 tablet 30   No current facility-administered medications on file prior to visit.    BP 134/70 (BP Location: Right Arm, Patient Position: Sitting, Cuff Size: Large)   Pulse 66   Temp 99.1 F (37.3 C) (Oral)   Resp 16   Wt (!) 321 lb (145.6 kg)   LMP 01/27/2017   SpO2 98%   BMI 55.10 kg/m       Objective:    Physical Exam Constitutional:      Appearance: She is well-developed.  Cardiovascular:     Rate and Rhythm: Normal rate and regular rhythm.     Heart sounds: Normal heart sounds. No murmur heard.   Pulmonary:     Effort: Pulmonary effort is normal. No respiratory distress.     Breath sounds: Normal breath sounds. No wheezing.  Genitourinary:    Rectum: Guaiac result positive. External hemorrhoid present. No mass.  Psychiatric:        Behavior: Behavior normal.        Thought Content: Thought content normal.        Judgment: Judgment normal.           Assessment & Plan:

## 2021-03-10 ENCOUNTER — Encounter: Payer: Self-pay | Admitting: Gastroenterology

## 2021-03-10 DIAGNOSIS — R195 Other fecal abnormalities: Secondary | ICD-10-CM

## 2021-03-10 HISTORY — DX: Other fecal abnormalities: R19.5

## 2021-03-10 NOTE — Assessment & Plan Note (Signed)
Heme + stool.  Will refer to gastroenterology.  Likely contributing to her fatigue. Continue iron supplement.

## 2021-03-10 NOTE — Assessment & Plan Note (Signed)
Stable, she will begin Levaquin and albuterol prn.

## 2021-03-24 ENCOUNTER — Telehealth: Payer: Self-pay

## 2021-03-24 NOTE — Telephone Encounter (Signed)
Called the patient. She had sent a patient advise request Through MY Chart.  She is seeing blood when she wipes after a bowel movement. She was referred by her PCP for hem positive stools. IDA. Hemorrhoid external. Offered sooner appointment. She declines.  She states it is painless bleeding. She does not have rectal burning, itching or discomfort. She is keeping her stools soft with daily Miralax. She will use moist wipes, and sitz bathes. She can use OTC hemorrhoid preparation.  Her recall colon is 01/2022. Last colonoscopy was 01/2017. Does she need a direct procedure?

## 2021-03-24 NOTE — Telephone Encounter (Signed)
She needs to be seen in office, may need EGD along with colonoscopy. Will need a visit so we can appropriately evaluate the iron def anemia. Thanks

## 2021-03-25 NOTE — Telephone Encounter (Signed)
Called the patient. No answer. Left her the information. Added her to the cancellation list in case of an earlier opening.

## 2021-03-29 DIAGNOSIS — R011 Cardiac murmur, unspecified: Secondary | ICD-10-CM | POA: Insufficient documentation

## 2021-04-16 ENCOUNTER — Ambulatory Visit: Payer: BC Managed Care – PPO | Attending: Internal Medicine

## 2021-04-16 ENCOUNTER — Ambulatory Visit: Payer: BC Managed Care – PPO | Admitting: Family

## 2021-04-16 ENCOUNTER — Other Ambulatory Visit: Payer: Self-pay

## 2021-04-16 VITALS — BP 163/79 | HR 60 | Temp 98.5°F | Resp 16 | Ht 64.0 in | Wt 325.0 lb

## 2021-04-16 DIAGNOSIS — Z0000 Encounter for general adult medical examination without abnormal findings: Secondary | ICD-10-CM

## 2021-04-16 DIAGNOSIS — R195 Other fecal abnormalities: Secondary | ICD-10-CM | POA: Diagnosis not present

## 2021-04-16 DIAGNOSIS — R739 Hyperglycemia, unspecified: Secondary | ICD-10-CM | POA: Diagnosis not present

## 2021-04-16 DIAGNOSIS — I10 Essential (primary) hypertension: Secondary | ICD-10-CM

## 2021-04-16 DIAGNOSIS — D509 Iron deficiency anemia, unspecified: Secondary | ICD-10-CM | POA: Diagnosis not present

## 2021-04-16 DIAGNOSIS — Z23 Encounter for immunization: Secondary | ICD-10-CM

## 2021-04-16 LAB — COMPREHENSIVE METABOLIC PANEL
ALT: 10 U/L (ref 0–35)
AST: 11 U/L (ref 0–37)
Albumin: 3.8 g/dL (ref 3.5–5.2)
Alkaline Phosphatase: 61 U/L (ref 39–117)
BUN: 10 mg/dL (ref 6–23)
CO2: 31 mEq/L (ref 19–32)
Calcium: 8.8 mg/dL (ref 8.4–10.5)
Chloride: 103 mEq/L (ref 96–112)
Creatinine, Ser: 0.69 mg/dL (ref 0.40–1.20)
GFR: 96.37 mL/min (ref >60.00)
Glucose, Bld: 117 mg/dL — ABNORMAL HIGH (ref 70–99)
Potassium: 3.7 mEq/L (ref 3.5–5.1)
Sodium: 141 mEq/L (ref 135–145)
Total Bilirubin: 0.4 mg/dL (ref 0.2–1.2)
Total Protein: 7 g/dL (ref 6.0–8.3)

## 2021-04-16 LAB — CBC WITH DIFFERENTIAL/PLATELET
Basophils Absolute: 0 10*3/uL (ref 0.0–0.1)
Basophils Relative: 0.4 % (ref 0.0–3.0)
Eosinophils Absolute: 0.3 10*3/uL (ref 0.0–0.7)
Eosinophils Relative: 4 % (ref 0.0–5.0)
HCT: 31.6 % — ABNORMAL LOW (ref 36.0–46.0)
Hemoglobin: 10.2 g/dL — ABNORMAL LOW (ref 12.0–15.0)
Lymphocytes Relative: 18 % (ref 12.0–46.0)
Lymphs Abs: 1.3 10*3/uL (ref 0.7–4.0)
MCHC: 32.3 g/dL (ref 30.0–36.0)
MCV: 78.2 fl (ref 78.0–100.0)
Monocytes Absolute: 0.4 10*3/uL (ref 0.1–1.0)
Monocytes Relative: 5.3 % (ref 3.0–12.0)
Neutro Abs: 5.1 10*3/uL (ref 1.4–7.7)
Neutrophils Relative %: 72.3 % (ref 43.0–77.0)
Platelets: 309 10*3/uL (ref 150.0–400.0)
RBC: 4.03 Mil/uL (ref 3.87–5.11)
RDW: 16.6 % — ABNORMAL HIGH (ref 11.5–15.5)
WBC: 7 10*3/uL (ref 4.0–10.5)

## 2021-04-16 LAB — HEMOGLOBIN A1C: Hgb A1c MFr Bld: 5.8 % (ref 4.6–6.5)

## 2021-04-16 LAB — IRON: Iron: 54 ug/dL (ref 42–145)

## 2021-04-16 LAB — FERRITIN: Ferritin: 235.7 ng/mL (ref 10.0–291.0)

## 2021-04-16 MED ORDER — HYDROCHLOROTHIAZIDE 25 MG PO TABS: 25.0000 mg | ORAL_TABLET | Freq: Every day | ORAL | 1 refills | Status: DC

## 2021-04-16 NOTE — Assessment & Plan Note (Signed)
Continue oral iron, check cbc, iron studies.

## 2021-04-16 NOTE — Progress Notes (Signed)
Subjective:     Patient ID: Marissa Bowman, female    DOB: March 25, 1966, 55 y.o.   MRN: [id number]  Chief Complaint  Patient presents with   Hypertension    Here for follow up   Anemia    History of iron deficiency    HPI Patient is in today for follow up.    Heme positive stool- last visit we referred her to GI.  She is scheduled to see Dr. Silverio Decamp on 06/03/21.  She reports fatigue is improved. She has not seen any blood in the stool. Does have some abdominal bloating.   Lab Results  Component Value Date   WBC 7.1 12/08/2020   HGB 11.4 (L) 12/08/2020   HCT 34.7 (L) 12/08/2020   MCV 79.0 12/08/2020   PLT 305.0 12/08/2020   HTN-  BP Readings from Last 3 Encounters:  04/16/21 (!) 163/79  03/09/21 134/70  03/04/21 138/78   BP medications includ lisinopril 41m, kdur 10 meq, coreg 6.263m She ran out of hctz and has not been taking.    Health Maintenance Due  Topic Date Due   PAP SMEAR-Modifier  09/20/2020   COVID-19 Vaccine (4 - Booster for Pfizer series) 03/09/2021   MAMMOGRAM  03/23/2021    Past Medical History:  Diagnosis Date   Anemia    on iron   Asthma    childhood   Hypertension     Past Surgical History:  Procedure Laterality Date   COLONOSCOPY     COLONOSCOPY WITH PROPOFOL N/A 02/03/2017   Procedure: COLONOSCOPY WITH PROPOFOL;  Surgeon: KaMauri PoleMD;  Location: [facility];  Service: Endoscopy;  Laterality: N/A;   NO PAST SURGERIES      Family History  Problem Relation Age of Onset   Hypertension Mother    Hyperlipidemia Mother    Diabetes Mother    Lung cancer Father    Thyroid disease Sister    Fibromyalgia Sister    Colon cancer Neg Hx     Social History   Socioeconomic History   Marital status: Single    Spouse name: Not on file   Number of children: Not on file   Years of education: Not on file   Highest education level: Not on file  Occupational History   Not on file  Tobacco Use   Smoking status: Never    Smokeless tobacco: Never  Vaping Use   Vaping Use: Never used  Substance and Sexual Activity   Alcohol use: No   Drug use: No   Sexual activity: Not on file  Other Topics Concern   Not on file  Social History Narrative   1 son- age 57 Unemployed, plans to sub with the [employer]   Associated degree   Lives with mother faHospital doctornd son   Has 1 dog lab/spaniel mix   Enjoys reading   Social Determinants of Health   Financial Resource Strain: Not on file  Food Insecurity: Not on file  Transportation Needs: Not on file  Physical Activity: Not on file  Stress: Not on file  Social Connections: Not on file  Intimate Partner Violence: Not on file    Outpatient Medications Prior to Visit  Medication Sig Dispense Refill   albuterol (VENTOLIN HFA) 108 (90 Base) MCG/ACT inhaler Inhale 2 puffs into the lungs every 6 (six) hours as needed for wheezing or shortness of breath. 1 each 0   carvedilol (COREG) 6.25 MG tablet TAKE ONE TABLET BY MOUTH TWICE  A DAY WITH A MEAL 60 tablet 3   COVID-19 mRNA vaccine, Pfizer, 30 MCG/0.3ML injection INJECT AS DIRECTED .3 mL 0   ferrous sulfate 325 (65 FE) MG tablet Take 325 mg by mouth 2 (two) times daily with a meal.      lisinopril (ZESTRIL) 40 MG tablet Take 1 tablet (40 mg total) by mouth daily. 90 tablet 1   potassium chloride (KLOR-CON) 10 MEQ tablet Take 1 tablet (10 mEq total) by mouth daily. 30 tablet 30   hydrochlorothiazide (HYDRODIURIL) 25 MG tablet Take 1 tablet (25 mg total) by mouth daily. 90 tablet 1   benzonatate (TESSALON) 100 MG capsule Take 1 capsule (100 mg total) by mouth 3 (three) times daily as needed. 20 capsule 0   levofloxacin (LEVAQUIN) 750 MG tablet Take 750 mg by mouth daily.     No facility-administered medications prior to visit.    Allergies  Allergen Reactions   Latex     rash    ROS    See HPI Objective:    Physical Exam Constitutional:      Appearance: She is well-developed.  Cardiovascular:      Rate and Rhythm: Normal rate and regular rhythm.     Heart sounds: Normal heart sounds. No murmur heard. Pulmonary:     Effort: Pulmonary effort is normal. No respiratory distress.     Breath sounds: Normal breath sounds. No wheezing.  Psychiatric:        Behavior: Behavior normal.        Thought Content: Thought content normal.        Judgment: Judgment normal.    BP (!) 163/79 (BP Location: Right Arm, Patient Position: Sitting, Cuff Size: Large)   Pulse 60   Temp 98.5 F (36.9 C) (Oral)   Resp 16   Ht 5' 4"  (1.626 m)   Wt (!) 325 lb (147.4 kg)   LMP 01/27/2017   SpO2 100%   BMI 55.79 kg/m  Wt Readings from Last 3 Encounters:  04/16/21 (!) 325 lb (147.4 kg)  03/09/21 (!) 321 lb (145.6 kg)  03/04/21 (!) 326 lb (147.9 kg)       Assessment & Plan:   Problem List Items Addressed This Visit       Unprioritized   Preventative health care   Relevant Orders   MM 3D SCREEN BREAST BILATERAL   HTN (hypertension)    Uncontrolled. Restart hctz 75m once daily.        Relevant Medications   hydrochlorothiazide (HYDRODIURIL) 25 MG tablet   Other Relevant Orders   Comp Met (CMET)   Heme positive stool    Repeat CBC, await GI consult.        Anemia, iron deficiency - Primary    Continue oral iron, check cbc, iron studies.       Relevant Orders   CBC with Differential/Platelet   Iron   Ferritin   Iron Binding Cap (TIBC)([facility])   Other Visit Diagnoses     Hyperglycemia       Relevant Orders   Hemoglobin A1c       I have discontinued AJoelene Millin Harlan "Anne"'s benzonatate and levofloxacin. I am also having her maintain her ferrous sulfate, lisinopril, potassium chloride, COVID-19 mRNA vaccine (Pfizer), carvedilol, albuterol, and hydrochlorothiazide.  Meds ordered this encounter  Medications   hydrochlorothiazide (HYDRODIURIL) 25 MG tablet    Sig: Take 1 tablet (25 mg total) by mouth daily.    Dispense:  90 tablet    Refill:  1    Order Specific  Question:   Supervising Provider    Answer:   Penni Homans A [id number]

## 2021-04-16 NOTE — Assessment & Plan Note (Signed)
Uncontrolled. Restart hctz 25mg  once daily.

## 2021-04-16 NOTE — Progress Notes (Signed)
   Covid-19 Vaccination Clinic  Name:  Marissa Bowman    MRN: [id number] DOB: [birth year]  04/16/2021  Marissa Bowman was observed post Covid-19 immunization for 15 minutes without incident. She was provided with Vaccine Information Sheet and instruction to access the V-Safe system.   Marissa Bowman was instructed to call 911 with any severe reactions post vaccine: Difficulty breathing  Swelling of face and throat  A fast heartbeat  A bad rash all over body  Dizziness and weakness   Immunizations Administered     Name Date Dose VIS Date Route   PFIZER Comrnaty(Gray TOP) Covid-19 Vaccine 04/16/2021  9:40 AM 0.3 mL 09/24/2020 Intramuscular   Manufacturer: [place]   Lot: [id number]   [place]: [phone]

## 2021-04-16 NOTE — Assessment & Plan Note (Signed)
Repeat CBC, await GI consult.

## 2021-04-16 NOTE — Patient Instructions (Signed)
Please get your second covid booster. Please schedule your mammogram on the first floor.

## 2021-04-17 LAB — IRON AND TIBC
Iron Saturation: 23 % (ref 15–55)
Iron: 51 ug/dL (ref 27–159)
Total Iron Binding Capacity: 222 ug/dL — ABNORMAL LOW (ref 250–450)
UIBC: 171 ug/dL (ref 131–425)

## 2021-04-20 ENCOUNTER — Other Ambulatory Visit (HOSPITAL_BASED_OUTPATIENT_CLINIC_OR_DEPARTMENT_OTHER): Payer: Self-pay

## 2021-04-20 MED ORDER — COVID-19 MRNA VAC-TRIS(PFIZER) 30 MCG/0.3ML IM SUSP
INTRAMUSCULAR | 0 refills | Status: DC
  Filled 2021-04-20: qty 0.3, 1d supply, fill #0

## 2021-04-22 ENCOUNTER — Encounter: Payer: Self-pay | Admitting: Family

## 2021-04-22 NOTE — Telephone Encounter (Signed)
Patient was scheduled to be seen tomorrow at 9:40 with Beverely Risen.

## 2021-04-23 ENCOUNTER — Other Ambulatory Visit: Payer: Self-pay

## 2021-04-23 ENCOUNTER — Ambulatory Visit: Payer: BC Managed Care – PPO | Admitting: Family

## 2021-04-23 VITALS — BP 147/76 | HR 72 | Temp 98.0°F | Ht 64.0 in | Wt 325.6 lb

## 2021-04-23 DIAGNOSIS — L259 Unspecified contact dermatitis, unspecified cause: Secondary | ICD-10-CM

## 2021-04-23 MED ORDER — METHYLPREDNISOLONE ACETATE 80 MG/ML IJ SUSP
80.0000 mg | Freq: Once | INTRAMUSCULAR | Status: AC
  Administered 2021-04-23: 80 mg via INTRAMUSCULAR

## 2021-04-23 MED ORDER — PREDNISONE 10 MG (21) PO TBPK: ORAL_TABLET | ORAL | 0 refills | Status: DC

## 2021-04-23 MED ORDER — HYDROXYZINE PAMOATE 25 MG PO CAPS: 25.0000 mg | ORAL_CAPSULE | Freq: Three times a day (TID) | ORAL | 0 refills | Status: DC | PRN

## 2021-04-23 MED ORDER — FAMOTIDINE 20 MG PO TABS: 20.0000 mg | ORAL_TABLET | Freq: Two times a day (BID) | ORAL | 0 refills | Status: DC

## 2021-04-23 NOTE — Progress Notes (Signed)
Marissa Bowman is a 55 y.o. female with the following history as recorded in EpicCare:  Patient Active Problem List   Diagnosis Date Noted   Heme positive stool 03/10/2021   Bronchitis 03/01/2021   Polyp of sigmoid colon    Polyp of transverse colon    Special screening for malignant neoplasms, colon    Anemia, iron deficiency 08/14/2014   Preventative health care 08/08/2014   HTN (hypertension) 08/06/2013   Morbid obesity with BMI of 50.0-59.9, adult ([place]) 08/06/2013    Current Outpatient Medications  Medication Sig Dispense Refill   carvedilol (COREG) 6.25 MG tablet TAKE ONE TABLET BY MOUTH TWICE A DAY WITH A MEAL 60 tablet 3   famotidine (PEPCID) 20 MG tablet Take 1 tablet (20 mg total) by mouth 2 (two) times daily. 40 tablet 0   ferrous sulfate 325 (65 FE) MG tablet Take 325 mg by mouth 2 (two) times daily with a meal.      hydrochlorothiazide (HYDRODIURIL) 25 MG tablet Take 1 tablet (25 mg total) by mouth daily. 90 tablet 1   hydrOXYzine (VISTARIL) 25 MG capsule Take 1 capsule (25 mg total) by mouth every 8 (eight) hours as needed for itching. 30 capsule 0   lisinopril (ZESTRIL) 40 MG tablet Take 1 tablet (40 mg total) by mouth daily. 90 tablet 1   potassium chloride (KLOR-CON) 10 MEQ tablet Take 1 tablet (10 mEq total) by mouth daily. 30 tablet 30   predniSONE (STERAPRED UNI-PAK 21 TAB) 10 MG (21) TBPK tablet Taper as directed 21 tablet 0   No current facility-administered medications for this visit.    Allergies: Latex  Past Medical History:  Diagnosis Date   Anemia    on iron   Asthma    childhood   Hypertension     Past Surgical History:  Procedure Laterality Date   COLONOSCOPY     COLONOSCOPY WITH PROPOFOL N/A 02/03/2017   Procedure: COLONOSCOPY WITH PROPOFOL;  Surgeon: Mauri Pole, MD;  Location: [facility];  Service: Endoscopy;  Laterality: N/A;   NO PAST SURGERIES      Family History  Problem Relation Age of Onset   Hypertension Mother     Hyperlipidemia Mother    Diabetes Mother    Lung cancer Father    Thyroid disease Sister    Fibromyalgia Sister    Colon cancer Neg Hx     Social History   Tobacco Use   Smoking status: Never   Smokeless tobacco: Never  Substance Use Topics   Alcohol use: No    Subjective:  Concerned for reaction to poison ivy; notes her brother was working in the yard and she is suspicious this is how she got exposed;  Rash and swelling around left eye/ left cheek- can open eye slightly and see out of the eye; rash is spreading onto both arms as well.    Objective:  Vitals:   04/23/21 1009  BP: (!) 147/76  Pulse: 72  Temp: 98 F (36.7 C)  TempSrc: Oral  SpO2: 98%  Weight: (!) 325 lb 9.6 oz (147.7 kg)  Height: 5\' 4"  (1.626 m)    General: Well developed, well nourished, in no acute distress  Skin : Warm and dry. Marked swelling and rash noted around left eye; vesicular lesions noted on forearms;  Head: Normocephalic and atraumatic  Lungs: Respirations unlabored; clear to auscultation bilaterally without wheeze, rales, rhonchi  CVS exam: normal rate, regular rhythm, normal S1, S2, no murmurs, rubs, clicks or  gallops.  Neurologic: Alert and oriented; speech intact; face symmetrical; moves all extremities well; CNII-XII intact without focal deficit   Assessment:  1. Contact dermatitis, unspecified contact dermatitis type, unspecified trigger     Plan:  Depo-Medrol IM 80 mg given; start Hydroxyzine and Pepcid for anti-histamine benefit; start oral prednisone tomorrow; plan to follow up on Monday if no improvement and will consider a repeat steroid injection;  This visit occurred during the SARS-CoV-2 public health emergency.  Safety protocols were in place, including screening questions prior to the visit, additional usage of staff PPE, and extensive cleaning of exam room while observing appropriate contact time as indicated for disinfecting solutions.    No follow-ups on file.  No orders  of the defined types were placed in this encounter.   Requested Prescriptions   Signed Prescriptions Disp Refills   predniSONE (STERAPRED UNI-PAK 21 TAB) 10 MG (21) TBPK tablet 21 tablet 0    Sig: Taper as directed   hydrOXYzine (VISTARIL) 25 MG capsule 30 capsule 0    Sig: Take 1 capsule (25 mg total) by mouth every 8 (eight) hours as needed for itching.   famotidine (PEPCID) 20 MG tablet 40 tablet 0    Sig: Take 1 tablet (20 mg total) by mouth 2 (two) times daily.

## 2021-05-24 ENCOUNTER — Ambulatory Visit (HOSPITAL_BASED_OUTPATIENT_CLINIC_OR_DEPARTMENT_OTHER): Payer: BC Managed Care – PPO

## 2021-05-29 ENCOUNTER — Other Ambulatory Visit: Payer: Self-pay | Admitting: Family

## 2021-05-31 ENCOUNTER — Encounter: Payer: BC Managed Care – PPO | Admitting: Family

## 2021-05-31 ENCOUNTER — Ambulatory Visit (HOSPITAL_BASED_OUTPATIENT_CLINIC_OR_DEPARTMENT_OTHER): Payer: BC Managed Care – PPO

## 2021-05-31 ENCOUNTER — Encounter: Payer: Self-pay | Admitting: Family

## 2021-06-03 ENCOUNTER — Ambulatory Visit (INDEPENDENT_AMBULATORY_CARE_PROVIDER_SITE_OTHER): Payer: BC Managed Care – PPO | Admitting: Gastroenterology

## 2021-06-03 ENCOUNTER — Encounter: Payer: Self-pay | Admitting: Gastroenterology

## 2021-06-03 VITALS — BP 120/60 | HR 62 | Ht 64.0 in | Wt 322.5 lb

## 2021-06-03 DIAGNOSIS — D509 Iron deficiency anemia, unspecified: Secondary | ICD-10-CM

## 2021-06-03 DIAGNOSIS — K921 Melena: Secondary | ICD-10-CM

## 2021-06-03 DIAGNOSIS — Z6841 Body Mass Index (BMI) 40.0 and over, adult: Secondary | ICD-10-CM | POA: Diagnosis not present

## 2021-06-03 NOTE — Progress Notes (Signed)
Marissa Bowman    [id number]    October 01, 1966  Primary Care Physician:O'Sullivan, Lenna Sciara, NP  Referring Physician: [name], NP [address] [place],  [place] [address]   Chief complaint: Iron deficiency, heme positive stool  HPI:  55 year old very pleasant female here for new patient visit for evaluation of heme positive stool She has been taking iron tablets twice daily for many years now.  Denies any rectal bleeding but has history of intermittent dark tarry stool even before she was taking iron tablets.  She does not remember when she was started on iron tablets but thinks its been few years now. She is not having any menstrual bleeding, last time she had menstrual blood loss was 2 years ago. No family history of GI malignancy Denies any nausea, vomiting, abdominal pain, or bright red blood per rectum    Outpatient Encounter Medications as of 06/03/2021  Medication Sig   carvedilol (COREG) 6.25 MG tablet TAKE ONE TABLET BY MOUTH TWICE A DAY WITH A MEAL   ferrous sulfate 325 (65 FE) MG tablet Take 325 mg by mouth 2 (two) times daily with a meal.    hydrochlorothiazide (HYDRODIURIL) 25 MG tablet Take 1 tablet (25 mg total) by mouth daily.   lisinopril (ZESTRIL) 40 MG tablet TAKE ONE TABLET BY MOUTH ONE TIME DAILY   potassium chloride (KLOR-CON) 10 MEQ tablet Take 1 tablet (10 mEq total) by mouth daily.   [DISCONTINUED] famotidine (PEPCID) 20 MG tablet Take 1 tablet (20 mg total) by mouth 2 (two) times daily. (Patient not taking: Reported on 06/03/2021)   [DISCONTINUED] hydrOXYzine (VISTARIL) 25 MG capsule Take 1 capsule (25 mg total) by mouth every 8 (eight) hours as needed for itching.   [DISCONTINUED] predniSONE (STERAPRED UNI-PAK 21 TAB) 10 MG (21) TBPK tablet Taper as directed   No facility-administered encounter medications on file as of 06/03/2021.    Allergies as of 06/03/2021 - Review Complete 06/03/2021  Allergen Reaction Noted    Latex  11/08/2016    Past Medical History:  Diagnosis Date   Anemia    on iron   Asthma    childhood   Hypertension     Past Surgical History:  Procedure Laterality Date   COLONOSCOPY     COLONOSCOPY WITH PROPOFOL N/A 02/03/2017   Procedure: COLONOSCOPY WITH PROPOFOL;  Surgeon: Mauri Pole, MD;  Location: [facility];  Service: Endoscopy;  Laterality: N/A;   NO PAST SURGERIES      Family History  Problem Relation Age of Onset   Hypertension Mother    Hyperlipidemia Mother    Diabetes Mother    Lung cancer Father    Thyroid disease Sister    Fibromyalgia Sister    Colon cancer Neg Hx     Social History   Socioeconomic History   Marital status: Single    Spouse name: Not on file   Number of children: Not on file   Years of education: Not on file   Highest education level: Not on file  Occupational History   Not on file  Tobacco Use   Smoking status: Never   Smokeless tobacco: Never  Vaping Use   Vaping Use: Never used  Substance and Sexual Activity   Alcohol use: No   Drug use: No   Sexual activity: Not on file  Other Topics Concern   Not on file  Social History Narrative   1 son- age  52   Unemployed, plans to sub with the [employer]   Associated degree   Lives with mother father/father and son   Has 1 dog lab/spaniel mix   Enjoys reading   Social Determinants of Health   Financial Resource Strain: Not on file  Food Insecurity: Not on file  Transportation Needs: Not on file  Physical Activity: Not on file  Stress: Not on file  Social Connections: Not on file  Intimate Partner Violence: Not on file      Review of systems: All other review of systems negative except as mentioned in the HPI.   Physical Exam: Vitals:   06/03/21 1000  BP: 120/60  Pulse: 62   Body mass index is 55.36 kg/m. Gen:      No acute distress HEENT:  sclera anicteric Abd:      soft, non-tender; no palpable masses, no distension Ext:    No edema Neuro:  alert and oriented x 3 Psych: normal mood and affect  Data Reviewed:  Reviewed labs, radiology imaging, old records and pertinent past GI work up   Assessment and Plan/Recommendations:  55 year old very pleasant female with BMI >50 with chronic iron deficiency and heme positive stool Chronic iron deficiency anemia secondary to GI blood loss We will plan for EGD and colonoscopy for further evaluation  The risks and benefits as well as alternatives of endoscopic procedure(s) have been discussed and reviewed. All questions answered. The patient agrees to proceed.  Advised patient to hold iron tablets for 1 week prior to the procedure  We will plan to schedule EGD and colonoscopy at [facility] endoscopy unit given BMI 55   The patient was provided an opportunity to ask questions and all were answered. The patient agreed with the plan and demonstrated an understanding of the instructions.  Damaris Hippo , MD    CC: [name], NP

## 2021-06-03 NOTE — Patient Instructions (Signed)
Your hospital procedure has been scheduled at [facility] endoscopy and separate instructions have been given  \Due to recent changes in healthcare laws, you may see the results of your imaging and laboratory studies on MyChart before your provider has had a chance to review them.  We understand that in some cases there may be results that are confusing or concerning to you. Not all laboratory results come back in the same time frame and the provider may be waiting for multiple results in order to interpret others.  Please give [place] 48 hours in order for your provider to thoroughly review all the results before contacting the office for clarification of your results.    If you are age 84 or older, your body mass index should be between 23-30. Your Body mass index is 55.36 kg/m. If this is out of the aforementioned range listed, please consider follow up with your Primary Care Provider.  If you are age 86 or younger, your body mass index should be between 19-25. Your Body mass index is 55.36 kg/m. If this is out of the aformentioned range listed, please consider follow up with your Primary Care Provider.   __________________________________________________________  The [facility] providers would like to encourage you to use [facility] to communicate with providers for non-urgent requests or questions.  Due to long hold times on the telephone, sending your provider a message by [facility] may be a faster and more efficient way to get a response.  Please allow 48 business hours for a response.  Please remember that this is for non-urgent requests.    Thank you for choosing [facility]  Karleen Hampshire Nandigam,MD

## 2021-06-04 ENCOUNTER — Encounter: Payer: BC Managed Care – PPO | Admitting: Family

## 2021-06-15 ENCOUNTER — Ambulatory Visit (INDEPENDENT_AMBULATORY_CARE_PROVIDER_SITE_OTHER): Payer: BC Managed Care – PPO | Admitting: Family

## 2021-06-15 ENCOUNTER — Encounter: Payer: Self-pay | Admitting: Family

## 2021-06-15 ENCOUNTER — Other Ambulatory Visit: Payer: Self-pay

## 2021-06-15 VITALS — BP 122/60 | HR 71 | Temp 98.1°F | Resp 16 | Ht 64.0 in | Wt 322.0 lb

## 2021-06-15 DIAGNOSIS — Z0000 Encounter for general adult medical examination without abnormal findings: Secondary | ICD-10-CM | POA: Diagnosis not present

## 2021-06-15 DIAGNOSIS — Z23 Encounter for immunization: Secondary | ICD-10-CM | POA: Diagnosis not present

## 2021-06-15 NOTE — Progress Notes (Signed)
Subjective:     Patient ID: Marissa Bowman, female    DOB: 11-08-1965, 55 y.o.   MRN: [id number]  Chief Complaint  Patient presents with   Annual Exam    HPI Patient is in today for CPX.   Immunizations: pfizer x 4.  Tetanus 2014.  Shingrix x 2. Would like a flu shot today Diet: She plans to change her diet.   Wt Readings from Last 3 Encounters:  06/15/21 (!) 322 lb (146.1 kg)  06/03/21 (!) 322 lb 8 oz (146.3 kg)  04/23/21 (!) 325 lb 9.6 oz (147.7 kg)   Breakfast- sometimes skips, sometimes a smoothie- banana, flax, 2% milk Lunch- sometimes she skips.  Sometimes eats cafeteria food.  Sometimes she gets a salad after work. No snacking Drinks water Dinner- burrito (homemade, rice tomatoes, chicken, cilantro, sour cream, black beans) No PM snacks Exercise: walks 30 minutes 3 days a week Colonoscopy: scheduled Pap Smear: declines pap today Mammogram: scheduled   Health Maintenance Due  Topic Date Due   PAP SMEAR-Modifier  09/20/2020   MAMMOGRAM  03/23/2021    Past Medical History:  Diagnosis Date   Anemia    on iron   Asthma    childhood   Hypertension     Past Surgical History:  Procedure Laterality Date   COLONOSCOPY     COLONOSCOPY WITH PROPOFOL N/A 02/03/2017   Procedure: COLONOSCOPY WITH PROPOFOL;  Surgeon: Mauri Pole, MD;  Location: [facility];  Service: Endoscopy;  Laterality: N/A;   NO PAST SURGERIES      Family History  Problem Relation Age of Onset   Hypertension Mother    Hyperlipidemia Mother    Diabetes Mother    Lung cancer Father    Thyroid disease Sister    Fibromyalgia Sister    Colon cancer Neg Hx     Social History   Socioeconomic History   Marital status: Single    Spouse name: Not on file   Number of children: Not on file   Years of education: Not on file   Highest education level: Not on file  Occupational History   Not on file  Tobacco Use   Smoking status: Never   Smokeless tobacco: Never  Vaping Use    Vaping Use: Never used  Substance and Sexual Activity   Alcohol use: No   Drug use: No   Sexual activity: Not on file  Other Topics Concern   Not on file  Social History Narrative   1 son- age 25   Unemployed, plans to sub with the [employer]   Associated degree   Lives with mother Hospital doctor and son   Has 1 dog lab/spaniel mix   Enjoys reading   Social Determinants of Health   Financial Resource Strain: Not on file  Food Insecurity: Not on file  Transportation Needs: Not on file  Physical Activity: Not on file  Stress: Not on file  Social Connections: Not on file  Intimate Partner Violence: Not on file    Outpatient Medications Prior to Visit  Medication Sig Dispense Refill   carvedilol (COREG) 6.25 MG tablet TAKE ONE TABLET BY MOUTH TWICE A DAY WITH A MEAL 60 tablet 3   ferrous sulfate 325 (65 FE) MG tablet Take 325 mg by mouth 2 (two) times daily with a meal.      hydrochlorothiazide (HYDRODIURIL) 25 MG tablet Take 1 tablet (25 mg total) by mouth daily. 90 tablet 1   lisinopril (ZESTRIL) 40 MG  tablet TAKE ONE TABLET BY MOUTH ONE TIME DAILY 90 tablet 1   potassium chloride (KLOR-CON) 10 MEQ tablet Take 1 tablet (10 mEq total) by mouth daily. 30 tablet 30   No facility-administered medications prior to visit.    Allergies  Allergen Reactions   Latex     rash    Review of Systems  Constitutional:  Negative for weight loss.  HENT:  Negative for congestion and hearing loss.   Eyes:  Negative for blurred vision.  Respiratory:  Negative for cough.   Cardiovascular:  Negative for leg swelling.  Gastrointestinal:  Positive for constipation.  Genitourinary:  Negative for dysuria and frequency.  Musculoskeletal:  Positive for back pain (back and neck). Negative for joint pain and myalgias.  Neurological:  Negative for headaches.  Psychiatric/Behavioral:         Denies depression/anxiety      Objective:    Physical Exam Constitutional:      General: She is not  in acute distress.    Appearance: Normal appearance. She is well-developed.  HENT:     Head: Normocephalic and atraumatic.     Right Ear: External ear normal.     Left Ear: External ear normal.  Eyes:     General: No scleral icterus. Neck:     Thyroid: No thyromegaly.  Cardiovascular:     Rate and Rhythm: Normal rate and regular rhythm.     Heart sounds: Normal heart sounds. No murmur heard. Pulmonary:     Effort: Pulmonary effort is normal. No respiratory distress.     Breath sounds: Normal breath sounds. No wheezing.  Musculoskeletal:     Cervical back: Neck supple.  Skin:    General: Skin is warm and dry.  Neurological:     Mental Status: She is alert and oriented to person, place, and time.  Psychiatric:        Mood and Affect: Mood normal.        Behavior: Behavior normal.        Thought Content: Thought content normal.        Judgment: Judgment normal.    BP 122/60 (BP Location: Right Arm, Patient Position: Sitting, Cuff Size: Large)   Pulse 71   Temp 98.1 F (36.7 C) (Oral)   Resp 16   Ht '5\' 4"'$  (1.626 m)   Wt (!) 322 lb (146.1 kg)   LMP 01/27/2017   SpO2 99%   BMI 55.27 kg/m  Wt Readings from Last 3 Encounters:  06/15/21 (!) 322 lb (146.1 kg)  06/03/21 (!) 322 lb 8 oz (146.3 kg)  04/23/21 (!) 325 lb 9.6 oz (147.7 kg)       Assessment & Plan:   Problem List Items Addressed This Visit       Unprioritized   Preventative health care    Discussed healthy diet, exercise and weight loss. Colo/endo scheduled. Flu shot today. Mammo scheduled. Declines pap smear.       Other Visit Diagnoses     Needs flu shot    -  Primary   Relevant Orders   Flu Vaccine QUAD 6+ mos PF IM (Fluarix Quad PF) (Completed)       I am having Marissa Bowman "Marissa Bowman" maintain her ferrous sulfate, potassium chloride, carvedilol, hydrochlorothiazide, and lisinopril.  No orders of the defined types were placed in this encounter.

## 2021-06-15 NOTE — Assessment & Plan Note (Addendum)
Discussed healthy diet, exercise and weight loss. Colo/endo scheduled. Flu shot today. Mammo scheduled. Declines pap smear.

## 2021-06-15 NOTE — Progress Notes (Signed)
Attempted to obtain medical history via telephone, unable to reach at this time. I left a voicemail to return pre surgical testing department's phone call.  

## 2021-06-19 ENCOUNTER — Other Ambulatory Visit: Payer: Self-pay | Admitting: Family

## 2021-06-22 ENCOUNTER — Encounter (HOSPITAL_COMMUNITY): Payer: Self-pay | Admitting: Anesthesiology

## 2021-06-22 ENCOUNTER — Ambulatory Visit (HOSPITAL_COMMUNITY)
Admission: RE | Admit: 2021-06-22 | Discharge: 2021-06-22 | Disposition: A | Discharge status: Home / Self Care | Payer: BC Managed Care – PPO | Source: Ambulatory Visit | Attending: Gastroenterology | Admitting: Gastroenterology

## 2021-06-22 ENCOUNTER — Encounter (HOSPITAL_COMMUNITY)
Admission: RE | Disposition: A | Discharge status: Home / Self Care | Payer: Self-pay | Source: Ambulatory Visit | Attending: Gastroenterology

## 2021-06-22 DIAGNOSIS — D509 Iron deficiency anemia, unspecified: Secondary | ICD-10-CM

## 2021-06-22 DIAGNOSIS — K921 Melena: Secondary | ICD-10-CM

## 2021-06-22 DIAGNOSIS — Z6841 Body Mass Index (BMI) 40.0 and over, adult: Secondary | ICD-10-CM

## 2021-06-22 SURGERY — CANCELLED PROCEDURE

## 2021-06-22 MED ORDER — SODIUM CHLORIDE 0.9 % IV SOLN: INTRAVENOUS | Status: DC

## 2021-06-22 SURGICAL SUPPLY — 25 items

## 2021-06-28 ENCOUNTER — Ambulatory Visit (HOSPITAL_BASED_OUTPATIENT_CLINIC_OR_DEPARTMENT_OTHER)
Admission: RE | Admit: 2021-06-28 | Discharge: 2021-06-28 | Disposition: A | Discharge status: Home / Self Care | Payer: BC Managed Care – PPO | Source: Ambulatory Visit | Attending: Family | Admitting: Family

## 2021-06-28 ENCOUNTER — Encounter (HOSPITAL_BASED_OUTPATIENT_CLINIC_OR_DEPARTMENT_OTHER): Payer: Self-pay

## 2021-06-28 ENCOUNTER — Other Ambulatory Visit: Payer: Self-pay

## 2021-06-28 DIAGNOSIS — Z0000 Encounter for general adult medical examination without abnormal findings: Secondary | ICD-10-CM | POA: Insufficient documentation

## 2021-06-28 DIAGNOSIS — Z1231 Encounter for screening mammogram for malignant neoplasm of breast: Secondary | ICD-10-CM | POA: Diagnosis not present

## 2021-06-30 ENCOUNTER — Other Ambulatory Visit: Payer: Self-pay

## 2021-06-30 DIAGNOSIS — Z6841 Body Mass Index (BMI) 40.0 and over, adult: Secondary | ICD-10-CM

## 2021-06-30 DIAGNOSIS — D509 Iron deficiency anemia, unspecified: Secondary | ICD-10-CM

## 2021-06-30 DIAGNOSIS — K921 Melena: Secondary | ICD-10-CM

## 2021-08-03 IMAGING — CT CT ANGIO NECK
1 of 11 series · 5 of 33 positions shown · IV contrast (Omnipaque)
Comparison: None.

CLINICAL DATA: Sudden onset of vertigo. Sudden onset dizziness
while sitting on exam table at PCP office. Symptoms resolved after
10 minutes. Dizziness is now recurring based on head positioning.

EXAM:
CT ANGIOGRAPHY HEAD AND NECK
TECHNIQUE: Multidetector CT imaging of the head and neck was performed using
the standard protocol during bolus administration of intravenous
contrast. Multiplanar CT image reconstructions and MIPs were
obtained to evaluate the vascular anatomy. Carotid stenosis
measurements (when applicable) are obtained utilizing NASCET
criteria, using the distal internal carotid diameter as the
denominator.
CONTRAST:  100mL OMNIPAQUE IOHEXOL 350 MG/ML SOLN

[Series 12: axial thin · axial · 0.39mm/px · z∈[-357,-137]mm · 5 of 332 slices shown]
[im 56/332  soft-tissue]
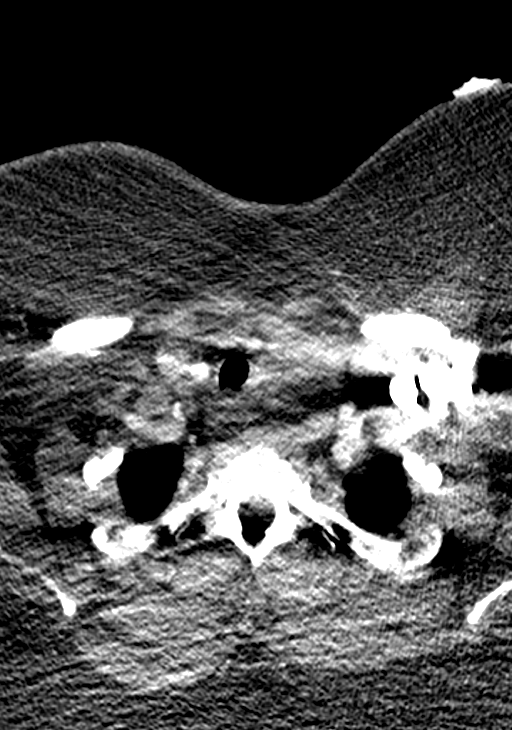
[im 111/332  bone]
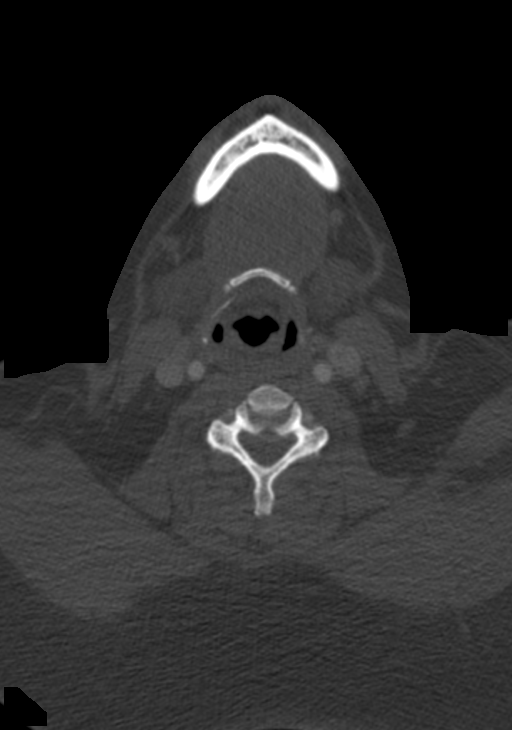
[im 166/332  soft-tissue]
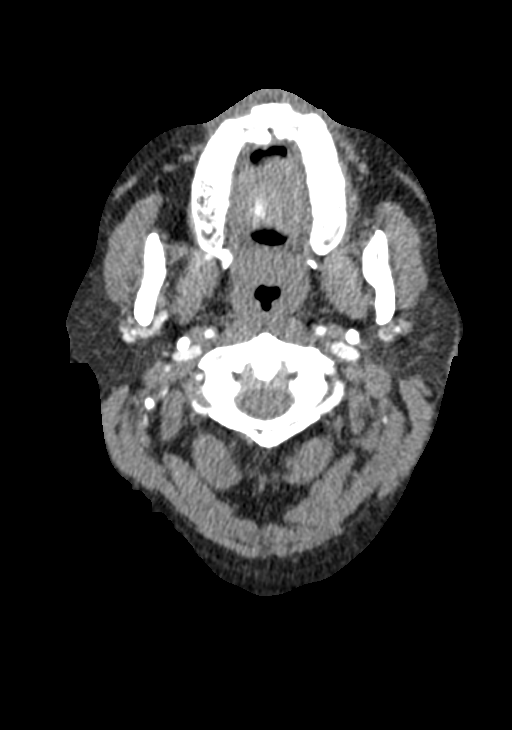
[im 221/332  bone]
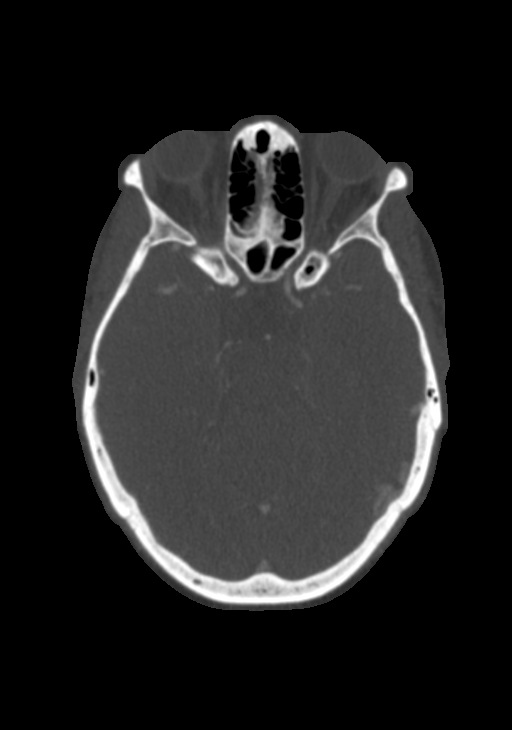
[im 276/332  soft-tissue]
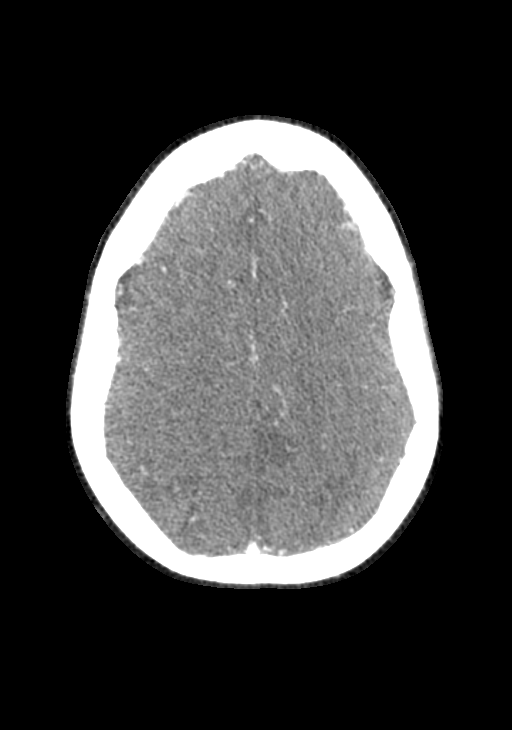

[5 of 33 positions shown; findings below may reference images not displayed]

FINDINGS: CT HEAD FINDINGS

Brain: Mild white matter changes are noted bilaterally. Mild
generalized atrophy is present. No acute infarct, hemorrhage, or
mass lesion is present. Cavum septum pellucidum is noted. Ventricles
are otherwise unremarkable. No significant extraaxial fluid
collection is present.

The brainstem and cerebellum are within normal limits.

Vascular: No hyperdense vessel or unexpected calcification.

Skull: Calvarium is intact. No focal lytic or blastic lesions are
present. No significant extracranial soft tissue lesion is present.

Sinuses: The paranasal sinuses and mastoid air cells are clear.

Orbits: The globes and orbits are within normal limits.

Review of the MIP images confirms the above findings

CTA NECK FINDINGS

Aortic arch: Common origin of the left common carotid artery and
innominate artery is noted. Aorta is unremarkable.

Right carotid system: The right common carotid artery is within
normal limits. Atherosclerotic calcifications are present at the
proximal right ICA without a significant stenosis relative to the
more distal vessel. Cervical right ICA is otherwise normal.

Left carotid system: The left common carotid artery is within normal
limits. Noncalcified plaque is present in the proximal left ICA
without significant stenosis relative to the more distal vessel.
Cervical left ICA is otherwise normal.

Vertebral arteries: The vertebral arteries both originate from the
subclavian arteries. The left vertebral artery is dominant. No
significant stenosis is present in either vertebral artery in the
neck.

Skeleton: Straightening of the normal cervical lordosis is noted.
Mild endplate changes are present. No focal lytic or blastic lesions
are present. Lucency is present about the roots of the residual left
mandibular molar. Large dental caries is present in the left
maxilla.

Other neck: Thyroid goiter extends into the upper mediastinum.
Dominant nodule in the left lobe measures 3.2 x 2.8 cm. Trachea is
intact. No significant adenopathy is present. Salivary glands are
normal.

Upper chest: Lung apices are clear.

Review of the MIP images confirms the above findings

CTA HEAD FINDINGS

Anterior circulation: Atherosclerotic calcifications are present
within the cavernous internal carotid arteries bilaterally without
significant stenosis. ICA termini are within normal limits
bilaterally. The A1 and M1 segments are normal. MCA bifurcations are
intact. ACA and MCA branch vessels are within normal limits.

Posterior circulation: The left vertebral artery is the dominant
vessel. PICA origins are visualized and normal. Vertebrobasilar
junction is normal. The basilar artery is normal. Both posterior
cerebral arteries originate from basilar tip. PCA branch vessels are
within normal limits.

Venous sinuses: The dural sinuses are patent. Straight sinus and
deep cerebral veins are intact. Cortical veins are within normal
limits.

Anatomic variants: None

Review of the MIP images confirms the above findings
IMPRESSION: 1. Mild atherosclerotic changes at the proximal internal carotid
arteries bilaterally without significant stenosis relative to the
more distal vessel. Noncalcified plaque is present on the left.
2. Atherosclerotic changes within the cavernous internal carotid
arteries bilaterally without significant stenosis.
3. No other significant proximal stenosis, aneurysm, or branch
vessel occlusion within the Circle of Willis.
4. Thyroid goiter with a dominant nodule in the left lobe measuring
3.2 x 2.8 cm. Recommend thyroid ultrasound (ref: [HOSPITAL].
[DATE]): 143-50).

## 2021-09-14 ENCOUNTER — Ambulatory Visit: Payer: BC Managed Care – PPO | Admitting: Family

## 2021-09-27 ENCOUNTER — Other Ambulatory Visit: Payer: Self-pay

## 2021-09-27 ENCOUNTER — Ambulatory Visit (AMBULATORY_SURGERY_CENTER): Payer: BC Managed Care – PPO | Admitting: *Deleted

## 2021-09-27 ENCOUNTER — Encounter: Payer: Self-pay | Admitting: Gastroenterology

## 2021-09-27 VITALS — Ht 64.0 in | Wt 321.0 lb

## 2021-09-27 DIAGNOSIS — D509 Iron deficiency anemia, unspecified: Secondary | ICD-10-CM

## 2021-09-27 DIAGNOSIS — K921 Melena: Secondary | ICD-10-CM

## 2021-09-27 MED ORDER — NA SULFATE-K SULFATE-MG SULF 17.5-3.13-1.6 GM/177ML PO SOLN: 1.0000 | ORAL | 0 refills | Status: DC

## 2021-09-27 NOTE — Progress Notes (Signed)
Patient's pre-visit was done today over the phone with the patient. Name,DOB and address verified. Patient denies any allergies to Eggs and Soy. Patient denies any problems with anesthesia/sedation. Patient is not taking any diet pills or blood thinners. No home Oxygen. Packet of Prep instructions pt's emailed -pt is aware. Prep instructions sent to pt's MyChart (if activated).Patient understands to call us back with any questions or concerns. Patient is aware of our care-partner policy and [id number]-[id number] safety protocol.  The patient is COVID-19 vaccinated.

## 2021-09-29 ENCOUNTER — Encounter (HOSPITAL_COMMUNITY): Payer: Self-pay | Admitting: Gastroenterology

## 2021-10-05 ENCOUNTER — Encounter: Payer: Self-pay | Admitting: Gastroenterology

## 2021-10-12 ENCOUNTER — Encounter (HOSPITAL_COMMUNITY)
Admission: RE | Disposition: A | Discharge status: Home / Self Care | Payer: Self-pay | Source: Home / Self Care | Attending: Gastroenterology

## 2021-10-12 ENCOUNTER — Other Ambulatory Visit: Payer: Self-pay

## 2021-10-12 ENCOUNTER — Ambulatory Visit (HOSPITAL_COMMUNITY)
Admission: RE | Admit: 2021-10-12 | Discharge: 2021-10-12 | Disposition: A | Discharge status: Home / Self Care | Payer: BC Managed Care – PPO | Attending: Gastroenterology | Admitting: Gastroenterology

## 2021-10-12 ENCOUNTER — Ambulatory Visit (HOSPITAL_COMMUNITY): Payer: BC Managed Care – PPO | Admitting: Certified Registered Nurse Anesthetist

## 2021-10-12 ENCOUNTER — Encounter (HOSPITAL_COMMUNITY): Payer: Self-pay | Admitting: Gastroenterology

## 2021-10-12 DIAGNOSIS — K2970 Gastritis, unspecified, without bleeding: Secondary | ICD-10-CM

## 2021-10-12 DIAGNOSIS — K648 Other hemorrhoids: Secondary | ICD-10-CM | POA: Insufficient documentation

## 2021-10-12 DIAGNOSIS — Z79899 Other long term (current) drug therapy: Secondary | ICD-10-CM | POA: Diagnosis not present

## 2021-10-12 DIAGNOSIS — K2900 Acute gastritis without bleeding: Secondary | ICD-10-CM

## 2021-10-12 DIAGNOSIS — K635 Polyp of colon: Secondary | ICD-10-CM | POA: Insufficient documentation

## 2021-10-12 DIAGNOSIS — K5730 Diverticulosis of large intestine without perforation or abscess without bleeding: Secondary | ICD-10-CM | POA: Diagnosis not present

## 2021-10-12 DIAGNOSIS — D509 Iron deficiency anemia, unspecified: Secondary | ICD-10-CM | POA: Diagnosis not present

## 2021-10-12 DIAGNOSIS — Z6841 Body Mass Index (BMI) 40.0 and over, adult: Secondary | ICD-10-CM | POA: Diagnosis not present

## 2021-10-12 DIAGNOSIS — K2950 Unspecified chronic gastritis without bleeding: Secondary | ICD-10-CM | POA: Insufficient documentation

## 2021-10-12 DIAGNOSIS — I10 Essential (primary) hypertension: Secondary | ICD-10-CM | POA: Insufficient documentation

## 2021-10-12 DIAGNOSIS — D123 Benign neoplasm of transverse colon: Secondary | ICD-10-CM | POA: Diagnosis not present

## 2021-10-12 HISTORY — PX: BIOPSY: SHX5522

## 2021-10-12 HISTORY — PX: COLONOSCOPY WITH PROPOFOL: SHX5780

## 2021-10-12 HISTORY — PX: POLYPECTOMY: SHX5525

## 2021-10-12 HISTORY — PX: ESOPHAGOGASTRODUODENOSCOPY (EGD) WITH PROPOFOL: SHX5813

## 2021-10-12 SURGERY — ESOPHAGOGASTRODUODENOSCOPY (EGD) WITH PROPOFOL
Anesthesia: Monitor Anesthesia Care

## 2021-10-12 MED ORDER — PROPOFOL 10 MG/ML IV BOLUS
INTRAVENOUS | Status: AC
  Filled 2021-10-12: qty 20

## 2021-10-12 MED ORDER — PROPOFOL 10 MG/ML IV BOLUS
INTRAVENOUS | Status: DC | PRN
  Administered 2021-10-12: 20 mg via INTRAVENOUS
  Administered 2021-10-12: 30 mg via INTRAVENOUS

## 2021-10-12 MED ORDER — LANSOPRAZOLE 30 MG PO CPDR: 30.0000 mg | DELAYED_RELEASE_CAPSULE | Freq: Every day | ORAL | 11 refills | Status: DC

## 2021-10-12 MED ORDER — PROPOFOL 500 MG/50ML IV EMUL
INTRAVENOUS | Status: DC | PRN
  Administered 2021-10-12: 125 ug/kg/min via INTRAVENOUS

## 2021-10-12 MED ORDER — LIDOCAINE 2% (20 MG/ML) 5 ML SYRINGE
INTRAMUSCULAR | Status: DC | PRN
  Administered 2021-10-12: 100 mg via INTRAVENOUS

## 2021-10-12 MED ORDER — LACTATED RINGERS IV SOLN
INTRAVENOUS | Status: AC | PRN
  Administered 2021-10-12: 1000 mL via INTRAVENOUS

## 2021-10-12 MED ORDER — PHENYLEPHRINE 40 MCG/ML (10ML) SYRINGE FOR IV PUSH (FOR BLOOD PRESSURE SUPPORT)
PREFILLED_SYRINGE | INTRAVENOUS | Status: DC | PRN
  Administered 2021-10-12: 80 ug via INTRAVENOUS

## 2021-10-12 SURGICAL SUPPLY — 25 items
BLOCK BITE 60FR ADLT L/F BLUE (MISCELLANEOUS) ×5 IMPLANT
ELECT REM PT RETURN 9FT ADLT (ELECTROSURGICAL)
ELECTRODE REM PT RTRN 9FT ADLT (ELECTROSURGICAL) IMPLANT
FCP BXJMBJMB 240X2.8X (CUTTING FORCEPS)
FLOOR PAD 36X40 (MISCELLANEOUS) ×4
FORCEP RJ3 GP 1.8X160 W-NEEDLE (CUTTING FORCEPS) IMPLANT
FORCEPS BIOP RAD 4 LRG CAP 4 (CUTTING FORCEPS) IMPLANT
FORCEPS BIOP RJ4 240 W/NDL (CUTTING FORCEPS)
FORCEPS BXJMBJMB 240X2.8X (CUTTING FORCEPS) IMPLANT
INJECTOR/SNARE I SNARE (MISCELLANEOUS) IMPLANT
LUBRICANT JELLY 4.5OZ STERILE (MISCELLANEOUS) IMPLANT
MANIFOLD NEPTUNE II (INSTRUMENTS) IMPLANT
NDL SCLEROTHERAPY 25GX240 (NEEDLE) IMPLANT
NEEDLE SCLEROTHERAPY 25GX240 (NEEDLE) IMPLANT
PAD FLOOR 36X40 (MISCELLANEOUS) ×3 IMPLANT
PROBE APC STR FIRE (PROBE) IMPLANT
PROBE INJECTION GOLD (MISCELLANEOUS)
PROBE INJECTION GOLD 7FR (MISCELLANEOUS) IMPLANT
SNARE ROTATE MED OVAL 20MM (MISCELLANEOUS) IMPLANT
SNARE SHORT THROW 13M SML OVAL (MISCELLANEOUS) IMPLANT
SYR 50ML LL SCALE MARK (SYRINGE) IMPLANT
TRAP SPECIMEN MUCOUS 40CC (MISCELLANEOUS) IMPLANT
TUBING ENDO SMARTCAP PENTAX (MISCELLANEOUS) ×10 IMPLANT
TUBING IRRIGATION ENDOGATOR (MISCELLANEOUS) ×5 IMPLANT
WATER STERILE IRR 1000ML POUR (IV SOLUTION) IMPLANT

## 2021-10-12 NOTE — Discharge Instructions (Signed)
YOU HAD AN ENDOSCOPIC PROCEDURE TODAY: Refer to the procedure report and other information in the discharge instructions given to you for any specific questions about what was found during the examination. If this information does not answer your questions, please call [place] office at [phone] to clarify.  ° °YOU SHOULD EXPECT: Some feelings of bloating in the abdomen. Passage of more gas than usual. Walking can help get rid of the air that was put into your GI tract during the procedure and reduce the bloating. If you had a lower endoscopy (such as a colonoscopy or flexible sigmoidoscopy) you may notice spotting of blood in your stool or on the toilet paper. Some abdominal soreness may be present for a day or two, also. ° °DIET: Your first meal following the procedure should be a light meal and then it is ok to progress to your normal diet. A half-sandwich or bowl of soup is an example of a good first meal. Heavy or fried foods are harder to digest and may make you feel nauseous or bloated. Drink plenty of fluids but you should avoid alcoholic beverages for 24 hours. If you had a esophageal dilation, please see attached instructions for diet.   ° °ACTIVITY: Your care partner should take you home directly after the procedure. You should plan to take it easy, moving slowly for the rest of the day. You can resume normal activity the day after the procedure however YOU SHOULD NOT DRIVE, use power tools, machinery or perform tasks that involve climbing or major physical exertion for 24 hours (because of the sedation medicines used during the test).  ° °SYMPTOMS TO REPORT IMMEDIATELY: °A gastroenterologist can be reached at any hour. Please call [phone]  for any of the following symptoms:  °Following lower endoscopy (colonoscopy, flexible sigmoidoscopy) °Excessive amounts of blood in the stool  °Significant tenderness, worsening of abdominal pains  °Swelling of the abdomen that is new, acute  °Fever of 100° or  higher  °Following upper endoscopy (EGD, EUS, ERCP, esophageal dilation) °Vomiting of blood or coffee ground material  °New, significant abdominal pain  °New, significant chest pain or pain under the shoulder blades  °Painful or persistently difficult swallowing  °New shortness of breath  °Black, tarry-looking or red, bloody stools ° °FOLLOW UP:  °If any biopsies were taken you will be contacted by phone or by letter within the next 1-3 weeks. Call [phone]  if you have not heard about the biopsies in 3 weeks.  °Please also call with any specific questions about appointments or follow up tests. ° °

## 2021-10-12 NOTE — Anesthesia Postprocedure Evaluation (Signed)
Anesthesia Post Note  Patient: Marissa Bowman  Procedure(s) Performed: ESOPHAGOGASTRODUODENOSCOPY (EGD) WITH PROPOFOL COLONOSCOPY WITH PROPOFOL BIOPSY POLYPECTOMY     Patient location during evaluation: PACU Anesthesia Type: MAC Level of consciousness: awake and alert Pain management: pain level controlled Vital Signs Assessment: post-procedure vital signs reviewed and stable Respiratory status: spontaneous breathing, nonlabored ventilation, respiratory function stable and patient connected to nasal cannula oxygen Cardiovascular status: stable and blood pressure returned to baseline Postop Assessment: no apparent nausea or vomiting Anesthetic complications: no   No notable events documented.  Last Vitals:  Vitals:   10/12/21 1033 10/12/21 1040  BP: (!) 104/51 (!) 116/53  Pulse: 60 61  Resp: 20 19  Temp:    SpO2: 97% 97%    Last Pain:  Vitals:   10/12/21 1040  TempSrc:   PainSc: 0-No pain                 Canyon Lohr

## 2021-10-12 NOTE — Op Note (Signed)
[facility] Patient Name: Marissa Bowman Procedure Date: 10/12/2021 MRN: [id number] Attending MD: Mauri Pole , MD Date of Birth: Mar 03, 1966 CSN: [id number] Age: 55 Admit Type: Outpatient Procedure:                Colonoscopy Indications:              Unexplained iron deficiency anemia Providers:                Mauri Pole, MD, Burtis Junes, RN, Tyna Jaksch Technician Referring MD:              Medicines:                Monitored Anesthesia Care Complications:            No immediate complications. Estimated Blood Loss:     Estimated blood loss was minimal. Procedure:                Pre-Anesthesia Assessment:                           - Prior to the procedure, a History and Physical                            was performed, and patient medications and                            allergies were reviewed. The patient's tolerance of                            previous anesthesia was also reviewed. The risks                            and benefits of the procedure and the sedation                            options and risks were discussed with the patient.                            All questions were answered, and informed consent                            was obtained. Prior Anticoagulants: The patient has                            taken no previous anticoagulant or antiplatelet                            agents. ASA Grade Assessment: III - A patient with                            severe systemic disease. After reviewing the risks  and benefits, the patient was deemed in                            satisfactory condition to undergo the procedure.                           After obtaining informed consent, the colonoscope                            was passed under direct vision. Throughout the                            procedure, the patient's blood pressure, pulse, and                             oxygen saturations were monitored continuously. The                            [id number]-HQ190L ([id number]) Olympus colonoscope was                            introduced through the anus and advanced to the the                            cecum, identified by appendiceal orifice and                            ileocecal valve. The colonoscopy was performed                            without difficulty. The patient tolerated the                            procedure well. The quality of the bowel                            preparation was excellent. The ileocecal valve,                            appendiceal orifice, and rectum were photographed. Scope In: 9:57:48 AM Scope Out: 10:15:10 AM Scope Withdrawal Time: 0 hours 8 minutes 49 seconds  Total Procedure Duration: 0 hours 17 minutes 22 seconds  Findings:      The perianal and digital rectal examinations were normal.      A 5 mm polyp was found in the transverse colon. The polyp was sessile.       The polyp was removed with a cold snare. Resection and retrieval were       complete.      A few small-mouthed diverticula were found in the sigmoid colon.      Non-bleeding internal hemorrhoids were found during retroflexion. The       hemorrhoids were small. Impression:               - One 5 mm polyp in the transverse colon, removed  with a cold snare. Resected and retrieved.                           - Diverticulosis in the sigmoid colon.                           - Non-bleeding internal hemorrhoids. Moderate Sedation:      Not Applicable - Patient had care per Anesthesia. Recommendation:           - Patient has a contact number available for                            emergencies. The signs and symptoms of potential                            delayed complications were discussed with the                            patient. Return to normal activities tomorrow.                            Written discharge instructions were  provided to the                            patient.                           - Resume previous diet.                           - Continue present medications.                           - Await pathology results.                           - Repeat colonoscopy in 5-10 years for surveillance                            based on pathology results. Procedure Code(s):        --- Professional ---                           [id number], Colonoscopy, flexible; with removal of                            tumor(s), polyp(s), or other lesion(s) by snare                            technique Diagnosis Code(s):        --- Professional ---                           K63.5, Polyp of colon                           K64.8, Other hemorrhoids  D50.9, Iron deficiency anemia, unspecified                           K57.30, Diverticulosis of large intestine without                            perforation or abscess without bleeding CPT copyright 2019 American Medical Association. All rights reserved. The codes documented in this report are preliminary and upon coder review may  be revised to meet current compliance requirements. Mauri Pole, MD 10/12/2021 10:36:30 AM This report has been signed electronically. Number of Addenda: 0

## 2021-10-12 NOTE — Op Note (Signed)
[facility] Patient Name: Marissa Bowman Procedure Date: 10/12/2021 MRN: [id number] Attending MD: Mauri Pole , MD Date of Birth: 1965-12-27 CSN: [id number] Age: 55 Admit Type: Outpatient Procedure:                Upper GI endoscopy Indications:              Suspected upper gastrointestinal bleeding in                            patient with unexplained iron deficiency anemia Providers:                Mauri Pole, MD, Burtis Junes, RN, Tyna Jaksch Technician Referring MD:              Medicines:                Monitored Anesthesia Care Complications:            No immediate complications. Estimated Blood Loss:     Estimated blood loss was minimal. Procedure:                Pre-Anesthesia Assessment:                           - Prior to the procedure, a History and Physical                            was performed, and patient medications and                            allergies were reviewed. The patient's tolerance of                            previous anesthesia was also reviewed. The risks                            and benefits of the procedure and the sedation                            options and risks were discussed with the patient.                            All questions were answered, and informed consent                            was obtained. Prior Anticoagulants: The patient has                            taken no previous anticoagulant or antiplatelet                            agents. ASA Grade Assessment: III - A patient with  severe systemic disease. After reviewing the risks                            and benefits, the patient was deemed in                            satisfactory condition to undergo the procedure.                           After obtaining informed consent, the endoscope was                            passed under direct vision. Throughout the                             procedure, the patient's blood pressure, pulse, and                            oxygen saturations were monitored continuously. The                            [id number]-[id number] ([id number]) Olympus endoscope was introduced                            through the mouth, and advanced to the second part                            of duodenum. The upper GI endoscopy was                            accomplished without difficulty. The patient                            tolerated the procedure well. Scope In: Scope Out: Findings:      The Z-line was regular and was found 36 cm from the incisors.      The examined esophagus was normal.      Patchy moderate inflammation characterized by congestion (edema),       erosions and shallow ulcerations was found in the prepyloric region of       the stomach. Biopsies were taken with a cold forceps for histology.       Biopsies were taken with a cold forceps for Helicobacter pylori testing.      The cardia and gastric fundus were normal on retroflexion.      The examined duodenum was normal. Impression:               - Z-line regular, 36 cm from the incisors.                           - Normal esophagus.                           - Gastritis. Biopsied.                           -  Normal examined duodenum. Moderate Sedation:      Not Applicable - Patient had care per Anesthesia. Recommendation:           - Patient has a contact number available for                            emergencies. The signs and symptoms of potential                            delayed complications were discussed with the                            patient. Return to normal activities tomorrow.                            Written discharge instructions were provided to the                            patient.                           - Resume previous diet.                           - Continue present medications.                           - Await pathology results.                            - No aspirin, ibuprofen, naproxen, or other                            non-steroidal anti-inflammatory drugs.                           - Use Prevacid (lansoprazole) 30 mg PO daily.                           - Return to GI office at the next available                            appointment in 2-3 month, please call to schedule                            appointment. Procedure Code(s):        --- Professional ---                           [phone], Esophagogastroduodenoscopy, flexible,                            transoral; with biopsy, single or multiple Diagnosis Code(s):        --- Professional ---                           K29.70, Gastritis, unspecified,  without bleeding                           D50.9, Iron deficiency anemia, unspecified CPT copyright 2019 American Medical Association. All rights reserved. The codes documented in this report are preliminary and upon coder review may  be revised to meet current compliance requirements. Mauri Pole, MD 10/12/2021 10:33:49 AM This report has been signed electronically. Number of Addenda: 0

## 2021-10-12 NOTE — H&P (Signed)
[facility] History and Physical   Primary Care Physician:  [name], NP   Reason for Procedure:   Iron deficiency anemia  Plan:    EGD and colonoscopy     HPI: Marissa Bowman is a 55 y.o. female here for EGD and colonoscopy for iron deficiency anemia.  The risks and benefits as well as alternatives of endoscopic procedure(s) have been discussed and reviewed. All questions answered. The patient agrees to proceed.    Past Medical History:  Diagnosis Date   Anemia    on iron   Asthma    childhood   Hypertension     Past Surgical History:  Procedure Laterality Date   COLONOSCOPY     COLONOSCOPY WITH PROPOFOL N/A 02/03/2017   Procedure: COLONOSCOPY WITH PROPOFOL;  Surgeon: Mauri Pole, MD;  Location: [facility];  Service: Endoscopy;  Laterality: N/A;   NO PAST SURGERIES      Prior to Admission medications   Medication Sig Start Date End Date Taking? Authorizing Provider  carvedilol (COREG) 6.25 MG tablet TAKE ONE TABLET BY MOUTH TWICE A DAY WITH A MEAL 06/21/21  Yes [name], NP  ferrous sulfate 325 (65 FE) MG tablet Take 325 mg by mouth 2 (two) times daily with a meal.    Yes [provider]  hydrochlorothiazide (HYDRODIURIL) 25 MG tablet Take 1 tablet (25 mg total) by mouth daily. 04/16/21  Yes [name], NP  lisinopril (ZESTRIL) 40 MG tablet TAKE ONE TABLET BY MOUTH ONE TIME DAILY 05/29/21  Yes [name], NP  Na Sulfate-K Sulfate-Mg Sulf 17.5-3.13-1.6 GM/177ML SOLN Take 1 kit by mouth as directed. May use generic Suprep 09/27/21  Yes Gracey Tolle, Venia Minks, MD  potassium chloride (KLOR-CON) 10 MEQ tablet Take 1 tablet (10 mEq total) by mouth daily. 12/08/20  Yes [name], NP    No current facility-administered medications for this encounter.    Allergies as of 06/30/2021 - Review Complete 06/22/2021  Allergen Reaction Noted   Latex Rash 11/08/2016    Family History  Problem Relation Age of  Onset   Hypertension Mother    Hyperlipidemia Mother    Diabetes Mother    Lung cancer Father    Thyroid disease Sister    Fibromyalgia Sister    Colon cancer Neg Hx     Social History   Socioeconomic History   Marital status: Single    Spouse name: Not on file   Number of children: Not on file   Years of education: Not on file   Highest education level: Not on file  Occupational History   Not on file  Tobacco Use   Smoking status: Never   Smokeless tobacco: Never  Vaping Use   Vaping Use: Never used  Substance and Sexual Activity   Alcohol use: No   Drug use: No   Sexual activity: Not on file  Other Topics Concern   Not on file  Social History Narrative   1 son- age 35   Unemployed, plans to sub with the [employer]   Associated degree   Lives with mother Hospital doctor and son   Has 1 dog lab/spaniel mix   Enjoys reading   Social Determinants of Health   Financial Resource Strain: Not on file  Food Insecurity: Not on file  Transportation Needs: Not on file  Physical Activity: Not on file  Stress: Not on file  Social Connections: Not on file  Intimate Partner Violence: Not on file    Review  of Systems:  All other review of systems negative except as mentioned in the HPI.  Physical Exam: Vital signs in last 24 hours: Temp:  [98.1 F (36.7 C)] 98.1 F (36.7 C) (12/27 0817) Pulse Rate:  [67] 67 (12/27 0817) Resp:  [22] 22 (12/27 0817) BP: (153)/(59) 153/59 (12/27 0817) SpO2:  [100 %] 100 % (12/27 0817) Weight:  [145.6 kg] 145.6 kg (12/27 0817)   General:   Alert, NAD Lungs:  Clear .   Heart:  Regular rate and rhythm Abdomen:  Soft, nontender and nondistended. Neuro/Psych:  Alert and cooperative. Normal mood and affect. A and O x 3   K. Denzil Magnuson , MD [phone]

## 2021-10-12 NOTE — Transfer of Care (Signed)
Immediate Anesthesia Transfer of Care Note  Patient: Marissa Bowman  Procedure(s) Performed: ESOPHAGOGASTRODUODENOSCOPY (EGD) WITH PROPOFOL COLONOSCOPY WITH PROPOFOL BIOPSY POLYPECTOMY  Patient Location: Endoscopy Unit  Anesthesia Type:MAC  Level of Consciousness: drowsy and patient cooperative  Airway & Oxygen Therapy: Patient Spontanous Breathing and Patient connected to face mask oxygen  Post-op Assessment: Report given to RN and Post -op Vital signs reviewed and stable  Post vital signs: Reviewed and stable  Last Vitals:  Vitals Value Taken Time  BP    Temp    Pulse 66 10/12/21 1024  Resp 21 10/12/21 1024  SpO2 100 % 10/12/21 1024  Vitals shown include unvalidated device data.  Last Pain:  Vitals:   10/12/21 0817  TempSrc: Oral         Complications: No notable events documented.

## 2021-10-12 NOTE — Anesthesia Preprocedure Evaluation (Addendum)
Anesthesia Evaluation  Patient identified by MRN, date of birth, ID band Patient awake    Reviewed: Allergy & Precautions, NPO status , Patient's Chart, lab work & pertinent test results  Airway Mallampati: III  TM Distance: <3 FB Neck ROM: Full    Dental no notable dental hx. (+) Poor Dentition, Chipped, Missing, Dental Advisory Given   Pulmonary neg pulmonary ROS,    Pulmonary exam normal breath sounds clear to auscultation       Cardiovascular hypertension, Pt. on medications Normal cardiovascular exam Rhythm:Regular Rate:Normal     Neuro/Psych negative neurological ROS  negative psych ROS   GI/Hepatic negative GI ROS, Neg liver ROS,   Endo/Other  Morbid obesity  Renal/GU negative Renal ROS  negative genitourinary   Musculoskeletal negative musculoskeletal ROS (+)   Abdominal (+) + obese,   Peds negative pediatric ROS (+)  Hematology negative hematology ROS (+) anemia ,   Anesthesia Other Findings   Reproductive/Obstetrics negative OB ROS                            Anesthesia Physical  Anesthesia Plan  ASA: 3  Anesthesia Plan: MAC   Post-op Pain Management: Minimal or no pain anticipated   Induction: Intravenous  PONV Risk Score and Plan: 2 and Treatment may vary due to age or medical condition  Airway Management Planned: Simple Face Mask, Natural Airway and Nasal Cannula  Additional Equipment: None  Intra-op Plan:   Post-operative Plan:   Informed Consent: I have reviewed the patients History and Physical, chart, labs and discussed the procedure including the risks, benefits and alternatives for the proposed anesthesia with the patient or authorized representative who has indicated his/her understanding and acceptance.     Dental advisory given  Plan Discussed with: CRNA, Surgeon and Anesthesiologist  Anesthesia Plan Comments:        Anesthesia Quick  Evaluation

## 2021-10-13 ENCOUNTER — Encounter (HOSPITAL_COMMUNITY): Payer: Self-pay | Admitting: Gastroenterology

## 2021-10-14 LAB — SURGICAL PATHOLOGY

## 2021-10-27 ENCOUNTER — Other Ambulatory Visit: Payer: Self-pay | Admitting: Family

## 2021-10-28 ENCOUNTER — Encounter: Payer: Self-pay | Admitting: Gastroenterology

## 2021-11-22 ENCOUNTER — Other Ambulatory Visit (HOSPITAL_COMMUNITY): Payer: Self-pay

## 2021-12-06 ENCOUNTER — Other Ambulatory Visit: Payer: Self-pay | Admitting: Family

## 2021-12-14 ENCOUNTER — Ambulatory Visit (INDEPENDENT_AMBULATORY_CARE_PROVIDER_SITE_OTHER): Payer: BC Managed Care – PPO | Admitting: Family

## 2021-12-14 VITALS — BP 103/71 | HR 71 | Temp 99.0°F | Resp 16 | Ht 64.0 in | Wt 311.0 lb

## 2021-12-14 DIAGNOSIS — I10 Essential (primary) hypertension: Secondary | ICD-10-CM | POA: Diagnosis not present

## 2021-12-14 DIAGNOSIS — K219 Gastro-esophageal reflux disease without esophagitis: Secondary | ICD-10-CM | POA: Insufficient documentation

## 2021-12-14 DIAGNOSIS — D509 Iron deficiency anemia, unspecified: Secondary | ICD-10-CM

## 2021-12-14 NOTE — Assessment & Plan Note (Addendum)
°  BP Readings from Last 3 Encounters:  12/14/21 103/71  10/12/21 (!) 116/53  06/15/21 122/60   BP is a little low today, but pt is asymptomatic. Will continue current doses of carvedilol, Hctz,  and lisinopril. If she develops dizziness and/or if it is still low next visit, would consider medication adjustment.

## 2021-12-14 NOTE — Assessment & Plan Note (Signed)
Stable on prevacid. Continue same.  

## 2021-12-14 NOTE — Progress Notes (Signed)
By signing my name below, I, Shehryar Baig, attest that this documentation has been prepared under the direction and in the presence of [name], NP. 12/14/2021     Patient ID: Marissa Bowman, female    DOB: Oct 26, 1965, 56 y.o.   MRN: [id number]  No chief complaint on file.   HPI Patient is in today for an office visit  Refills - At this time, patient is not requesting new refills.  Blood Pressure - Her blood pressure is doing well. She is going to continue taking lisinopril and carvedilol. Patient does not report any symptoms of dizziness. Patient continues taking potassium supplements. Patient continues to take 25 MG hydrochlorothiazide supplements twice a day.  BP Readings from Last 3 Encounters:  12/14/21 103/71  10/12/21 (!) 116/53  06/15/21 122/60   Reflux - Patient takes 30 MG of lansoprazole every day to reduce acid. Iron - Patient experiences no constipation from taking iron supplements.  Pap smear-Patient has a pap smear that is due. She wants to do a physical next visit. Vaccination - Patient is recommended to take the bivalent COVID - booster vaccination.   Past Medical History:  Diagnosis Date   Anemia    on iron   Asthma    childhood   Hypertension     Past Surgical History:  Procedure Laterality Date   BIOPSY  10/12/2021   Procedure: BIOPSY;  Surgeon: Mauri Pole, MD;  Location: [facility];  Service: Endoscopy;;   COLONOSCOPY     COLONOSCOPY WITH PROPOFOL N/A 02/03/2017   Procedure: COLONOSCOPY WITH PROPOFOL;  Surgeon: Mauri Pole, MD;  Location: [facility];  Service: Endoscopy;  Laterality: N/A;   COLONOSCOPY WITH PROPOFOL N/A 10/12/2021   Procedure: COLONOSCOPY WITH PROPOFOL;  Surgeon: Mauri Pole, MD;  Location: [facility];  Service: Endoscopy;  Laterality: N/A;   ESOPHAGOGASTRODUODENOSCOPY (EGD) WITH PROPOFOL N/A 10/12/2021   Procedure: ESOPHAGOGASTRODUODENOSCOPY (EGD) WITH PROPOFOL;  Surgeon: Mauri Pole,  MD;  Location: [facility];  Service: Endoscopy;  Laterality: N/A;   NO PAST SURGERIES     POLYPECTOMY  10/12/2021   Procedure: POLYPECTOMY;  Surgeon: Mauri Pole, MD;  Location: [facility];  Service: Endoscopy;;    Family History  Problem Relation Age of Onset   Hypertension Mother    Hyperlipidemia Mother    Diabetes Mother    Lung cancer Father    Thyroid disease Sister    Fibromyalgia Sister    Colon cancer Neg Hx     Social History   Socioeconomic History   Marital status: Single    Spouse name: Not on file   Number of children: Not on file   Years of education: Not on file   Highest education level: Not on file  Occupational History   Not on file  Tobacco Use   Smoking status: Never   Smokeless tobacco: Never  Vaping Use   Vaping Use: Never used  Substance and Sexual Activity   Alcohol use: No   Drug use: No   Sexual activity: Not on file  Other Topics Concern   Not on file  Social History Narrative   1 son- age 38   Unemployed, plans to sub with the [place] schools   Associated degree   Lives with mother father/father and son   Has 1 dog lab/spaniel mix   Enjoys reading   Social Determinants of Health   Financial Resource Strain: Not on file  Food Insecurity: Not on file  Transportation  Needs: Not on file  Physical Activity: Not on file  Stress: Not on file  Social Connections: Not on file  Intimate Partner Violence: Not on file    Outpatient Medications Prior to Visit  Medication Sig Dispense Refill   carvedilol (COREG) 6.25 MG tablet TAKE ONE TABLET BY MOUTH TWICE A DAY WITH MEAL 60 tablet 3   ferrous sulfate 325 (65 FE) MG tablet Take 325 mg by mouth 2 (two) times daily with a meal.      hydrochlorothiazide (HYDRODIURIL) 25 MG tablet Take 1 tablet (25 mg total) by mouth daily. 90 tablet 1   lansoprazole (PREVACID) 30 MG capsule Take 1 capsule (30 mg total) by mouth daily. 30 capsule 11   lisinopril (ZESTRIL) 40 MG tablet TAKE ONE TABLET  BY MOUTH ONE TIME DAILY 90 tablet 1   meloxicam (MOBIC) 7.5 MG tablet Take 7.5 mg by mouth daily.     potassium chloride (KLOR-CON) 10 MEQ tablet Take 1 tablet (10 mEq total) by mouth daily. 30 tablet 30   meclizine (ANTIVERT) 25 MG tablet      No facility-administered medications prior to visit.    Allergies  Allergen Reactions   Latex Rash    Latex gloves with powder cause a rash    ROS     Objective:    Physical Exam Constitutional:      General: She is not in acute distress.    Appearance: Normal appearance. She is not ill-appearing.  HENT:     Head: Normocephalic and atraumatic.     Right Ear: External ear normal.     Left Ear: External ear normal.  Eyes:     Extraocular Movements: Extraocular movements intact.     Pupils: Pupils are equal, round, and reactive to light.  Cardiovascular:     Rate and Rhythm: Normal rate and regular rhythm.     Pulses: Normal pulses.     Heart sounds: Normal heart sounds. No murmur heard.   No gallop.  Pulmonary:     Effort: Pulmonary effort is normal. No respiratory distress.     Breath sounds: Normal breath sounds. No wheezing or rales.  Musculoskeletal:     Right lower leg: No edema.     Left lower leg: No edema.  Skin:    General: Skin is warm and dry.  Neurological:     Mental Status: She is alert and oriented to person, place, and time.  Psychiatric:        Judgment: Judgment normal.    BP 103/71 (BP Location: Right Arm, Patient Position: Sitting, Cuff Size: Large)    Pulse 71    Temp 99 F (37.2 C) (Oral)    Resp 16    Ht 5\' 4"  (1.626 m)    Wt (!) 311 lb (141.1 kg)    LMP 01/27/2017    SpO2 99%    BMI 53.38 kg/m  Wt Readings from Last 3 Encounters:  12/14/21 (!) 311 lb (141.1 kg)  10/12/21 (!) 321 lb (145.6 kg)  09/27/21 (!) 321 lb (145.6 kg)    Diabetic Foot Exam - Simple   No data filed    Lab Results  Component Value Date   WBC 7.0 04/16/2021   HGB 10.2 (L) 04/16/2021   HCT 31.6 (L) 04/16/2021   PLT  309.0 04/16/2021   GLUCOSE 117 (H) 04/16/2021   CHOL 201 (H) 09/20/2017   TRIG 84.0 09/20/2017   HDL 63.20 09/20/2017   LDLCALC 121 (H) 09/20/2017  ALT 10 04/16/2021   AST 11 04/16/2021   NA 141 04/16/2021   K 3.7 04/16/2021   CL 103 04/16/2021   CREATININE 0.69 04/16/2021   BUN 10 04/16/2021   CO2 31 04/16/2021   TSH 3.14 09/20/2017   HGBA1C 5.8 04/16/2021    Lab Results  Component Value Date   TSH 3.14 09/20/2017   Lab Results  Component Value Date   WBC 7.0 04/16/2021   HGB 10.2 (L) 04/16/2021   HCT 31.6 (L) 04/16/2021   MCV 78.2 04/16/2021   PLT 309.0 04/16/2021   Lab Results  Component Value Date   NA 141 04/16/2021   K 3.7 04/16/2021   CO2 31 04/16/2021   GLUCOSE 117 (H) 04/16/2021   BUN 10 04/16/2021   CREATININE 0.69 04/16/2021   BILITOT 0.4 04/16/2021   ALKPHOS 61 04/16/2021   AST 11 04/16/2021   ALT 10 04/16/2021   PROT 7.0 04/16/2021   ALBUMIN 3.8 04/16/2021   CALCIUM 8.8 04/16/2021   ANIONGAP 9 01/28/2020   GFR 96.37 04/16/2021   Lab Results  Component Value Date   CHOL 201 (H) 09/20/2017   Lab Results  Component Value Date   HDL 63.20 09/20/2017   Lab Results  Component Value Date   LDLCALC 121 (H) 09/20/2017   Lab Results  Component Value Date   TRIG 84.0 09/20/2017   Lab Results  Component Value Date   CHOLHDL 3 09/20/2017   Lab Results  Component Value Date   HGBA1C 5.8 04/16/2021       Assessment & Plan:   Problem List Items Addressed This Visit       Unprioritized   HTN (hypertension)     BP Readings from Last 3 Encounters:  12/14/21 103/71  10/12/21 (!) 116/53  06/15/21 122/60   BP is a little low today, but pt is asymptomatic. Will continue current doses of carvedilol, Hctz,  and lisinopril. If she develops dizziness and/or if it is still low next visit, would consider medication adjustment.       Relevant Orders   Basic metabolic panel   GERD (gastroesophageal reflux disease)    Stable on prevacid.   Continue same.       Anemia, iron deficiency - Primary    Lab Results  Component Value Date   WBC 7.0 04/16/2021   HGB 10.2 (L) 04/16/2021   HCT 31.6 (L) 04/16/2021   MCV 78.2 04/16/2021   PLT 309.0 04/16/2021  Tolerating iron supplement. Obtain follow up lab work.      Relevant Orders   CBC with Differential/Platelet   Iron   Ferritin      No orders of the defined types were placed in this encounter.   I, [name], NP, personally preformed the services described in this documentation.  All medical record entries made by the scribe were at my direction and in my presence.  I have reviewed the chart and discharge instructions (if applicable) and agree that the record reflects my personal performance and is accurate and complete. 12/14/2021   I,Shehryar Baig,acting as a scribe for [name], NP.,have documented all relevant documentation on the behalf of [name], NP,as directed by  [name], NP while in the presence of [name], NP.    [name], NP

## 2021-12-14 NOTE — Assessment & Plan Note (Addendum)
Lab Results  Component Value Date   WBC 7.0 04/16/2021   HGB 10.2 (L) 04/16/2021   HCT 31.6 (L) 04/16/2021   MCV 78.2 04/16/2021   PLT 309.0 04/16/2021   Tolerating iron supplement. Obtain follow up lab work.

## 2021-12-14 NOTE — Patient Instructions (Signed)
Please get your bivalent covid booster.

## 2021-12-15 LAB — CBC WITH DIFFERENTIAL/PLATELET
Basophils Absolute: 0 10*3/uL (ref 0.0–0.1)
Basophils Relative: 0.3 % (ref 0.0–3.0)
Eosinophils Absolute: 0.2 10*3/uL (ref 0.0–0.7)
Eosinophils Relative: 2.5 % (ref 0.0–5.0)
HCT: 30.8 % — ABNORMAL LOW (ref 36.0–46.0)
Hemoglobin: 9.8 g/dL — ABNORMAL LOW (ref 12.0–15.0)
Lymphocytes Relative: 18.2 % (ref 12.0–46.0)
Lymphs Abs: 1.2 10*3/uL (ref 0.7–4.0)
MCHC: 31.8 g/dL (ref 30.0–36.0)
MCV: 80.8 fl (ref 78.0–100.0)
Monocytes Absolute: 0.5 10*3/uL (ref 0.1–1.0)
Monocytes Relative: 8 % (ref 3.0–12.0)
Neutro Abs: 4.8 10*3/uL (ref 1.4–7.7)
Neutrophils Relative %: 71 % (ref 43.0–77.0)
Platelets: 326 10*3/uL (ref 150.0–400.0)
RBC: 3.81 Mil/uL — ABNORMAL LOW (ref 3.87–5.11)
RDW: 14.5 % (ref 11.5–15.5)
WBC: 6.7 10*3/uL (ref 4.0–10.5)

## 2021-12-15 LAB — BASIC METABOLIC PANEL
BUN: 33 mg/dL — ABNORMAL HIGH (ref 6–23)
CO2: 27 mEq/L (ref 19–32)
Calcium: 8.8 mg/dL (ref 8.4–10.5)
Chloride: 102 mEq/L (ref 96–112)
Creatinine, Ser: 1.75 mg/dL — ABNORMAL HIGH (ref 0.40–1.20)
GFR: 31.83 mL/min — ABNORMAL LOW (ref >60.00)
Glucose, Bld: 88 mg/dL (ref 70–99)
Potassium: 4.2 mEq/L (ref 3.5–5.1)
Sodium: 139 mEq/L (ref 135–145)

## 2021-12-15 LAB — FERRITIN: Ferritin: 271.6 ng/mL (ref 10.0–291.0)

## 2021-12-15 LAB — IRON: Iron: 30 ug/dL — ABNORMAL LOW (ref 42–145)

## 2022-01-03 ENCOUNTER — Other Ambulatory Visit: Payer: Self-pay | Admitting: Family

## 2022-01-05 DIAGNOSIS — M5136 Other intervertebral disc degeneration, lumbar region: Secondary | ICD-10-CM | POA: Insufficient documentation

## 2022-01-11 ENCOUNTER — Ambulatory Visit: Payer: BC Managed Care – PPO | Admitting: Family

## 2022-01-11 ENCOUNTER — Encounter: Payer: Self-pay | Admitting: Gastroenterology

## 2022-01-11 ENCOUNTER — Other Ambulatory Visit (HOSPITAL_COMMUNITY)
Admission: RE | Admit: 2022-01-11 | Discharge: 2022-01-11 | Disposition: A | Discharge status: Home / Self Care | Payer: BC Managed Care – PPO | Source: Ambulatory Visit | Attending: Family | Admitting: Family

## 2022-01-11 VITALS — BP 126/55 | HR 73 | Temp 98.3°F | Resp 16 | Wt 301.0 lb

## 2022-01-11 DIAGNOSIS — D509 Iron deficiency anemia, unspecified: Secondary | ICD-10-CM

## 2022-01-11 DIAGNOSIS — I10 Essential (primary) hypertension: Secondary | ICD-10-CM

## 2022-01-11 DIAGNOSIS — Z01419 Encounter for gynecological examination (general) (routine) without abnormal findings: Secondary | ICD-10-CM

## 2022-01-11 DIAGNOSIS — K219 Gastro-esophageal reflux disease without esophagitis: Secondary | ICD-10-CM | POA: Diagnosis not present

## 2022-01-11 NOTE — Assessment & Plan Note (Signed)
Very difficult pap smear due to habitus.  I did not have a good visualization of the cervix.  I did collect a pap specimen. If endocervical component is not present, will plan to refer to GYN for repeat pap.  ?

## 2022-01-11 NOTE — Assessment & Plan Note (Signed)
BP is stable. Continue carvedilol and lisinopril.  ?

## 2022-01-11 NOTE — Assessment & Plan Note (Signed)
Continues prevacid. I advised her to follow up with her GI provider to discuss nausea/diarrhea concerns.   ?

## 2022-01-11 NOTE — Progress Notes (Signed)
? ?Subjective:  ? ?By signing my name below, I, Marissa Bowman, attest that this documentation has been prepared under the direction and in the presence of Marissa Bowman, 01/11/2022  ? ? Patient ID: Marissa Bowman, female    DOB: 02/05/1966, 56 y.o.   MRN: [id number] ? ?No chief complaint on file. ? ? ?HPI ?Patient is in today for an office visit. ? ? ?Blood Pressure - As of today's visit, her blood pressure is good. She is currently taking 40 MG of Lisinopril and 6.25 MG of Carvedilol.  ? ?Iron deficiency anemia - Her iron levels are low. She is currently taking 325 MG of Ferrous Sulfate twice daily. She had a recent colonoscopy which noted some polyps and endoscopy which noted patchy moderate inflammation characterized by congestion (edema), erosions and shallow ulcerations was found in the prepyloric region of the stomach ?Lab Results  ?Component Value Date  ? IRON 30 (L) 12/14/2021  ? TIBC 222 (L) 04/16/2021  ? FERRITIN 271.6 12/14/2021  ? ?GERD- she is following with GI and is currently being treated with prevacid.  Notes that she has had some nausea and diarrhea since she started the prevacid.  ? ?Pap smear- pt is overdue for pap smear.  ? ?Health Maintenance Due  ?Topic Date Due  ? PAP SMEAR-Modifier  09/20/2020  ? COVID-19 Vaccine (5 - Booster for [place] series) 06/11/2021  ? ? ?Past Medical History:  ?Diagnosis Date  ? Anemia   ? on iron  ? Asthma   ? childhood  ? Hypertension   ? ? ?Past Surgical History:  ?Procedure Laterality Date  ? BIOPSY  10/12/2021  ? Procedure: BIOPSY;  Surgeon: Marissa Pole, MD;  Location: [facility];  Service: Endoscopy;;  ? COLONOSCOPY    ? COLONOSCOPY WITH PROPOFOL N/A 02/03/2017  ? Procedure: COLONOSCOPY WITH PROPOFOL;  Surgeon: Marissa Pole, MD;  Location: [facility];  Service: Endoscopy;  Laterality: N/A;  ? COLONOSCOPY WITH PROPOFOL N/A 10/12/2021  ? Procedure: COLONOSCOPY WITH PROPOFOL;  Surgeon: Marissa Pole, MD;  Location: [facility];   Service: Endoscopy;  Laterality: N/A;  ? ESOPHAGOGASTRODUODENOSCOPY (EGD) WITH PROPOFOL N/A 10/12/2021  ? Procedure: ESOPHAGOGASTRODUODENOSCOPY (EGD) WITH PROPOFOL;  Surgeon: Marissa Pole, MD;  Location: [facility];  Service: Endoscopy;  Laterality: N/A;  ? NO PAST SURGERIES    ? POLYPECTOMY  10/12/2021  ? Procedure: POLYPECTOMY;  Surgeon: Marissa Pole, MD;  Location: [facility];  Service: Endoscopy;;  ? ? ?Family History  ?Problem Relation Age of Onset  ? Hypertension Mother   ? Hyperlipidemia Mother   ? Diabetes Mother   ? Lung cancer Father   ? Thyroid disease Sister   ? Fibromyalgia Sister   ? Colon cancer Neg Hx   ? ? ?Social History  ? ?Socioeconomic History  ? Marital status: Single  ?  Spouse name: Not on file  ? Number of children: Not on file  ? Years of education: Not on file  ? Highest education level: Not on file  ?Occupational History  ? Not on file  ?Tobacco Use  ? Smoking status: Never  ? Smokeless tobacco: Never  ?Vaping Use  ? Vaping Use: Never used  ?Substance and Sexual Activity  ? Alcohol use: No  ? Drug use: No  ? Sexual activity: Not on file  ?Other Topics Concern  ? Not on file  ?Social History Narrative  ? 1 son- age 5  ? Unemployed, plans to sub with the [employer] [employer]  ?  Associated degree  ? Lives with mother father/father and son  ? Has 1 dog lab/spaniel mix  ? Enjoys reading  ? ?Social Determinants of Health  ? ?Financial Resource Strain: Not on file  ?Food Insecurity: Not on file  ?Transportation Needs: Not on file  ?Physical Activity: Not on file  ?Stress: Not on file  ?Social Connections: Not on file  ?Intimate Partner Violence: Not on file  ? ? ?Outpatient Medications Prior to Visit  ?Medication Sig Dispense Refill  ? carvedilol (COREG) 6.25 MG tablet TAKE ONE TABLET BY MOUTH TWICE A DAY WITH MEAL 60 tablet 3  ? ferrous sulfate 325 (65 FE) MG tablet Take 325 mg by mouth 2 (two) times daily with a meal.     ? hydrochlorothiazide (HYDRODIURIL) 25 MG tablet Take 1 tablet  (25 mg total) by mouth daily. 90 tablet 1  ? lansoprazole (PREVACID) 30 MG capsule Take 1 capsule (30 mg total) by mouth daily. 30 capsule 11  ? lisinopril (ZESTRIL) 40 MG tablet TAKE ONE TABLET BY MOUTH ONE TIME DAILY 90 tablet 1  ? potassium chloride (KLOR-CON) 10 MEQ tablet TAKE ONE TABLET BY MOUTH ONE TIME DAILY 90 tablet 30  ? meloxicam (MOBIC) 7.5 MG tablet Take 7.5 mg by mouth daily.    ? ?No facility-administered medications prior to visit.  ? ? ?Allergies  ?Allergen Reactions  ? Latex Rash  ?  Latex gloves with powder cause a rash  ? ? ?ROS ? ?  See HPI ? ?Objective:  ?  ?Physical Exam ?Exam conducted with a chaperone present.  ?Constitutional:   ?   General: She is not in acute distress. ?   Appearance: Normal appearance. She is not ill-appearing.  ?HENT:  ?   Head: Normocephalic and atraumatic.  ?   Right Ear: External ear normal.  ?   Left Ear: External ear normal.  ?Eyes:  ?   Extraocular Movements: Extraocular movements intact.  ?   Pupils: Pupils are equal, round, and reactive to light.  ?Cardiovascular:  ?   Rate and Rhythm: Normal rate and regular rhythm.  ?   Heart sounds: Normal heart sounds. No murmur heard. ?  No gallop.  ?Pulmonary:  ?   Effort: Pulmonary effort is normal. No respiratory distress.  ?   Breath sounds: Normal breath sounds. No wheezing or rales.  ?Chest:  ?Breasts: ?   Breasts are symmetrical.  ?   Right: Normal. No inverted nipple or mass.  ?   Left: Normal. No inverted nipple or mass.  ?Genitourinary: ?   General: Normal vulva.  ?   Exam position: Lithotomy position.  ?   Pubic Area: No rash.   ?   Labia:     ?   Right: No rash.     ?   Left: No rash.   ?   Vagina: Normal.  ?   Cervix: No cervical motion tenderness.  ?   Uterus: Normal.   ?   Adnexa: Right adnexa normal and left adnexa normal.  ?Skin: ?   General: Skin is warm and dry.  ?Neurological:  ?   Mental Status: She is alert and oriented to person, place, and time.  ?Psychiatric:     ?   Mood and Affect: Mood normal.      ?   Behavior: Behavior normal.     ?   Judgment: Judgment normal.  ? ? ?BP (!) 126/55 (BP Location: Right Arm, Patient Position: Sitting, Cuff Size: Large)  Pulse 73   Temp 98.3 ?F (36.8 ?C) (Oral)   Resp 16   Wt (!) 301 lb (136.5 kg)   LMP 01/27/2017   SpO2 100%   BMI 51.67 kg/m?  ?Wt Readings from Last 3 Encounters:  ?01/11/22 (!) 301 lb (136.5 kg)  ?12/14/21 (!) 311 lb (141.1 kg)  ?10/12/21 (!) 321 lb (145.6 kg)  ? ? ?   ?Assessment & Plan:  ? ?Problem List Items Addressed This Visit   ? ?  ? Unprioritized  ? HTN (hypertension)  ?  BP is stable. Continue carvedilol and lisinopril.  ?  ?  ? GERD (gastroesophageal reflux disease)  ?  Continues prevacid. I advised her to follow up with her GI provider to discuss nausea/diarrhea concerns.   ?  ?  ? Encounter for routine gynecological examination with Papanicolaou smear of cervix - Primary  ?  Very difficult pap smear due to habitus.  I did not have a good visualization of the cervix.  I did collect a pap specimen. If endocervical component is not present, will plan to refer to GYN for repeat pap.  ?  ?  ? Relevant Orders  ? Cytology - PAP( [place])  ? Anemia, iron deficiency  ?  Likely secondary to gastric ulcers noted on Endoscopy. Will repeat CBC. Continue iron supplement.   ?  ?  ? Relevant Orders  ? CBC with Differential/Platelet  ? ? ? ? ?No orders of the defined types were placed in this encounter. ? ? ?I, [name], Bowman, personally preformed the services described in this documentation.  All medical record entries made by the scribe were at my direction and in my presence.  I have reviewed the chart and discharge instructions (if applicable) and agree that the record reflects my personal performance and is accurate and complete. 01/11/2022 ? ? ?I,Amber Collins,acting as a Education administrator for [employer], Bowman.,have documented all relevant documentation on the behalf of [name], Bowman,as directed by  [name], Bowman  while in the presence of [name], Bowman. ? ? ? ?[name], Bowman ? ?

## 2022-01-11 NOTE — Assessment & Plan Note (Signed)
Likely secondary to gastric ulcers noted on Endoscopy. Will repeat CBC. Continue iron supplement.   ?

## 2022-01-11 NOTE — Patient Instructions (Signed)
Please complete lab work prior to leaving.   

## 2022-01-12 LAB — CBC WITH DIFFERENTIAL/PLATELET
Basophils Absolute: 0 10*3/uL (ref 0.0–0.1)
Basophils Relative: 0.4 % (ref 0.0–3.0)
Eosinophils Absolute: 0.1 10*3/uL (ref 0.0–0.7)
Eosinophils Relative: 1.8 % (ref 0.0–5.0)
HCT: 30.1 % — ABNORMAL LOW (ref 36.0–46.0)
Hemoglobin: 9.8 g/dL — ABNORMAL LOW (ref 12.0–15.0)
Lymphocytes Relative: 16.4 % (ref 12.0–46.0)
Lymphs Abs: 1.2 10*3/uL (ref 0.7–4.0)
MCHC: 32.7 g/dL (ref 30.0–36.0)
MCV: 80.4 fl (ref 78.0–100.0)
Monocytes Absolute: 0.6 10*3/uL (ref 0.1–1.0)
Monocytes Relative: 7.8 % (ref 3.0–12.0)
Neutro Abs: 5.5 10*3/uL (ref 1.4–7.7)
Neutrophils Relative %: 73.6 % (ref 43.0–77.0)
Platelets: 331 10*3/uL (ref 150.0–400.0)
RBC: 3.75 Mil/uL — ABNORMAL LOW (ref 3.87–5.11)
RDW: 15.6 % — ABNORMAL HIGH (ref 11.5–15.5)
WBC: 7.4 10*3/uL (ref 4.0–10.5)

## 2022-01-13 ENCOUNTER — Other Ambulatory Visit: Payer: Self-pay

## 2022-01-13 DIAGNOSIS — R1084 Generalized abdominal pain: Secondary | ICD-10-CM

## 2022-01-13 DIAGNOSIS — K2100 Gastro-esophageal reflux disease with esophagitis, without bleeding: Secondary | ICD-10-CM

## 2022-01-13 LAB — CYTOLOGY - PAP
Comment: NEGATIVE
Diagnosis: NEGATIVE
High risk HPV: NEGATIVE

## 2022-01-17 ENCOUNTER — Other Ambulatory Visit: Payer: Self-pay

## 2022-01-17 DIAGNOSIS — R1084 Generalized abdominal pain: Secondary | ICD-10-CM

## 2022-01-17 DIAGNOSIS — K2100 Gastro-esophageal reflux disease with esophagitis, without bleeding: Secondary | ICD-10-CM

## 2022-01-24 ENCOUNTER — Ambulatory Visit (HOSPITAL_BASED_OUTPATIENT_CLINIC_OR_DEPARTMENT_OTHER)
Admission: RE | Admit: 2022-01-24 | Discharge: 2022-01-24 | Disposition: A | Discharge status: Home / Self Care | Payer: BC Managed Care – PPO | Source: Ambulatory Visit | Attending: Gastroenterology | Admitting: Gastroenterology

## 2022-01-24 DIAGNOSIS — K2100 Gastro-esophageal reflux disease with esophagitis, without bleeding: Secondary | ICD-10-CM | POA: Insufficient documentation

## 2022-01-24 DIAGNOSIS — R1084 Generalized abdominal pain: Secondary | ICD-10-CM | POA: Diagnosis present

## 2022-01-27 ENCOUNTER — Ambulatory Visit: Payer: BC Managed Care – PPO | Admitting: Physician Assistant

## 2022-02-04 ENCOUNTER — Ambulatory Visit: Payer: BC Managed Care – PPO | Admitting: Gastroenterology

## 2022-02-15 ENCOUNTER — Ambulatory Visit: Payer: BC Managed Care – PPO | Admitting: Physician Assistant

## 2022-02-18 ENCOUNTER — Other Ambulatory Visit: Payer: Self-pay | Admitting: Family

## 2022-04-13 ENCOUNTER — Ambulatory Visit: Payer: BC Managed Care – PPO | Admitting: Family

## 2022-04-13 VITALS — BP 161/78 | HR 54 | Temp 98.6°F | Resp 16 | Ht 64.0 in | Wt 301.0 lb

## 2022-04-13 DIAGNOSIS — D509 Iron deficiency anemia, unspecified: Secondary | ICD-10-CM

## 2022-04-13 DIAGNOSIS — K219 Gastro-esophageal reflux disease without esophagitis: Secondary | ICD-10-CM

## 2022-04-13 DIAGNOSIS — I10 Essential (primary) hypertension: Secondary | ICD-10-CM

## 2022-04-13 LAB — BASIC METABOLIC PANEL
BUN: 13 mg/dL (ref 6–23)
CO2: 32 mEq/L (ref 19–32)
Calcium: 8.6 mg/dL (ref 8.4–10.5)
Chloride: 104 mEq/L (ref 96–112)
Creatinine, Ser: 0.97 mg/dL (ref 0.40–1.20)
GFR: 64.48 mL/min (ref >60.00)
Glucose, Bld: 88 mg/dL (ref 70–99)
Potassium: 3.8 mEq/L (ref 3.5–5.1)
Sodium: 142 mEq/L (ref 135–145)

## 2022-04-13 LAB — FERRITIN: Ferritin: 233.6 ng/mL (ref 10.0–291.0)

## 2022-04-13 LAB — IRON: Iron: 46 ug/dL (ref 42–145)

## 2022-04-13 MED ORDER — VALSARTAN 160 MG PO TABS: 160.0000 mg | ORAL_TABLET | Freq: Every day | ORAL | 0 refills | Status: DC

## 2022-04-13 NOTE — Assessment & Plan Note (Addendum)
BP Readings from Last 3 Encounters:  04/13/22 (!) 161/78  01/11/22 (!) 126/55  12/14/21 103/71   Uncontrolled on current regiment. HR too low to increase coreg.  Will continue current doses of coreg and hctz.  Change lisinopril to valsartan.

## 2022-04-13 NOTE — Progress Notes (Signed)
Subjective:   By signing my name below, I, [name], attest that this documentation has been prepared under the direction and in the presence of [name], NP 04/13/2022     Patient ID: Marissa Bowman, female    DOB: [birth year], 56 y.o.   MRN: [id number]  Chief Complaint  Patient presents with   Hypertension    Here for follow up    HPI Patient is in today for an office visit  Blood Pressure: As of today's visit, her blood pressure is elevating. She is currently taking 6.25 Mg of Carvedilol, 40 Mg of Lisinopril, and 25 Mg of Hydrochlorothiazide. She states that she took her medications during the morning of this appointment. Her blood reading for today's visit is 161/78.  BP Readings from Last 3 Encounters:  04/13/22 (!) 161/78  01/11/22 (!) 126/55  12/14/21 103/71   Pulse Readings from Last 3 Encounters:  04/13/22 (!) 54  01/11/22 73  12/14/21 71   Prevacid: She is continuing to take 30 Mg of Prevacid. Potassium: She is continuing to take 10 MEQ of Potassium Chloride.  Iron: She is currently taking 325 Mg of Ferrous Sulfate two times daily.  Immunizations: She has not received the Bivalent Covid 19 booster.   Health Maintenance Due  Topic Date Due   COVID-19 Vaccine (5 - Pfizer series) 06/11/2021    Past Medical History:  Diagnosis Date   Anemia    on iron   Asthma    childhood   Hypertension     Past Surgical History:  Procedure Laterality Date   BIOPSY  10/12/2021   Procedure: BIOPSY;  Surgeon: Mauri Pole, MD;  Location: [facility];  Service: Endoscopy;;   COLONOSCOPY     COLONOSCOPY WITH PROPOFOL N/A 02/03/2017   Procedure: COLONOSCOPY WITH PROPOFOL;  Surgeon: Mauri Pole, MD;  Location: [facility];  Service: Endoscopy;  Laterality: N/A;   COLONOSCOPY WITH PROPOFOL N/A 10/12/2021   Procedure: COLONOSCOPY WITH PROPOFOL;  Surgeon: Mauri Pole, MD;  Location: [facility];  Service: Endoscopy;  Laterality: N/A;    ESOPHAGOGASTRODUODENOSCOPY (EGD) WITH PROPOFOL N/A 10/12/2021   Procedure: ESOPHAGOGASTRODUODENOSCOPY (EGD) WITH PROPOFOL;  Surgeon: Mauri Pole, MD;  Location: [facility];  Service: Endoscopy;  Laterality: N/A;   NO PAST SURGERIES     POLYPECTOMY  10/12/2021   Procedure: POLYPECTOMY;  Surgeon: Mauri Pole, MD;  Location: [facility];  Service: Endoscopy;;    Family History  Problem Relation Age of Onset   Hypertension Mother    Hyperlipidemia Mother    Diabetes Mother    Lung cancer Father    Thyroid disease Sister    Fibromyalgia Sister    Colon cancer Neg Hx     Social History   Socioeconomic History   Marital status: Single    Spouse name: Not on file   Number of children: Not on file   Years of education: Not on file   Highest education level: Not on file  Occupational History   Not on file  Tobacco Use   Smoking status: Never   Smokeless tobacco: Never  Vaping Use   Vaping Use: Never used  Substance and Sexual Activity   Alcohol use: No   Drug use: No   Sexual activity: Not on file  Other Topics Concern   Not on file  Social History Narrative   1 son- age 54   Unemployed, plans to sub with the [employer]   Associated degree  Lives with mother father/father and son   Has 1 dog lab/spaniel mix   Enjoys reading   Social Determinants of Health   Financial Resource Strain: Not on file  Food Insecurity: Not on file  Transportation Needs: Not on file  Physical Activity: Not on file  Stress: Not on file  Social Connections: Not on file  Intimate Partner Violence: Not on file    Outpatient Medications Prior to Visit  Medication Sig Dispense Refill   carvedilol (COREG) 6.25 MG tablet TAKE ONE TABLET BY MOUTH TWICE A DAY WITH A MEAL 60 tablet 3   ferrous sulfate 325 (65 FE) MG tablet Take 325 mg by mouth 2 (two) times daily with a meal.      hydrochlorothiazide (HYDRODIURIL) 25 MG tablet Take 1 tablet (25 mg total) by mouth daily. 90 tablet  1   potassium chloride (KLOR-CON) 10 MEQ tablet TAKE ONE TABLET BY MOUTH ONE TIME DAILY 90 tablet 30   lisinopril (ZESTRIL) 40 MG tablet TAKE ONE TABLET BY MOUTH ONE TIME DAILY 90 tablet 1   lansoprazole (PREVACID) 30 MG capsule Take 1 capsule (30 mg total) by mouth daily. 30 capsule 11   No facility-administered medications prior to visit.    Allergies  Allergen Reactions   Latex Rash    Latex gloves with powder cause a rash    ROS See HPI    Objective:    Physical Exam Constitutional:      General: She is not in acute distress.    Appearance: Normal appearance. She is not ill-appearing.  HENT:     Head: Normocephalic and atraumatic.     Right Ear: External ear normal.     Left Ear: External ear normal.  Eyes:     Extraocular Movements: Extraocular movements intact.     Pupils: Pupils are equal, round, and reactive to light.  Cardiovascular:     Rate and Rhythm: Normal rate and regular rhythm.     Heart sounds: Normal heart sounds. No murmur heard.    No gallop.  Pulmonary:     Effort: Pulmonary effort is normal. No respiratory distress.     Breath sounds: Normal breath sounds. No wheezing or rales.  Skin:    General: Skin is warm and dry.  Neurological:     Mental Status: She is alert and oriented to person, place, and time.  Psychiatric:        Mood and Affect: Mood normal.        Behavior: Behavior normal.        Judgment: Judgment normal.     BP (!) 161/78   Pulse (!) 54   Temp 98.6 F (37 C) (Oral)   Resp 16   Ht '5\' 4"'$  (1.626 m)   Wt (!) 301 lb (136.5 kg)   LMP 01/27/2017   SpO2 99%   BMI 51.67 kg/m  Wt Readings from Last 3 Encounters:  04/13/22 (!) 301 lb (136.5 kg)  01/11/22 (!) 301 lb (136.5 kg)  12/14/21 (!) 311 lb (141.1 kg)       Assessment & Plan:   Problem List Items Addressed This Visit       Unprioritized   HTN (hypertension)    BP Readings from Last 3 Encounters:  04/13/22 (!) 161/78  01/11/22 (!) 126/55  12/14/21 103/71   Uncontrolled on current regiment. HR too low to increase coreg.  Will continue current doses of coreg and hctz.  Change lisinopril to valsartan.  Relevant Medications   valsartan (DIOVAN) 160 MG tablet   GERD (gastroesophageal reflux disease)    Stable on prevacid. Continue same.       Anemia, iron deficiency - Primary    Lab Results  Component Value Date   WBC 7.4 01/11/2022   HGB 9.8 (L) 01/11/2022   HCT 30.1 (L) 01/11/2022   MCV 80.4 01/11/2022   PLT 331.0 01/11/2022  Had 10.2 hgb in care everywhere 2 months ago. Will continue iron supplement and obtain follow up iron studies.       Relevant Orders   Iron   Ferritin   Basic metabolic panel    Meds ordered this encounter  Medications   valsartan (DIOVAN) 160 MG tablet    Sig: Take 1 tablet (160 mg total) by mouth daily.    Dispense:  30 tablet    Refill:  0    Order Specific Question:   Supervising Provider    Answer:   Penni Homans A [[id number]]    I, [name], NP, personally preformed the services described in this documentation.  All medical record entries made by the scribe were at my direction and in my presence.  I have reviewed the chart and discharge instructions (if applicable) and agree that the record reflects my personal performance and is accurate and complete. 04/13/2022   I,Amber Collins,acting as a scribe for [name], NP.,have documented all relevant documentation on the behalf of [name], NP,as directed by  [name], NP while in the presence of [name], NP.   [name], NP

## 2022-04-13 NOTE — Assessment & Plan Note (Addendum)
Lab Results  Component Value Date   WBC 7.4 01/11/2022   HGB 9.8 (L) 01/11/2022   HCT 30.1 (L) 01/11/2022   MCV 80.4 01/11/2022   PLT 331.0 01/11/2022   Had 10.2 hgb in care everywhere 2 months ago. Will continue iron supplement and obtain follow up iron studies.

## 2022-04-13 NOTE — Patient Instructions (Signed)
Stop lisinopril. Start Valsartan.

## 2022-04-13 NOTE — Assessment & Plan Note (Signed)
Stable on prevacid. Continue same.

## 2022-04-26 ENCOUNTER — Ambulatory Visit (INDEPENDENT_AMBULATORY_CARE_PROVIDER_SITE_OTHER): Payer: BC Managed Care – PPO | Admitting: Family

## 2022-04-26 ENCOUNTER — Other Ambulatory Visit: Payer: Self-pay | Admitting: Family

## 2022-04-26 ENCOUNTER — Telehealth: Payer: Self-pay | Admitting: Family

## 2022-04-26 VITALS — BP 118/88 | HR 80 | Temp 97.7°F | Resp 18 | Ht 64.0 in | Wt 301.2 lb

## 2022-04-26 DIAGNOSIS — I10 Essential (primary) hypertension: Secondary | ICD-10-CM | POA: Diagnosis not present

## 2022-04-26 DIAGNOSIS — R195 Other fecal abnormalities: Secondary | ICD-10-CM

## 2022-04-26 DIAGNOSIS — D509 Iron deficiency anemia, unspecified: Secondary | ICD-10-CM

## 2022-04-26 DIAGNOSIS — K219 Gastro-esophageal reflux disease without esophagitis: Secondary | ICD-10-CM

## 2022-04-26 LAB — BASIC METABOLIC PANEL
BUN: 11 mg/dL (ref 6–23)
CO2: 31 mEq/L (ref 19–32)
Calcium: 9 mg/dL (ref 8.4–10.5)
Chloride: 101 mEq/L (ref 96–112)
Creatinine, Ser: 0.88 mg/dL (ref 0.40–1.20)
GFR: 72.45 mL/min (ref >60.00)
Glucose, Bld: 76 mg/dL (ref 70–99)
Potassium: 3.6 mEq/L (ref 3.5–5.1)
Sodium: 141 mEq/L (ref 135–145)

## 2022-04-26 LAB — CBC WITH DIFFERENTIAL/PLATELET
Basophils Absolute: 0 10*3/uL (ref 0.0–0.1)
Basophils Relative: 0.7 % (ref 0.0–3.0)
Eosinophils Absolute: 0.2 10*3/uL (ref 0.0–0.7)
Eosinophils Relative: 3.5 % (ref 0.0–5.0)
HCT: 32.5 % — ABNORMAL LOW (ref 36.0–46.0)
Hemoglobin: 10.6 g/dL — ABNORMAL LOW (ref 12.0–15.0)
Lymphocytes Relative: 18.2 % (ref 12.0–46.0)
Lymphs Abs: 1.1 10*3/uL (ref 0.7–4.0)
MCHC: 32.7 g/dL (ref 30.0–36.0)
MCV: 79.6 fl (ref 78.0–100.0)
Monocytes Absolute: 0.5 10*3/uL (ref 0.1–1.0)
Monocytes Relative: 7.8 % (ref 3.0–12.0)
Neutro Abs: 4 10*3/uL (ref 1.4–7.7)
Neutrophils Relative %: 69.8 % (ref 43.0–77.0)
Platelets: 302 10*3/uL (ref 150.0–400.0)
RBC: 4.09 Mil/uL (ref 3.87–5.11)
RDW: 14.8 % (ref 11.5–15.5)
WBC: 5.8 10*3/uL (ref 4.0–10.5)

## 2022-04-26 MED ORDER — LANSOPRAZOLE 30 MG PO CPDR: 30.0000 mg | DELAYED_RELEASE_CAPSULE | Freq: Every day | ORAL | 11 refills | Status: DC

## 2022-04-26 NOTE — Assessment & Plan Note (Signed)
Recommend that she restart prevacid and schedule a follow up visit with GI.

## 2022-04-26 NOTE — Telephone Encounter (Signed)
It looks like she stopped prevacid. I would recommend that she restart prevacid and schedule follow up with GI.

## 2022-04-26 NOTE — Assessment & Plan Note (Signed)
Last visit iron studies were normal. Obtain follow up CBC, continue iron supplement.

## 2022-04-26 NOTE — Progress Notes (Signed)
Subjective:   By signing my name below, I, Kellie Simmering, attest that this documentation has been prepared under the direction and in the presence of [name], NP 04/26/2022.   Patient ID: Marissa Bowman, female    DOB: Aug 07, 1966, 56 y.o.   MRN: [id number]  Chief Complaint  Patient presents with   Anemia   Hypertension   Follow-up    HPI Patient is in today for an office visit.  She has no specific concerns today.  Blood pressure- Her blood pressure has decreased since her last visit and is within normal ranges. BP Readings from Last 3 Encounters:  04/26/22 118/88  04/13/22 (!) 161/78  01/11/22 (!) 126/55   Pulse Readings from Last 3 Encounters:  04/26/22 80  04/13/22 (!) 54  01/11/22 73   Iron- Her iron levels are within normal ranges. Lab Results  Component Value Date   IRON 46 04/13/2022   TIBC 222 (L) 04/16/2021   FERRITIN 233.6 04/13/2022    Past Medical History:  Diagnosis Date   Anemia    on iron   Asthma    childhood   Hypertension     Past Surgical History:  Procedure Laterality Date   BIOPSY  10/12/2021   Procedure: BIOPSY;  Surgeon: Mauri Pole, MD;  Location: [facility];  Service: Endoscopy;;   COLONOSCOPY     COLONOSCOPY WITH PROPOFOL N/A 02/03/2017   Procedure: COLONOSCOPY WITH PROPOFOL;  Surgeon: Mauri Pole, MD;  Location: [facility];  Service: Endoscopy;  Laterality: N/A;   COLONOSCOPY WITH PROPOFOL N/A 10/12/2021   Procedure: COLONOSCOPY WITH PROPOFOL;  Surgeon: Mauri Pole, MD;  Location: [facility];  Service: Endoscopy;  Laterality: N/A;   ESOPHAGOGASTRODUODENOSCOPY (EGD) WITH PROPOFOL N/A 10/12/2021   Procedure: ESOPHAGOGASTRODUODENOSCOPY (EGD) WITH PROPOFOL;  Surgeon: Mauri Pole, MD;  Location: [facility];  Service: Endoscopy;  Laterality: N/A;   NO PAST SURGERIES     POLYPECTOMY  10/12/2021   Procedure: POLYPECTOMY;  Surgeon: Mauri Pole, MD;  Location: [facility];   Service: Endoscopy;;    Family History  Problem Relation Age of Onset   Hypertension Mother    Hyperlipidemia Mother    Diabetes Mother    Lung cancer Father    Thyroid disease Sister    Fibromyalgia Sister    Colon cancer Neg Hx     Social History   Socioeconomic History   Marital status: Single    Spouse name: Not on file   Number of children: Not on file   Years of education: Not on file   Highest education level: Not on file  Occupational History   Not on file  Tobacco Use   Smoking status: Never   Smokeless tobacco: Never  Vaping Use   Vaping Use: Never used  Substance and Sexual Activity   Alcohol use: No   Drug use: No   Sexual activity: Not on file  Other Topics Concern   Not on file  Social History Narrative   1 son- age 9   Unemployed, plans to sub with the [employer]   Associated degree   Lives with mother father/father and son   Has 1 dog lab/spaniel mix   Enjoys reading   Social Determinants of Health   Financial Resource Strain: Not on file  Food Insecurity: Not on file  Transportation Needs: Not on file  Physical Activity: Not on file  Stress: Not on file  Social Connections: Not on file  Intimate Partner Violence:  Not on file    Outpatient Medications Prior to Visit  Medication Sig Dispense Refill   carvedilol (COREG) 6.25 MG tablet TAKE ONE TABLET BY MOUTH TWICE A DAY WITH A MEAL 60 tablet 3   ferrous sulfate 325 (65 FE) MG tablet Take 325 mg by mouth 2 (two) times daily with a meal.      hydrochlorothiazide (HYDRODIURIL) 25 MG tablet Take 1 tablet (25 mg total) by mouth daily. 90 tablet 1   potassium chloride (KLOR-CON) 10 MEQ tablet TAKE ONE TABLET BY MOUTH ONE TIME DAILY 90 tablet 30   valsartan (DIOVAN) 160 MG tablet Take 1 tablet (160 mg total) by mouth daily. 30 tablet 0   lansoprazole (PREVACID) 30 MG capsule Take 1 capsule (30 mg total) by mouth daily. 30 capsule 11   No facility-administered medications prior to visit.     Allergies  Allergen Reactions   Latex Rash    Latex gloves with powder cause a rash    ROS     Objective:    Physical Exam Constitutional:      Appearance: Normal appearance. She is not ill-appearing.  HENT:     Head: Normocephalic and atraumatic.     Right Ear: External ear normal.     Left Ear: External ear normal.  Eyes:     Extraocular Movements: Extraocular movements intact.     Pupils: Pupils are equal, round, and reactive to light.  Cardiovascular:     Rate and Rhythm: Normal rate and regular rhythm.     Pulses: Normal pulses.     Heart sounds: Normal heart sounds. No murmur heard.    No gallop.  Pulmonary:     Effort: Pulmonary effort is normal. No respiratory distress.     Breath sounds: Normal breath sounds. No wheezing.  Skin:    General: Skin is warm and dry.  Neurological:     Mental Status: She is alert and oriented to person, place, and time.  Psychiatric:        Judgment: Judgment normal.     BP 118/88 (BP Location: Left Arm, Patient Position: Sitting, Cuff Size: Large)   Pulse 80   Temp 97.7 F (36.5 C) (Oral)   Resp 18   Ht '5\' 4"'$  (1.626 m)   Wt (!) 301 lb 3.2 oz (136.6 kg)   LMP 01/27/2017   SpO2 96%   BMI 51.70 kg/m  Wt Readings from Last 3 Encounters:  04/26/22 (!) 301 lb 3.2 oz (136.6 kg)  04/13/22 (!) 301 lb (136.5 kg)  01/11/22 (!) 301 lb (136.5 kg)    Diabetic Foot Exam - Simple   No data filed    Lab Results  Component Value Date   WBC 7.4 01/11/2022   HGB 9.8 (L) 01/11/2022   HCT 30.1 (L) 01/11/2022   PLT 331.0 01/11/2022   GLUCOSE 88 04/13/2022   CHOL 201 (H) 09/20/2017   TRIG 84.0 09/20/2017   HDL 63.20 09/20/2017   LDLCALC 121 (H) 09/20/2017   ALT 10 04/16/2021   AST 11 04/16/2021   NA 142 04/13/2022   K 3.8 04/13/2022   CL 104 04/13/2022   CREATININE 0.97 04/13/2022   BUN 13 04/13/2022   CO2 32 04/13/2022   TSH 3.14 09/20/2017   HGBA1C 5.8 04/16/2021    Lab Results  Component Value Date   TSH 3.14  09/20/2017   Lab Results  Component Value Date   WBC 7.4 01/11/2022   HGB 9.8 (L) 01/11/2022   HCT  30.1 (L) 01/11/2022   MCV 80.4 01/11/2022   PLT 331.0 01/11/2022   Lab Results  Component Value Date   NA 142 04/13/2022   K 3.8 04/13/2022   CO2 32 04/13/2022   GLUCOSE 88 04/13/2022   BUN 13 04/13/2022   CREATININE 0.97 04/13/2022   BILITOT 0.4 04/16/2021   ALKPHOS 61 04/16/2021   AST 11 04/16/2021   ALT 10 04/16/2021   PROT 7.0 04/16/2021   ALBUMIN 3.8 04/16/2021   CALCIUM 8.6 04/13/2022   ANIONGAP 9 01/28/2020   GFR 64.48 04/13/2022   Lab Results  Component Value Date   CHOL 201 (H) 09/20/2017   Lab Results  Component Value Date   HDL 63.20 09/20/2017   Lab Results  Component Value Date   LDLCALC 121 (H) 09/20/2017   Lab Results  Component Value Date   TRIG 84.0 09/20/2017   Lab Results  Component Value Date   CHOLHDL 3 09/20/2017   Lab Results  Component Value Date   HGBA1C 5.8 04/16/2021       Assessment & Plan:   Problem List Items Addressed This Visit       Unprioritized   HTN (hypertension)    BP Readings from Last 3 Encounters:  04/26/22 118/88  04/13/22 (!) 161/78  01/11/22 (!) 126/55  BP is much better with switch from lisinopril to valsartan.  Continue same. Obtain follow up BMET today.      Relevant Orders   Basic metabolic panel   Heme positive stool    Had colo/endo which noted gastritis and internal hemorrhoids.       GERD (gastroesophageal reflux disease)    Recommend that she restart prevacid and schedule a follow up visit with GI.       Relevant Medications   lansoprazole (PREVACID) 30 MG capsule   Anemia, iron deficiency - Primary    Last visit iron studies were normal. Obtain follow up CBC, continue iron supplement.       Relevant Orders   CBC with Differential/Platelet     Meds ordered this encounter  Medications   lansoprazole (PREVACID) 30 MG capsule    Sig: Take 1 capsule (30 mg total) by mouth daily.     Dispense:  30 capsule    Refill:  11    Order Specific Question:   Supervising Provider    Answer:   Penni Homans A [[id number]]    I, [name], NP, personally preformed the services described in this documentation.  All medical record entries made by the scribe were at my direction and in my presence.  I have reviewed the chart and discharge instructions (if applicable) and agree that the record reflects my personal performance and is accurate and complete. 04/26/2022.  I,Mohammed Iqbal,acting as a Education administrator for [employer], NP.,have documented all relevant documentation on the behalf of [name], NP,as directed by  [name], NP while in the presence of [name], NP.   [name], NP

## 2022-04-26 NOTE — Assessment & Plan Note (Signed)
Had colo/endo which noted gastritis and internal hemorrhoids.

## 2022-04-26 NOTE — Assessment & Plan Note (Signed)
BP Readings from Last 3 Encounters:  04/26/22 118/88  04/13/22 (!) 161/78  01/11/22 (!) 126/55   BP is much better with switch from lisinopril to valsartan.  Continue same. Obtain follow up BMET today.

## 2022-04-27 ENCOUNTER — Other Ambulatory Visit: Payer: Self-pay | Admitting: Family

## 2022-04-27 NOTE — Telephone Encounter (Signed)
Patient reports she is still taking medication and she will follow up with GI as advised

## 2022-04-28 ENCOUNTER — Other Ambulatory Visit (HOSPITAL_COMMUNITY): Payer: Self-pay

## 2022-04-28 MED ORDER — FERROUS SULFATE 325 (65 FE) MG PO TABS
325.0000 mg | ORAL_TABLET | Freq: Two times a day (BID) | ORAL | 2 refills | Status: AC
  Filled 2022-04-28: qty 60, 30d supply, fill #0

## 2022-05-08 ENCOUNTER — Other Ambulatory Visit: Payer: Self-pay | Admitting: Family

## 2022-05-08 ENCOUNTER — Encounter: Payer: Self-pay | Admitting: Family

## 2022-06-29 ENCOUNTER — Other Ambulatory Visit: Payer: Self-pay | Admitting: Family

## 2022-07-26 ENCOUNTER — Ambulatory Visit: Payer: BC Managed Care – PPO | Admitting: Family

## 2022-07-26 ENCOUNTER — Telehealth: Payer: Self-pay | Admitting: Family

## 2022-07-26 ENCOUNTER — Encounter: Payer: Self-pay | Admitting: Family

## 2022-07-26 VITALS — BP 146/83 | HR 66 | Temp 98.6°F | Resp 16 | Wt 294.0 lb

## 2022-07-26 DIAGNOSIS — D509 Iron deficiency anemia, unspecified: Secondary | ICD-10-CM

## 2022-07-26 DIAGNOSIS — Z1231 Encounter for screening mammogram for malignant neoplasm of breast: Secondary | ICD-10-CM | POA: Diagnosis not present

## 2022-07-26 DIAGNOSIS — I10 Essential (primary) hypertension: Secondary | ICD-10-CM | POA: Diagnosis not present

## 2022-07-26 DIAGNOSIS — K219 Gastro-esophageal reflux disease without esophagitis: Secondary | ICD-10-CM | POA: Diagnosis not present

## 2022-07-26 DIAGNOSIS — D649 Anemia, unspecified: Secondary | ICD-10-CM

## 2022-07-26 DIAGNOSIS — Z23 Encounter for immunization: Secondary | ICD-10-CM | POA: Diagnosis not present

## 2022-07-26 MED ORDER — VALSARTAN 160 MG PO TABS: 160.0000 mg | ORAL_TABLET | Freq: Every day | ORAL | 1 refills | Status: DC

## 2022-07-26 NOTE — Assessment & Plan Note (Addendum)
BP Readings from Last 3 Encounters:  07/26/22 (!) 146/83  04/26/22 118/88  04/13/22 (!) 161/78   BP mildly elevated. Not taking valsartan, restart. Continue carvedilol and hctz.

## 2022-07-26 NOTE — Progress Notes (Signed)
Subjective:   By signing my name below, I, [name], attest that this documentation has been prepared under the direction and in the presence of Marissa Chimera, NP 07/26/2022   Patient ID: Marissa Bowman, female    DOB: 05/14/1966, 56 y.o.   MRN: [id number]  Chief Complaint  Patient presents with   Hypertension    Here for follow up, not taking valsartan "never picked up"    HPI Patient is in today for an office visit  Blood Pressure: Her blood pressure is elevating. She is currently taking 6.25 Mg of Carvedilol twice a day and 25 Mg of Hydrochlorothiazide. She is not taking the 160 Mg of Valsartan because she reports that she did not receive the medication from the pharmacy BP Readings from Last 3 Encounters:  07/26/22 (!) 146/83  04/26/22 118/88  04/13/22 (!) 161/78   Pulse Readings from Last 3 Encounters:  07/26/22 66  04/26/22 80  04/13/22 (!) 54   Reflux: She is currently taking 30 Mg of Prevacid daily. She states that symptoms are controlled.   Immunizations: She is interested in receiving an influenza vaccine during today's visit.  Blood Count: Her blood count is decreasing. Her stools are black but after taking a stool softeners/laxative, reports of resolved symptoms  Mammogram: Last completed on 06/28/2021. She is due for a mammogram and is interested in being referred for one.   Health Maintenance Due  Topic Date Due   COVID-19 Vaccine (5 - Pfizer series) 06/11/2021   INFLUENZA VACCINE  05/17/2022   MAMMOGRAM  06/28/2022    Past Medical History:  Diagnosis Date   Anemia    on iron   Asthma    childhood   Hypertension     Past Surgical History:  Procedure Laterality Date   BIOPSY  10/12/2021   Procedure: BIOPSY;  Surgeon: Mauri Pole, MD;  Location: [facility];  Service: Endoscopy;;   COLONOSCOPY     COLONOSCOPY WITH PROPOFOL N/A 02/03/2017   Procedure: COLONOSCOPY WITH PROPOFOL;  Surgeon: Mauri Pole, MD;  Location: [facility];  Service: Endoscopy;  Laterality: N/A;   COLONOSCOPY WITH PROPOFOL N/A 10/12/2021   Procedure: COLONOSCOPY WITH PROPOFOL;  Surgeon: Mauri Pole, MD;  Location: [facility];  Service: Endoscopy;  Laterality: N/A;   ESOPHAGOGASTRODUODENOSCOPY (EGD) WITH PROPOFOL N/A 10/12/2021   Procedure: ESOPHAGOGASTRODUODENOSCOPY (EGD) WITH PROPOFOL;  Surgeon: Mauri Pole, MD;  Location: [facility];  Service: Endoscopy;  Laterality: N/A;   NO PAST SURGERIES     POLYPECTOMY  10/12/2021   Procedure: POLYPECTOMY;  Surgeon: Mauri Pole, MD;  Location: [facility];  Service: Endoscopy;;    Family History  Problem Relation Age of Onset   Hypertension Mother    Hyperlipidemia Mother    Diabetes Mother    Lung cancer Father    Thyroid disease Sister    Fibromyalgia Sister    Colon cancer Neg Hx     Social History   Socioeconomic History   Marital status: Single    Spouse name: Not on file   Number of children: Not on file   Years of education: Not on file   Highest education level: Not on file  Occupational History   Not on file  Tobacco Use   Smoking status: Never   Smokeless tobacco: Never  Vaping Use   Vaping Use: Never used  Substance and Sexual Activity   Alcohol use: No   Drug use: No   Sexual activity: Not  on file  Other Topics Concern   Not on file  Social History Narrative   1 son- age 66   Unemployed, plans to sub with the [employer]   Associated degree   Lives with mother Hospital doctor and son   Has 1 dog lab/spaniel mix   Enjoys reading   Social Determinants of Health   Financial Resource Strain: Not on file  Food Insecurity: Not on file  Transportation Needs: Not on file  Physical Activity: Not on file  Stress: Not on file  Social Connections: Not on file  Intimate Partner Violence: Not on file    Outpatient Medications Prior to Visit  Medication Sig Dispense Refill   carvedilol (COREG) 6.25 MG tablet TAKE ONE TABLET BY MOUTH  TWICE A DAY WITH A MEAL 60 tablet 3   hydrochlorothiazide (HYDRODIURIL) 25 MG tablet TAKE ONE TABLET BY MOUTH ONE TIME DAILY 90 tablet 1   lansoprazole (PREVACID) 30 MG capsule Take 1 capsule (30 mg total) by mouth daily. 30 capsule 11   potassium chloride (KLOR-CON) 10 MEQ tablet TAKE ONE TABLET BY MOUTH ONE TIME DAILY 90 tablet 30   ferrous sulfate 325 (65 FE) MG tablet Take 1 tablet (325 mg total) by mouth 2 (two) times daily with a meal. 60 tablet 2   valsartan (DIOVAN) 160 MG tablet TAKE ONE TABLET BY MOUTH ONE TIME DAILY (Patient not taking: Reported on 07/26/2022) 90 tablet 1   No facility-administered medications prior to visit.    Allergies  Allergen Reactions   Latex Rash    Latex gloves with powder cause a rash    ROS See HPI    Objective:    Physical Exam Constitutional:      General: She is not in acute distress.    Appearance: Normal appearance. She is not ill-appearing.  HENT:     Head: Normocephalic and atraumatic.     Right Ear: External ear normal.     Left Ear: External ear normal.  Eyes:     Extraocular Movements: Extraocular movements intact.     Pupils: Pupils are equal, round, and reactive to light.  Cardiovascular:     Rate and Rhythm: Normal rate and regular rhythm.     Heart sounds: Normal heart sounds. No murmur heard.    No gallop.  Pulmonary:     Effort: Pulmonary effort is normal. No respiratory distress.     Breath sounds: Normal breath sounds. No wheezing or rales.  Musculoskeletal:     Right lower leg: No edema.     Left lower leg: No edema.  Skin:    General: Skin is warm and dry.  Neurological:     Mental Status: She is alert and oriented to person, place, and time.  Psychiatric:        Mood and Affect: Mood normal.        Behavior: Behavior normal.        Judgment: Judgment normal.     BP (!) 146/83 (BP Location: Right Arm, Patient Position: Sitting, Cuff Size: Large)   Pulse 66   Temp 98.6 F (37 C) (Oral)   Resp 16   Wt  294 lb (133.4 kg)   LMP 01/27/2017   SpO2 98%   BMI 50.46 kg/m  Wt Readings from Last 3 Encounters:  07/26/22 294 lb (133.4 kg)  04/26/22 (!) 301 lb 3.2 oz (136.6 kg)  04/13/22 (!) 301 lb (136.5 kg)       Assessment & Plan:  Problem List Items Addressed This Visit       Unprioritized   HTN (hypertension)    BP Readings from Last 3 Encounters:  07/26/22 (!) 146/83  04/26/22 118/88  04/13/22 (!) 161/78  BP mildly elevated. Not taking valsartan, restart. Continue carvedilol and hctz.       Relevant Medications   valsartan (DIOVAN) 160 MG tablet   Other Relevant Orders   Basic metabolic panel   GERD (gastroesophageal reflux disease)    Stable/improved on prevacid. Continue same.       Anemia, iron deficiency    Lab Results  Component Value Date   WBC 5.8 04/26/2022   HGB 10.6 (L) 04/26/2022   HCT 32.5 (L) 04/26/2022   MCV 79.6 04/26/2022   PLT 302.0 04/26/2022  Iron levels have been OK.  Will check b12 and folate.       Other Visit Diagnoses     Needs flu shot    -  Primary   Relevant Orders   Flu Vaccine QUAD 6+ mos PF IM (Fluarix Quad PF)   Anemia, unspecified type       Relevant Orders   B12 and Folate Panel   Fecal occult blood, imunochemical([facility])   Encounter for screening mammogram for malignant neoplasm of breast       Relevant Orders   MM 3D SCREEN BREAST BILATERAL      Meds ordered this encounter  Medications   valsartan (DIOVAN) 160 MG tablet    Sig: Take 1 tablet (160 mg total) by mouth daily.    Dispense:  90 tablet    Refill:  1    Order Specific Question:   Supervising Provider    Answer:   Penni Homans A [[id number]]    I, [name], NP, personally preformed the services described in this documentation.  All medical record entries made by the scribe were at my direction and in my presence.  I have reviewed the chart and discharge instructions (if applicable) and agree that the record reflects my personal performance  and is accurate and complete. 07/26/2022   I,Amber Collins,acting as a scribe for [name], NP.,have documented all relevant documentation on the behalf of [name], NP,as directed by  [name], NP while in the presence of [name], NP.    [name], NP

## 2022-07-26 NOTE — Assessment & Plan Note (Addendum)
Lab Results  Component Value Date   WBC 5.8 04/26/2022   HGB 10.6 (L) 04/26/2022   HCT 32.5 (L) 04/26/2022   MCV 79.6 04/26/2022   PLT 302.0 04/26/2022   Iron levels have been OK.  Will check b12 and folate.

## 2022-07-26 NOTE — Telephone Encounter (Signed)
See mychart.  

## 2022-07-26 NOTE — Assessment & Plan Note (Signed)
Stable/improved on prevacid. Continue same.

## 2022-07-27 ENCOUNTER — Telehealth: Payer: Self-pay | Admitting: Family

## 2022-07-27 LAB — B12 AND FOLATE PANEL
Folate: 16.3 ng/mL (ref >5.9)
Vitamin B-12: 232 pg/mL (ref 211–911)

## 2022-07-27 NOTE — Telephone Encounter (Signed)
B12 is low.  I would like for her to start b12 1044mg once weekly for 4 weeks then monthly.

## 2022-07-28 NOTE — Telephone Encounter (Signed)
Lvm for patient to call back

## 2022-07-29 NOTE — Telephone Encounter (Signed)
Patient advised of results and scheduled to start weekly B12 on 08-02-22.

## 2022-08-02 ENCOUNTER — Ambulatory Visit (INDEPENDENT_AMBULATORY_CARE_PROVIDER_SITE_OTHER): Payer: BC Managed Care – PPO

## 2022-08-02 DIAGNOSIS — E539 Vitamin B deficiency, unspecified: Secondary | ICD-10-CM | POA: Diagnosis not present

## 2022-08-02 DIAGNOSIS — R7989 Other specified abnormal findings of blood chemistry: Secondary | ICD-10-CM

## 2022-08-02 MED ORDER — LOSARTAN POTASSIUM 50 MG PO TABS: 50.0000 mg | ORAL_TABLET | Freq: Every day | ORAL | 2 refills | Status: DC

## 2022-08-02 MED ORDER — CYANOCOBALAMIN 1000 MCG/ML IJ SOLN
1000.0000 ug | Freq: Once | INTRAMUSCULAR | Status: AC
  Administered 2022-08-02: 1000 ug via INTRAMUSCULAR

## 2022-08-02 NOTE — Progress Notes (Signed)
Marissa Bowman is a 56 y.o. female presents to the office today for 1/4 weekly B12:injections, per physician's orders. Original order: 07/27/22: "B12 is low. I would like for her to start b12 1069mg once weekly for 4 weeks then monthly." B12 10072m given IM, was administered L deltoid  today. Patient tolerated injection. Patient due for follow up labs/provider appt: No.  Patient next injection due: 1 week, appt made Yes- already pending  Pt had questions about her Diovan- it was $48 at the pharmacy. Melissa sent in Losartan instead to cut back costs. Pt will have her BP checked at next weeks [place]. As well as check a BMET that same day. She will let usKoreanow if the Losartan is too expensive.   Creft, TiDarlis Loan

## 2022-08-02 NOTE — Telephone Encounter (Signed)
Stop valsartan, start losartan- nurse will review this with her at her nurse visit.

## 2022-08-02 NOTE — Addendum Note (Signed)
Addended by: [name] on: 08/02/2022 02:48 PM   Modules accepted: Orders

## 2022-08-08 NOTE — Progress Notes (Signed)
Marissa Bowman is a 56 y.o. female presents to the office today for 1/4 weekly B12:injections, per physician's orders. Original order: 07/27/22: "B12 is low. I would like for her to start b12 1062mg once weekly for 4 weeks then monthly." Cyanocobalamin, 1000 mg/ml IM was administered in L  Deltoid today.Patient tolerated injection. Patient due for follow up labs/provider appt: No.  Patient next injection due: 1 week, appt made yes    Pt here for Blood pressure check per MJeri Lager Sullivan  Pt currently takes:Coreg 6.25 mg BID, HCTZ 25 mg daily.  Pt reports compliance with medication. BP today @ = 140/80 HR = 72 Pt advised:  per [facility] or [facility] using [facility] card since Copay is $35.00 with insurance.

## 2022-08-09 ENCOUNTER — Ambulatory Visit (INDEPENDENT_AMBULATORY_CARE_PROVIDER_SITE_OTHER): Payer: BC Managed Care – PPO

## 2022-08-09 ENCOUNTER — Ambulatory Visit (HOSPITAL_BASED_OUTPATIENT_CLINIC_OR_DEPARTMENT_OTHER)
Admission: RE | Admit: 2022-08-09 | Discharge: 2022-08-09 | Disposition: A | Discharge status: Home / Self Care | Payer: BC Managed Care – PPO | Source: Ambulatory Visit | Attending: Family | Admitting: Family

## 2022-08-09 ENCOUNTER — Other Ambulatory Visit: Payer: Self-pay | Admitting: Family

## 2022-08-09 ENCOUNTER — Other Ambulatory Visit (INDEPENDENT_AMBULATORY_CARE_PROVIDER_SITE_OTHER): Payer: BC Managed Care – PPO

## 2022-08-09 ENCOUNTER — Encounter (HOSPITAL_BASED_OUTPATIENT_CLINIC_OR_DEPARTMENT_OTHER): Payer: Self-pay

## 2022-08-09 DIAGNOSIS — R7989 Other specified abnormal findings of blood chemistry: Secondary | ICD-10-CM

## 2022-08-09 DIAGNOSIS — Z1231 Encounter for screening mammogram for malignant neoplasm of breast: Secondary | ICD-10-CM | POA: Diagnosis present

## 2022-08-09 DIAGNOSIS — I10 Essential (primary) hypertension: Secondary | ICD-10-CM

## 2022-08-09 MED ORDER — LOSARTAN POTASSIUM 50 MG PO TABS: 50.0000 mg | ORAL_TABLET | Freq: Every day | ORAL | 2 refills | Status: DC

## 2022-08-09 MED ORDER — CYANOCOBALAMIN 1000 MCG/ML IJ SOLN
1000.0000 ug | Freq: Once | INTRAMUSCULAR | Status: AC
  Administered 2022-08-09: 1000 ug via INTRAMUSCULAR

## 2022-08-10 LAB — BASIC METABOLIC PANEL
BUN: 28 mg/dL — ABNORMAL HIGH (ref 6–23)
CO2: 31 mEq/L (ref 19–32)
Calcium: 9.2 mg/dL (ref 8.4–10.5)
Chloride: 98 mEq/L (ref 96–112)
Creatinine, Ser: 1.17 mg/dL (ref 0.40–1.20)
GFR: 51.37 mL/min — ABNORMAL LOW (ref >60.00)
Glucose, Bld: 85 mg/dL (ref 70–99)
Potassium: 3.8 mEq/L (ref 3.5–5.1)
Sodium: 140 mEq/L (ref 135–145)

## 2022-08-16 ENCOUNTER — Ambulatory Visit (INDEPENDENT_AMBULATORY_CARE_PROVIDER_SITE_OTHER): Payer: BC Managed Care – PPO

## 2022-08-16 DIAGNOSIS — R7989 Other specified abnormal findings of blood chemistry: Secondary | ICD-10-CM

## 2022-08-16 MED ORDER — CYANOCOBALAMIN 1000 MCG/ML IJ SOLN
1000.0000 ug | Freq: Once | INTRAMUSCULAR | Status: AC
  Administered 2022-08-16: 1000 ug via INTRAMUSCULAR

## 2022-08-16 NOTE — Progress Notes (Signed)
Marissa Bowman is a 56 y.o. female presents to the office today for B12 #3 of 4 weekly injections, per physician's orders. Original order: on telephone note from 07/27/2022 to start B12 once a week for 4 weeks and then monthly.  cyanocobalamin (med), 1000 mg/ml (dose),  IM (route) was administered left deltoid (location) today. Patient tolerated injection.  Patient next injection due: in one week for B12 4 of 4, appt made Yes for 08/23/22.   Jiles Prows

## 2022-08-23 ENCOUNTER — Ambulatory Visit (INDEPENDENT_AMBULATORY_CARE_PROVIDER_SITE_OTHER): Payer: BC Managed Care – PPO

## 2022-08-23 ENCOUNTER — Telehealth: Payer: Self-pay | Admitting: Family

## 2022-08-23 ENCOUNTER — Other Ambulatory Visit: Payer: Self-pay | Admitting: Family

## 2022-08-23 DIAGNOSIS — E539 Vitamin B deficiency, unspecified: Secondary | ICD-10-CM | POA: Diagnosis not present

## 2022-08-23 DIAGNOSIS — R7989 Other specified abnormal findings of blood chemistry: Secondary | ICD-10-CM

## 2022-08-23 MED ORDER — CYANOCOBALAMIN 1000 MCG/ML IJ SOLN: 1000.0000 ug | Freq: Once | INTRAMUSCULAR | Status: DC

## 2022-08-23 NOTE — Progress Notes (Signed)
Marissa Bowman is a 56 y.o. female presents to the office today for 4/4 weekly B12 injection, per physician's orders. Original order: 07/27/22: "B12 is low. I would like for her to start b12 1069mg once weekly for 4 weeks then monthly." Cyanocobalamin, 1000 mg/ml IM was administered in R  Deltoid today.Patient tolerated injection. Patient due for follow up labs/provider appt: No.  Patient next injection due: 1 month, appt made yes    Creft, TKristine GarbeL

## 2022-08-23 NOTE — Telephone Encounter (Signed)
error 

## 2022-09-22 ENCOUNTER — Ambulatory Visit (INDEPENDENT_AMBULATORY_CARE_PROVIDER_SITE_OTHER): Payer: BC Managed Care – PPO

## 2022-09-22 DIAGNOSIS — E538 Deficiency of other specified B group vitamins: Secondary | ICD-10-CM

## 2022-09-22 MED ORDER — CYANOCOBALAMIN 1000 MCG/ML IJ SOLN
1000.0000 ug | Freq: Once | INTRAMUSCULAR | Status: AC
  Administered 2022-09-22: 1000 ug via INTRAMUSCULAR

## 2022-09-22 NOTE — Progress Notes (Signed)
Marissa Bowman is a 56 y.o. female presents to the office today for Monthly B12 injection, per physician's orders. Original order: "B12 is low. I would like for her to start b12 1036mg once weekly for 4 weeks then monthly." Cyanocobalamin, 1000 mg/ml IM was administered in L  Deltoid today.Patient tolerated injection. Patient due for follow up labs/provider appt: No.  Patient next injection due: 1 month, appt made yes   Creft, TDarlis Loan  Dr BCharlett Blake DOD

## 2022-09-26 MED ORDER — CYANOCOBALAMIN 1000 MCG/ML IJ SOLN
1000.0000 ug | Freq: Once | INTRAMUSCULAR | Status: AC
  Administered 2022-08-23: 1000 ug via INTRAMUSCULAR

## 2022-09-26 NOTE — Addendum Note (Signed)
Addended by: Legrand Rams on: 09/26/2022 09:52 AM   Modules accepted: Orders

## 2022-10-23 ENCOUNTER — Other Ambulatory Visit: Payer: Self-pay | Admitting: Gastroenterology

## 2022-10-23 ENCOUNTER — Other Ambulatory Visit: Payer: Self-pay | Admitting: Family

## 2022-10-25 ENCOUNTER — Ambulatory Visit: Payer: BC Managed Care – PPO

## 2022-10-26 ENCOUNTER — Ambulatory Visit: Payer: BC Managed Care – PPO

## 2022-11-02 ENCOUNTER — Ambulatory Visit (INDEPENDENT_AMBULATORY_CARE_PROVIDER_SITE_OTHER): Payer: BC Managed Care – PPO

## 2022-11-02 DIAGNOSIS — E538 Deficiency of other specified B group vitamins: Secondary | ICD-10-CM

## 2022-11-02 MED ORDER — CYANOCOBALAMIN 1000 MCG/ML IJ SOLN
1000.0000 ug | Freq: Once | INTRAMUSCULAR | Status: AC
  Administered 2022-11-02: 1000 ug via INTRAMUSCULAR

## 2022-11-02 NOTE — Progress Notes (Addendum)
Marissa Bowman is a 57 y.o. female presents to the office today for Monthly B12 injection, per physician's orders. Original order: "B12 is low. I would like for her to start b12 1039mg once weekly for 4 weeks then monthly." Cyanocobalamin, 1000 mg/ml IM was administered in R Deltoid today.Patient tolerated injection. Patient due for follow up labs/provider appt: No.  Patient next injection due: 1 month, appt made yes, pt would like to get next B12 at next office visit 11/25/2022  JPetra KubaCMA

## 2022-11-17 ENCOUNTER — Encounter: Payer: Self-pay | Admitting: Family

## 2022-11-22 ENCOUNTER — Ambulatory Visit: Payer: BC Managed Care – PPO | Admitting: Pharmacist

## 2022-11-22 DIAGNOSIS — I10 Essential (primary) hypertension: Secondary | ICD-10-CM

## 2022-11-22 NOTE — Patient Instructions (Signed)
Blood pressure goal is less than 140/90  Recommend check blood pressure prior to upcoming appointment  11/25/2022  I checked your [place] and losartan is tier 1 - per [facility] cost for 30 days would be $9.10. If this is not a price that is agreeable, let me know and we can look for other options.     Low-Sodium Eating Plan Sodium, which is an element that makes up salt, helps you maintain a healthy balance of fluids in your body. Too much sodium can increase your blood pressure and cause fluid and waste to be held in your body. Your health care provider or dietitian may recommend following this plan if you have high blood pressure (hypertension), kidney disease, liver disease, or heart failure. Eating less sodium can help lower your blood pressure, reduce swelling, and protect your heart, liver, and kidneys. What are tips for following this plan? Reading food labels The Nutrition Facts label lists the amount of sodium in one serving of the food. If you eat more than one serving, you must multiply the listed amount of sodium by the number of servings. Choose foods with less than 140 mg of sodium per serving. Avoid foods with 300 mg of sodium or more per serving. Shopping  Look for lower-sodium products, often labeled as "low-sodium" or "no salt added." Always check the sodium content, even if foods are labeled as "unsalted" or "no salt added." Buy fresh foods. Avoid canned foods and pre-made or frozen meals. Avoid canned, cured, or processed meats. Buy breads that have less than 80 mg of sodium per slice. Cooking  Eat more home-cooked food and less restaurant, buffet, and fast food. Avoid adding salt when cooking. Use salt-free seasonings or herbs instead of table salt or sea salt. Check with your health care provider or pharmacist before using salt substitutes. Cook with plant-based oils, such as canola, sunflower, or olive oil. Meal planning When eating at a restaurant, ask  that your food be prepared with less salt or no salt, if possible. Avoid dishes labeled as brined, pickled, cured, smoked, or made with soy sauce, miso, or teriyaki sauce. Avoid foods that contain MSG (monosodium glutamate). MSG is sometimes added to [place] food, bouillon, and some canned foods. Make meals that can be grilled, baked, poached, roasted, or steamed. These are generally made with less sodium. General information Most people on this plan should limit their sodium intake to 1,500-2,000 mg (milligrams) of sodium each day. What foods should I eat? Fruits Fresh, frozen, or canned fruit. Fruit juice. Vegetables Fresh or frozen vegetables. "No salt added" canned vegetables. "No salt added" tomato sauce and paste. Low-sodium or reduced-sodium tomato and vegetable juice. Grains Low-sodium cereals, including oats, puffed wheat and rice, and shredded wheat. Low-sodium crackers. Unsalted rice. Unsalted pasta. Low-sodium bread. Whole-grain breads and whole-grain pasta. Meats and other proteins Fresh or frozen (no salt added) meat, poultry, seafood, and fish. Low-sodium canned tuna and salmon. Unsalted nuts. Dried peas, beans, and lentils without added salt. Unsalted canned beans. Eggs. Unsalted nut butters. Dairy Milk. Soy milk. Cheese that is naturally low in sodium, such as ricotta cheese, fresh mozzarella, or Swiss cheese. Low-sodium or reduced-sodium cheese. Cream cheese. Yogurt. Seasonings and condiments Fresh and dried herbs and spices. Salt-free seasonings. Low-sodium mustard and ketchup. Sodium-free salad dressing. Sodium-free light mayonnaise. Fresh or refrigerated horseradish. Lemon juice. Vinegar. Other foods Homemade, reduced-sodium, or low-sodium soups. Unsalted popcorn and pretzels. Low-salt or salt-free chips. The items listed above may not be a  complete list of foods and beverages you can eat. Contact a dietitian for more information. What foods should I  avoid? Vegetables Sauerkraut, pickled vegetables, and relishes. Olives. [place] fries. Onion rings. Regular canned vegetables (not low-sodium or reduced-sodium). Regular canned tomato sauce and paste (not low-sodium or reduced-sodium). Regular tomato and vegetable juice (not low-sodium or reduced-sodium). Frozen vegetables in sauces. Grains Instant hot cereals. Bread stuffing, pancake, and biscuit mixes. Croutons. Seasoned rice or pasta mixes. Noodle soup cups. Boxed or frozen macaroni and cheese. Regular salted crackers. Self-rising flour. Meats and other proteins Meat or fish that is salted, canned, smoked, spiced, or pickled. Precooked or cured meat, such as sausages or meat loaves. Berniece Salines. Ham. Pepperoni. Hot dogs. Corned beef. Chipped beef. Salt pork. Jerky. Pickled herring. Anchovies and sardines. Regular canned tuna. Salted nuts. Dairy Processed cheese and cheese spreads. Hard cheeses. Cheese curds. Blue cheese. Feta cheese. String cheese. Regular cottage cheese. Buttermilk. Canned milk. Fats and oils Salted butter. Regular margarine. Ghee. Bacon fat. Seasonings and condiments Onion salt, garlic salt, seasoned salt, table salt, and sea salt. Canned and packaged gravies. Worcestershire sauce. Tartar sauce. Barbecue sauce. Teriyaki sauce. Soy sauce, including reduced-sodium. Steak sauce. Fish sauce. Oyster sauce. Cocktail sauce. Horseradish that you find on the shelf. Regular ketchup and mustard. Meat flavorings and tenderizers. Bouillon cubes. Hot sauce. Pre-made or packaged marinades. Pre-made or packaged taco seasonings. Relishes. Regular salad dressings. Salsa. Other foods Salted popcorn and pretzels. Corn chips and puffs. Potato and tortilla chips. Canned or dried soups. Pizza. Frozen entrees and pot pies. The items listed above may not be a complete list of foods and beverages you should avoid. Contact a dietitian for more information. Summary Eating less sodium can help lower your blood  pressure, reduce swelling, and protect your heart, liver, and kidneys. Most people on this plan should limit their sodium intake to 1,500-2,000 mg (milligrams) of sodium each day. Canned, boxed, and frozen foods are high in sodium. Restaurant foods, fast foods, and pizza are also very high in sodium. You also get sodium by adding salt to food. Try to cook at home, eat more fresh fruits and vegetables, and eat less fast food and canned, processed, or prepared foods. This information is not intended to replace advice given to you by your health care provider. Make sure you discuss any questions you have with your health care provider. Document Revised: 11/08/2019 Document Reviewed: 09/04/2019 Elsevier Patient Education  [place].

## 2022-11-22 NOTE — Progress Notes (Signed)
Patient appearing on report for [facility] - Hypertension Control report due to last documented ambulatory blood pressure of 146/83 on 07/26/2022. Next appointment with PCP is 11/25/2022   Outreached patient to discuss hypertension control and medication management.   Current antihypertensives: carvedilol 6.'25mg'$  twice a day; hydrochlorothiazide '25mg'$  daily and losartan '50mg'$  daily (patient did not start losartan due to cost in 2023)  Patient has an automated upper arm home BP machine.  Current blood pressure readings: not currently checking  Current meal patterns:  avoids fried foods and soda Limits canned foods and sodium.   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 She does report that she continues to have fatigue even after starting B12 injecitons and taking iron supplement twice a day. Patient denies hypertensive symptoms including no headache, chest pain, shortness of breath.   Assessment/Plan: Hypertension - last blood pressure was not at goal of < 140/90  - Reviewed goal blood pressure <140/90 - Reviewed appropriate administration of medication regimen - Reviewed to check blood pressure daily, document, and provide at next provider visit - Discussed insurance coverage losartan. [place] and losartan is tier 1. Per [facility] #30 tablets would cost $9.10  All other ARBs are tier 1 are valsartan, olmesartan, telmisartan, irbesartan and candesartan.   - Discussed dietary modifications, such as reduced salt intake, focus on whole grains, vegetables, lean proteins - patient to follow up with PCP as planned 11/25/2022  Cherre Robins, PharmD Clinical Pharmacist [facility] [place] [facility]

## 2022-11-25 ENCOUNTER — Encounter: Payer: Self-pay | Admitting: Family

## 2022-11-25 ENCOUNTER — Ambulatory Visit: Payer: BC Managed Care – PPO | Admitting: Family

## 2022-11-25 VITALS — BP 160/85 | HR 59 | Temp 98.1°F | Resp 16 | Wt 291.0 lb

## 2022-11-25 DIAGNOSIS — I10 Essential (primary) hypertension: Secondary | ICD-10-CM

## 2022-11-25 DIAGNOSIS — U071 COVID-19: Secondary | ICD-10-CM | POA: Insufficient documentation

## 2022-11-25 DIAGNOSIS — R059 Cough, unspecified: Secondary | ICD-10-CM | POA: Diagnosis not present

## 2022-11-25 DIAGNOSIS — E538 Deficiency of other specified B group vitamins: Secondary | ICD-10-CM

## 2022-11-25 LAB — POC COVID19 BINAXNOW: SARS Coronavirus 2 Ag: POSITIVE — AB

## 2022-11-25 NOTE — Assessment & Plan Note (Signed)
Check follow up b12 level. Especially due to c/o hand numbness (though that could be due to carpal tunnel).

## 2022-11-25 NOTE — Progress Notes (Signed)
Subjective:   By signing my name below, I, [name], attest that this documentation has been prepared under the direction and in the presence of [name], NP. 11/25/2022.   Patient ID: Marissa Bowman, female    DOB: February 11, 1966, 57 y.o.   MRN: [id number]  Chief Complaint  Patient presents with   Hypertension    Here for follow up reports not starting losartan    Nasal Congestion    Complains of nasal congestion   Cough    Complains of having a cough    HPI Patient is in today for an office visit. She is accompanied by her mother, who is also a patient of mine.   New Cold: She initially felt ill last Tuesday. She also complains of congestion, cough, and rhinorrhea. We will test for COVID today.  Blood Pressure: In clinic today her blood pressure is elevated at 160/85. She has not yet picked up her new prescription of losartan, but she believes it is ready at the pharmacy. BP Readings from Last 3 Encounters:  11/25/22 (!) 160/85  07/26/22 (!) 146/83  04/26/22 118/88   B12 Injection: Last received 11/02/22. She denies noticing any significant changes in her energy levels.  Numbness: Additionally she complains of a constant numbness in her left hand.  Denies having any fever, new muscle pain, joint pain, new moles, sinus pain, sore throat, chest pain, palpitations, SOB, wheezing, n/v/d, constipation, blood in stool, dysuria, frequency, hematuria, at this time.  Past Medical History:  Diagnosis Date   Anemia    on iron   Asthma    childhood   Hypertension     Past Surgical History:  Procedure Laterality Date   BIOPSY  10/12/2021   Procedure: BIOPSY;  Surgeon: Mauri Pole, MD;  Location: [facility];  Service: Endoscopy;;   COLONOSCOPY     COLONOSCOPY WITH PROPOFOL N/A 02/03/2017   Procedure: COLONOSCOPY WITH PROPOFOL;  Surgeon: Mauri Pole, MD;  Location: [facility];  Service: Endoscopy;  Laterality: N/A;   COLONOSCOPY WITH PROPOFOL N/A  10/12/2021   Procedure: COLONOSCOPY WITH PROPOFOL;  Surgeon: Mauri Pole, MD;  Location: [facility];  Service: Endoscopy;  Laterality: N/A;   ESOPHAGOGASTRODUODENOSCOPY (EGD) WITH PROPOFOL N/A 10/12/2021   Procedure: ESOPHAGOGASTRODUODENOSCOPY (EGD) WITH PROPOFOL;  Surgeon: Mauri Pole, MD;  Location: [facility];  Service: Endoscopy;  Laterality: N/A;   NO PAST SURGERIES     POLYPECTOMY  10/12/2021   Procedure: POLYPECTOMY;  Surgeon: Mauri Pole, MD;  Location: [facility];  Service: Endoscopy;;    Family History  Problem Relation Age of Onset   Hypertension Mother    Hyperlipidemia Mother    Diabetes Mother    Lung cancer Father    Thyroid disease Sister    Fibromyalgia Sister    Colon cancer Neg Hx     Social History   Socioeconomic History   Marital status: Single    Spouse name: Not on file   Number of children: Not on file   Years of education: Not on file   Highest education level: Not on file  Occupational History   Not on file  Tobacco Use   Smoking status: Never   Smokeless tobacco: Never  Vaping Use   Vaping Use: Never used  Substance and Sexual Activity   Alcohol use: No   Drug use: No   Sexual activity: Not on file  Other Topics Concern   Not on file  Social History Narrative  1 son- age 31   Unemployed, plans to sub with the [employer]   Associated degree   Lives with mother father/father and son   Has 1 dog lab/spaniel mix   Enjoys reading   Social Determinants of Health   Financial Resource Strain: Not on file  Food Insecurity: Not on file  Transportation Needs: Not on file  Physical Activity: Not on file  Stress: Not on file  Social Connections: Not on file  Intimate Partner Violence: Not on file    Outpatient Medications Prior to Visit  Medication Sig Dispense Refill   carvedilol (COREG) 6.25 MG tablet TAKE ONE TABLET BY MOUTH TWICE A DAY WITH A MEAL 60 tablet 3   ferrous sulfate 325 (65 FE) MG tablet Take 1  tablet (325 mg total) by mouth 2 (two) times daily with a meal. 60 tablet 2   hydrochlorothiazide (HYDRODIURIL) 25 MG tablet TAKE ONE TABLET BY MOUTH ONE TIME DAILY 90 tablet 1   lansoprazole (PREVACID) 30 MG capsule TAKE ONE CAPSULE BY MOUTH ONE TIME DAILY 30 capsule 11   potassium chloride (KLOR-CON) 10 MEQ tablet TAKE ONE TABLET BY MOUTH ONE TIME DAILY 90 tablet 30   losartan (COZAAR) 50 MG tablet Take 1 tablet (50 mg total) by mouth daily. 30 tablet 2   No facility-administered medications prior to visit.    Allergies  Allergen Reactions   Latex Rash    Latex gloves with powder cause a rash    Review of Systems  Constitutional:  Negative for fever.  HENT:  Positive for congestion. Negative for sinus pain and sore throat.   Respiratory:  Positive for cough. Negative for shortness of breath and wheezing.   Cardiovascular:  Negative for chest pain and palpitations.  Gastrointestinal:  Negative for blood in stool, constipation, diarrhea, nausea and vomiting.  Genitourinary:  Negative for dysuria, frequency and hematuria.  Musculoskeletal:  Negative for joint pain and myalgias.       Objective:    Physical Exam Constitutional:      Appearance: Normal appearance.  HENT:     Head: Normocephalic and atraumatic.     Right Ear: Tympanic membrane, ear canal and external ear normal.     Left Ear: Tympanic membrane, ear canal and external ear normal.  Eyes:     Extraocular Movements: Extraocular movements intact.     Pupils: Pupils are equal, round, and reactive to light.  Cardiovascular:     Rate and Rhythm: Normal rate and regular rhythm.     Heart sounds: Normal heart sounds. No murmur heard.    No gallop.  Pulmonary:     Effort: Pulmonary effort is normal. No respiratory distress.     Breath sounds: Normal breath sounds. No wheezing or rales.  Skin:    General: Skin is warm and dry.  Neurological:     General: No focal deficit present.     Mental Status: She is alert and  oriented to person, place, and time.  Psychiatric:        Mood and Affect: Mood normal.        Behavior: Behavior normal.     BP (!) 160/85 (BP Location: Right Arm, Patient Position: Sitting, Cuff Size: Large)   Pulse (!) 59   Temp 98.1 F (36.7 C) (Oral)   Resp 16   Wt 291 lb (132 kg)   LMP 01/27/2017   SpO2 100%   BMI 49.95 kg/m  Wt Readings from Last 3 Encounters:  11/25/22 291  lb (132 kg)  07/26/22 294 lb (133.4 kg)  04/26/22 (!) 301 lb 3.2 oz (136.6 kg)         Assessment & Plan:   Problem List Items Addressed This Visit       Unprioritized   HTN (hypertension)    BP is uncontrolled. She has not yet picked up/started her prescription for valsartan. Start valsartan. Continue carvedilol, hctz. Follow up in 2 weeks for bp recheck and bmet.      Relevant Orders   Basic Metabolic Panel (BMET)   [id number]    New.  Advised of CDC guidelines for self isolation/ ending isolation.  Advised of safe practice guidelines. Symptom Tier reviewed.  Encouraged to monitor for any worsening symptoms; watch for increased shortness of breath, weakness, and signs of dehydration. Advised when to seek emergency care.  Instructed to rest and hydrate well.  Advised to leave the house during recommended isolation period, only if it is necessary to seek medical care       B12 deficiency - Primary    Check follow up b12 level. Especially due to c/o hand numbness (though that could be due to carpal tunnel).       Relevant Orders   B12   Other Visit Diagnoses     Cough, unspecified type       Relevant Orders   POC COVID-19 (Completed)        No orders of the defined types were placed in this encounter.   I, [name], NP, personally preformed the services described in this documentation.  All medical record entries made by the scribe were at my direction and in my presence.  I have reviewed the chart and discharge instructions (if applicable) and agree that the record  reflects my personal performance and is accurate and complete. 11/25/2022.  I,Mathew Stumpf,acting as a Education administrator for [employer], NP.,have documented all relevant documentation on the behalf of [name], NP,as directed by  [name], NP while in the presence of [name], NP.   [name], NP

## 2022-11-25 NOTE — Assessment & Plan Note (Addendum)
New.  Advised of CDC guidelines for self isolation/ ending isolation.  Advised of safe practice guidelines. Symptom Tier reviewed.  Encouraged to monitor for any worsening symptoms; watch for increased shortness of breath, weakness, and signs of dehydration. Advised when to seek emergency care.  Instructed to rest and hydrate well.  Advised to leave the house during recommended isolation period, only if it is necessary to seek medical care

## 2022-11-25 NOTE — Assessment & Plan Note (Addendum)
BP is uncontrolled. She has not yet picked up/started her prescription for valsartan. Start valsartan. Continue carvedilol, hctz. Follow up in 2 weeks for bp recheck and bmet.

## 2022-11-26 LAB — BASIC METABOLIC PANEL
BUN: 21 mg/dL (ref 7–25)
CO2: 29 mmol/L (ref 20–32)
Calcium: 8.9 mg/dL (ref 8.6–10.4)
Chloride: 99 mmol/L (ref 98–110)
Creat: 0.96 mg/dL (ref 0.50–1.03)
Glucose, Bld: 110 mg/dL — ABNORMAL HIGH (ref 65–99)
Potassium: 3.5 mmol/L (ref 3.5–5.3)
Sodium: 143 mmol/L (ref 135–146)

## 2022-11-26 LAB — VITAMIN B12: Vitamin B-12: 930 pg/mL (ref 200–1100)

## 2022-12-09 ENCOUNTER — Ambulatory Visit (INDEPENDENT_AMBULATORY_CARE_PROVIDER_SITE_OTHER): Payer: BC Managed Care – PPO | Admitting: Family

## 2022-12-09 VITALS — BP 137/66 | HR 60 | Temp 98.0°F | Resp 16 | Wt 299.0 lb

## 2022-12-09 DIAGNOSIS — I10 Essential (primary) hypertension: Secondary | ICD-10-CM

## 2022-12-09 DIAGNOSIS — G5602 Carpal tunnel syndrome, left upper limb: Secondary | ICD-10-CM

## 2022-12-09 DIAGNOSIS — E538 Deficiency of other specified B group vitamins: Secondary | ICD-10-CM | POA: Diagnosis not present

## 2022-12-09 MED ORDER — MELOXICAM 7.5 MG PO TABS: 7.5000 mg | ORAL_TABLET | Freq: Every day | ORAL | 0 refills | Status: DC

## 2022-12-09 NOTE — Progress Notes (Signed)
Subjective:   By signing my name below, I, Marlana Latus, attest that this documentation has been prepared under the direction and in the presence of [name], NP 12/11/22   Patient ID: Marissa Bowman, female    DOB: 1966/09/24, 57 y.o.   MRN: [id number]  Chief Complaint  Patient presents with   Hypertension    Here for follow up   Numbness    Still having left hand numbness    HPI Patient is in today for a follow up.   Left Hand: She reports tingling and swelling in her left hand. She denies any pain in her wrist. She notes she does not type on the computer much.   Losartan: She has been compliant with losartan, 50 mg daily. It has been helping her.   B12 Deficiency: She has been managing her B12 well.   Past Medical History:  Diagnosis Date   Anemia    on iron   Asthma    childhood   Hypertension     Past Surgical History:  Procedure Laterality Date   BIOPSY  10/12/2021   Procedure: BIOPSY;  Surgeon: Mauri Pole, MD;  Location: [facility];  Service: Endoscopy;;   COLONOSCOPY     COLONOSCOPY WITH PROPOFOL N/A 02/03/2017   Procedure: COLONOSCOPY WITH PROPOFOL;  Surgeon: Mauri Pole, MD;  Location: [facility];  Service: Endoscopy;  Laterality: N/A;   COLONOSCOPY WITH PROPOFOL N/A 10/12/2021   Procedure: COLONOSCOPY WITH PROPOFOL;  Surgeon: Mauri Pole, MD;  Location: [facility];  Service: Endoscopy;  Laterality: N/A;   ESOPHAGOGASTRODUODENOSCOPY (EGD) WITH PROPOFOL N/A 10/12/2021   Procedure: ESOPHAGOGASTRODUODENOSCOPY (EGD) WITH PROPOFOL;  Surgeon: Mauri Pole, MD;  Location: [facility];  Service: Endoscopy;  Laterality: N/A;   NO PAST SURGERIES     POLYPECTOMY  10/12/2021   Procedure: POLYPECTOMY;  Surgeon: Mauri Pole, MD;  Location: [facility];  Service: Endoscopy;;    Family History  Problem Relation Age of Onset   Hypertension Mother    Hyperlipidemia Mother    Diabetes Mother    Lung cancer Father     Thyroid disease Sister    Fibromyalgia Sister    Colon cancer Neg Hx     Social History   Socioeconomic History   Marital status: Single    Spouse name: Not on file   Number of children: Not on file   Years of education: Not on file   Highest education level: Not on file  Occupational History   Not on file  Tobacco Use   Smoking status: Never   Smokeless tobacco: Never  Vaping Use   Vaping Use: Never used  Substance and Sexual Activity   Alcohol use: No   Drug use: No   Sexual activity: Not on file  Other Topics Concern   Not on file  Social History Narrative   1 son- age 10   Unemployed, plans to sub with the [employer]   Associated degree   Lives with mother father/father and son   Has 1 dog lab/spaniel mix   Enjoys reading   Social Determinants of Health   Financial Resource Strain: Not on file  Food Insecurity: Not on file  Transportation Needs: Not on file  Physical Activity: Not on file  Stress: Not on file  Social Connections: Not on file  Intimate Partner Violence: Not on file    Outpatient Medications Prior to Visit  Medication Sig Dispense Refill   carvedilol (COREG) 6.25  MG tablet TAKE ONE TABLET BY MOUTH TWICE A DAY WITH A MEAL 60 tablet 3   hydrochlorothiazide (HYDRODIURIL) 25 MG tablet TAKE ONE TABLET BY MOUTH ONE TIME DAILY 90 tablet 1   lansoprazole (PREVACID) 30 MG capsule TAKE ONE CAPSULE BY MOUTH ONE TIME DAILY 30 capsule 11   losartan (COZAAR) 50 MG tablet Take 1 tablet (50 mg total) by mouth daily. 30 tablet 2   potassium chloride (KLOR-CON) 10 MEQ tablet TAKE ONE TABLET BY MOUTH ONE TIME DAILY 90 tablet 30   ferrous sulfate 325 (65 FE) MG tablet Take 1 tablet (325 mg total) by mouth 2 (two) times daily with a meal. 60 tablet 2   No facility-administered medications prior to visit.    Allergies  Allergen Reactions   Latex Rash    Latex gloves with powder cause a rash    Review of Systems  Neurological:  Positive for tingling  (left hand- (+)tingling and swelling).       Objective:    Physical Exam Constitutional:      General: She is awake. She is not in acute distress. HENT:     Head: Normocephalic and atraumatic.  Eyes:     Pupils: Pupils are equal, round, and reactive to light.  Pulmonary:     Effort: Pulmonary effort is normal.  Musculoskeletal:     Comments: Left hand Negative to Tinel's. Positive to Phalen's.   Skin:    General: Skin is warm and dry.  Neurological:     Mental Status: She is alert and oriented to person, place, and time.     Cranial Nerves: No facial asymmetry.  Psychiatric:        Mood and Affect: Mood normal.        Speech: Speech normal.     BP 137/66 (BP Location: Right Arm, Patient Position: Sitting, Cuff Size: Large)   Pulse 60   Temp 98 F (36.7 C) (Oral)   Resp 16   Wt 299 lb (135.6 kg)   LMP 01/27/2017   SpO2 100%   BMI 51.32 kg/m  Wt Readings from Last 3 Encounters:  12/09/22 299 lb (135.6 kg)  11/25/22 291 lb (132 kg)  07/26/22 294 lb (133.4 kg)       Assessment & Plan:  Primary hypertension Assessment & Plan: BP is improved with increase in her losartan dose. Continue same, obtain follow up bmet.   Orders: -     Basic metabolic panel  123456 deficiency Assessment & Plan: Continues monthly b12 injections.    Carpal tunnel syndrome of left wrist Assessment & Plan: New. Pt given wrist brace today and rx for short course of meloxicam. She is advised to let me know if symptoms worsen or if symptoms are not improved in a few weeks.    Other orders -     Meloxicam; Take 1 tablet (7.5 mg total) by mouth daily.  Dispense: 14 tablet; Refill: 0     I,Rachel Rivera,acting as a scribe for [name], NP.,have documented all relevant documentation on the behalf of [name], NP,as directed by  [name], NP while in the presence of [name], NP.   I, [name], NP, personally preformed the  services described in this documentation.  All medical record entries made by the scribe were at my direction and in my presence.  I have reviewed the chart and discharge instructions (if applicable) and agree that the record reflects my personal performance and  is accurate and complete. 12/11/22   [name], NP

## 2022-12-09 NOTE — Assessment & Plan Note (Signed)
BP is improved with increase in her losartan dose. Continue same, obtain follow up bmet.

## 2022-12-10 LAB — BASIC METABOLIC PANEL
BUN: 24 mg/dL (ref 7–25)
CO2: 28 mmol/L (ref 20–32)
Calcium: 8.7 mg/dL (ref 8.6–10.4)
Chloride: 100 mmol/L (ref 98–110)
Creat: 1.01 mg/dL (ref 0.50–1.03)
Glucose, Bld: 85 mg/dL (ref 65–99)
Potassium: 3.7 mmol/L (ref 3.5–5.3)
Sodium: 140 mmol/L (ref 135–146)

## 2022-12-11 DIAGNOSIS — G5602 Carpal tunnel syndrome, left upper limb: Secondary | ICD-10-CM

## 2022-12-11 HISTORY — DX: Carpal tunnel syndrome, left upper limb: G56.02

## 2022-12-11 NOTE — Assessment & Plan Note (Signed)
Continues monthly b12 injections.

## 2022-12-11 NOTE — Assessment & Plan Note (Signed)
New. Pt given wrist brace today and rx for short course of meloxicam. She is advised to let me know if symptoms worsen or if symptoms are not improved in a few weeks.

## 2022-12-20 ENCOUNTER — Other Ambulatory Visit: Payer: Self-pay | Admitting: Family

## 2023-01-05 ENCOUNTER — Other Ambulatory Visit: Payer: Self-pay | Admitting: Family

## 2023-03-07 ENCOUNTER — Other Ambulatory Visit: Payer: Self-pay | Admitting: Family

## 2023-04-06 ENCOUNTER — Other Ambulatory Visit: Payer: Self-pay | Admitting: Family

## 2023-04-10 ENCOUNTER — Ambulatory Visit (INDEPENDENT_AMBULATORY_CARE_PROVIDER_SITE_OTHER): Payer: BC Managed Care – PPO | Admitting: Family

## 2023-04-10 ENCOUNTER — Inpatient Hospital Stay (HOSPITAL_BASED_OUTPATIENT_CLINIC_OR_DEPARTMENT_OTHER)
Admission: EM | Admit: 2023-04-10 | Discharge: 2023-04-14 | DRG: 682 | Disposition: A | Discharge status: Home / Self Care | Payer: BC Managed Care – PPO | Attending: Internal Medicine | Admitting: Internal Medicine

## 2023-04-10 ENCOUNTER — Encounter (HOSPITAL_BASED_OUTPATIENT_CLINIC_OR_DEPARTMENT_OTHER): Payer: Self-pay

## 2023-04-10 ENCOUNTER — Other Ambulatory Visit: Payer: Self-pay

## 2023-04-10 ENCOUNTER — Telehealth: Payer: Self-pay | Admitting: Family

## 2023-04-10 ENCOUNTER — Emergency Department (HOSPITAL_BASED_OUTPATIENT_CLINIC_OR_DEPARTMENT_OTHER): Payer: BC Managed Care – PPO

## 2023-04-10 VITALS — BP 99/46 | HR 65 | Temp 98.3°F | Resp 18 | Wt 287.0 lb

## 2023-04-10 DIAGNOSIS — E876 Hypokalemia: Secondary | ICD-10-CM | POA: Diagnosis present

## 2023-04-10 DIAGNOSIS — Z8719 Personal history of other diseases of the digestive system: Secondary | ICD-10-CM

## 2023-04-10 DIAGNOSIS — N179 Acute kidney failure, unspecified: Secondary | ICD-10-CM | POA: Diagnosis present

## 2023-04-10 DIAGNOSIS — R634 Abnormal weight loss: Secondary | ICD-10-CM | POA: Diagnosis present

## 2023-04-10 DIAGNOSIS — E871 Hypo-osmolality and hyponatremia: Secondary | ICD-10-CM | POA: Diagnosis present

## 2023-04-10 DIAGNOSIS — Z8249 Family history of ischemic heart disease and other diseases of the circulatory system: Secondary | ICD-10-CM | POA: Diagnosis not present

## 2023-04-10 DIAGNOSIS — Z6841 Body Mass Index (BMI) 40.0 and over, adult: Secondary | ICD-10-CM | POA: Diagnosis not present

## 2023-04-10 DIAGNOSIS — K254 Chronic or unspecified gastric ulcer with hemorrhage: Secondary | ICD-10-CM | POA: Diagnosis present

## 2023-04-10 DIAGNOSIS — L03116 Cellulitis of left lower limb: Secondary | ICD-10-CM | POA: Diagnosis not present

## 2023-04-10 DIAGNOSIS — I10 Essential (primary) hypertension: Secondary | ICD-10-CM | POA: Diagnosis present

## 2023-04-10 DIAGNOSIS — E860 Dehydration: Secondary | ICD-10-CM | POA: Diagnosis present

## 2023-04-10 DIAGNOSIS — K253 Acute gastric ulcer without hemorrhage or perforation: Secondary | ICD-10-CM

## 2023-04-10 DIAGNOSIS — K2971 Gastritis, unspecified, with bleeding: Secondary | ICD-10-CM | POA: Diagnosis present

## 2023-04-10 DIAGNOSIS — Z91040 Latex allergy status: Secondary | ICD-10-CM | POA: Diagnosis not present

## 2023-04-10 DIAGNOSIS — J45909 Unspecified asthma, uncomplicated: Secondary | ICD-10-CM | POA: Diagnosis present

## 2023-04-10 DIAGNOSIS — R0602 Shortness of breath: Secondary | ICD-10-CM | POA: Diagnosis not present

## 2023-04-10 DIAGNOSIS — D500 Iron deficiency anemia secondary to blood loss (chronic): Secondary | ICD-10-CM | POA: Diagnosis present

## 2023-04-10 DIAGNOSIS — I959 Hypotension, unspecified: Secondary | ICD-10-CM | POA: Diagnosis present

## 2023-04-10 DIAGNOSIS — D638 Anemia in other chronic diseases classified elsewhere: Secondary | ICD-10-CM | POA: Diagnosis not present

## 2023-04-10 DIAGNOSIS — K209 Esophagitis, unspecified without bleeding: Secondary | ICD-10-CM | POA: Diagnosis not present

## 2023-04-10 DIAGNOSIS — K921 Melena: Secondary | ICD-10-CM | POA: Insufficient documentation

## 2023-04-10 DIAGNOSIS — Z79899 Other long term (current) drug therapy: Secondary | ICD-10-CM | POA: Diagnosis not present

## 2023-04-10 DIAGNOSIS — Z86010 Personal history of colonic polyps: Secondary | ICD-10-CM | POA: Diagnosis not present

## 2023-04-10 DIAGNOSIS — K922 Gastrointestinal hemorrhage, unspecified: Secondary | ICD-10-CM

## 2023-04-10 DIAGNOSIS — E861 Hypovolemia: Secondary | ICD-10-CM | POA: Diagnosis present

## 2023-04-10 HISTORY — DX: Melena: K92.1

## 2023-04-10 LAB — BASIC METABOLIC PANEL
Anion gap: 9 (ref 5–15)
BUN: 46 mg/dL — ABNORMAL HIGH (ref 6–20)
CO2: 23 mmol/L (ref 22–32)
Calcium: 7.9 mg/dL — ABNORMAL LOW (ref 8.9–10.3)
Chloride: 98 mmol/L (ref 98–111)
Creatinine, Ser: 2.12 mg/dL — ABNORMAL HIGH (ref 0.44–1.00)
GFR, Estimated: 26 mL/min — ABNORMAL LOW (ref >60)
Glucose, Bld: 103 mg/dL — ABNORMAL HIGH (ref 70–99)
Potassium: 3.2 mmol/L — ABNORMAL LOW (ref 3.5–5.1)
Sodium: 130 mmol/L — ABNORMAL LOW (ref 135–145)

## 2023-04-10 LAB — CBC WITH DIFFERENTIAL/PLATELET
Abs Immature Granulocytes: 0.04 10*3/uL (ref 0.00–0.07)
Basophils Absolute: 0 10*3/uL (ref 0.0–0.1)
Basophils Relative: 0 %
Eosinophils Absolute: 0.2 10*3/uL (ref 0.0–0.5)
Eosinophils Relative: 2 %
HCT: 30.6 % — ABNORMAL LOW (ref 36.0–46.0)
Hemoglobin: 10.1 g/dL — ABNORMAL LOW (ref 12.0–15.0)
Immature Granulocytes: 0 %
Lymphocytes Relative: 7 %
Lymphs Abs: 0.8 10*3/uL (ref 0.7–4.0)
MCH: 26.6 pg (ref 26.0–34.0)
MCHC: 33 g/dL (ref 30.0–36.0)
MCV: 80.5 fL (ref 80.0–100.0)
Monocytes Absolute: 0.7 10*3/uL (ref 0.1–1.0)
Monocytes Relative: 7 %
Neutro Abs: 9.2 10*3/uL — ABNORMAL HIGH (ref 1.7–7.7)
Neutrophils Relative %: 84 %
Platelets: 245 10*3/uL (ref 150–400)
RBC: 3.8 MIL/uL — ABNORMAL LOW (ref 3.87–5.11)
RDW: 14 % (ref 11.5–15.5)
WBC: 11 10*3/uL — ABNORMAL HIGH (ref 4.0–10.5)
nRBC: 0 % (ref 0.0–0.2)

## 2023-04-10 LAB — HEPATIC FUNCTION PANEL
ALT: 30 U/L (ref 0–44)
AST: 39 U/L (ref 15–41)
Albumin: 3.4 g/dL — ABNORMAL LOW (ref 3.5–5.0)
Alkaline Phosphatase: 65 U/L (ref 38–126)
Bilirubin, Direct: 0.2 mg/dL (ref 0.0–0.2)
Indirect Bilirubin: 0.4 mg/dL (ref 0.3–0.9)
Total Bilirubin: 0.6 mg/dL (ref 0.3–1.2)
Total Protein: 7.9 g/dL (ref 6.5–8.1)

## 2023-04-10 LAB — LACTIC ACID, PLASMA: Lactic Acid, Venous: 1.4 mmol/L (ref 0.5–1.9)

## 2023-04-10 LAB — MAGNESIUM: Magnesium: 2.2 mg/dL (ref 1.7–2.4)

## 2023-04-10 LAB — OCCULT BLOOD X 1 CARD TO LAB, STOOL: Fecal Occult Bld: POSITIVE — AB

## 2023-04-10 LAB — PROTIME-INR
INR: 1.2 (ref 0.8–1.2)
Prothrombin Time: 15.7 seconds — ABNORMAL HIGH (ref 11.4–15.2)

## 2023-04-10 LAB — BRAIN NATRIURETIC PEPTIDE: B Natriuretic Peptide: 26.5 pg/mL (ref 0.0–100.0)

## 2023-04-10 MED ORDER — SODIUM CHLORIDE 0.9 % IV SOLN
1.0000 g | Freq: Once | INTRAVENOUS | Status: AC
  Administered 2023-04-10: 1 g via INTRAVENOUS
  Filled 2023-04-10: qty 10

## 2023-04-10 MED ORDER — PANTOPRAZOLE SODIUM 40 MG IV SOLR
INTRAVENOUS | Status: AC
  Filled 2023-04-10: qty 40

## 2023-04-10 MED ORDER — PANTOPRAZOLE INFUSION (NEW) - SIMPLE MED
8.0000 mg/h | INTRAVENOUS | Status: DC
  Administered 2023-04-10 – 2023-04-11 (×2): 8 mg/h via INTRAVENOUS
  Filled 2023-04-10: qty 100
  Filled 2023-04-10: qty 80
  Filled 2023-04-10: qty 100

## 2023-04-10 MED ORDER — POTASSIUM CHLORIDE 10 MEQ/100ML IV SOLN
10.0000 meq | Freq: Once | INTRAVENOUS | Status: AC
  Administered 2023-04-10: 10 meq via INTRAVENOUS
  Filled 2023-04-10: qty 100

## 2023-04-10 MED ORDER — ACETAMINOPHEN 325 MG PO TABS: 650.0000 mg | ORAL_TABLET | Freq: Four times a day (QID) | ORAL | Status: DC | PRN

## 2023-04-10 MED ORDER — ALBUTEROL SULFATE (2.5 MG/3ML) 0.083% IN NEBU: 2.5000 mg | INHALATION_SOLUTION | RESPIRATORY_TRACT | Status: DC | PRN

## 2023-04-10 MED ORDER — ACETAMINOPHEN 650 MG RE SUPP: 650.0000 mg | Freq: Four times a day (QID) | RECTAL | Status: DC | PRN

## 2023-04-10 MED ORDER — PANTOPRAZOLE 80MG IVPB - SIMPLE MED
80.0000 mg | Freq: Once | INTRAVENOUS | Status: AC
  Administered 2023-04-10: 80 mg via INTRAVENOUS
  Filled 2023-04-10: qty 100

## 2023-04-10 MED ORDER — SODIUM CHLORIDE 0.9 % IV SOLN: 1.0000 g | INTRAVENOUS | Status: DC

## 2023-04-10 MED ORDER — ONDANSETRON HCL 4 MG PO TABS: 4.0000 mg | ORAL_TABLET | Freq: Four times a day (QID) | ORAL | Status: DC | PRN

## 2023-04-10 MED ORDER — TRAZODONE HCL 50 MG PO TABS: 25.0000 mg | ORAL_TABLET | Freq: Every evening | ORAL | Status: DC | PRN

## 2023-04-10 MED ORDER — SODIUM CHLORIDE 0.9 % IV BOLUS
1000.0000 mL | Freq: Once | INTRAVENOUS | Status: AC
  Administered 2023-04-10: 1000 mL via INTRAVENOUS

## 2023-04-10 MED ORDER — SODIUM CHLORIDE 0.9 % IV SOLN: INTRAVENOUS | Status: DC | PRN

## 2023-04-10 MED ORDER — ONDANSETRON HCL 4 MG/2ML IJ SOLN: 4.0000 mg | Freq: Four times a day (QID) | INTRAMUSCULAR | Status: DC | PRN

## 2023-04-10 NOTE — ED Provider Notes (Signed)
[facility] Provider Note   CSN: [id number] Arrival date & time: 04/10/23  1232     History  Chief Complaint  Patient presents with   Fatigue   Shortness of Breath    Marissa Bowman is a 57 y.o. female.  Pt is a 57 yo female with pmhx significant for asthma, anemia, and htn.  Pt said she's been very sob for the past 3 days.  She tried to go to work on Saturday, 6/22, but was too sob.  She has been having dark stools, but does take iron.  Pt has had a cough.  She has also noticed some redness to her left leg.  She went to pcp's office today.  They told her to come to the ED for further eval.         Home Medications Prior to Admission medications   Medication Sig Start Date End Date Taking? Authorizing Provider  carvedilol (COREG) 6.25 MG tablet TAKE ONE TABLET BY MOUTH TWICE A DAY WITH A MEAL 03/08/23   Sandford Craze, NP  ferrous sulfate 325 (65 FE) MG tablet Take 1 tablet (325 mg total) by mouth 2 (two) times daily with a meal. 04/28/22 12/06/22  Sandford Craze, NP  hydrochlorothiazide (HYDRODIURIL) 25 MG tablet TAKE ONE TABLET BY MOUTH ONE TIME DAILY 10/24/22   Sandford Craze, NP  lansoprazole (PREVACID) 30 MG capsule TAKE ONE CAPSULE BY MOUTH ONE TIME DAILY 10/24/22   Napoleon Form, MD  potassium chloride (KLOR-CON) 10 MEQ tablet Take 1 tablet (10 mEq total) by mouth daily. 01/05/23   Sandford Craze, NP      Allergies    Latex    Review of Systems   Review of Systems  Respiratory:  Positive for shortness of breath.   Gastrointestinal:        Dark stool  Skin:        Left leg redness and swelling  All other systems reviewed and are negative.   Physical Exam Updated Vital Signs BP (!) 112/49   Pulse (!) 56   Temp 98 F (36.7 C)   Resp 20   LMP 01/27/2017   SpO2 97%  Physical Exam Vitals and nursing note reviewed.  Constitutional:      Appearance: She is well-developed.  HENT:     Head:  Normocephalic and atraumatic.     Mouth/Throat:     Mouth: Mucous membranes are moist.  Eyes:     Pupils: Pupils are equal, round, and reactive to light.  Cardiovascular:     Rate and Rhythm: Normal rate and regular rhythm.  Pulmonary:     Effort: Pulmonary effort is normal.     Breath sounds: Normal breath sounds.  Abdominal:     General: Bowel sounds are normal.     Palpations: Abdomen is soft.  Genitourinary:    Rectum: Guaiac result positive.     Comments: Black stool Musculoskeletal:        General: Normal range of motion.     Cervical back: Normal range of motion and neck supple.  Skin:    General: Skin is warm.     Capillary Refill: Capillary refill takes less than 2 seconds.     Comments: LLE cellulitis.  See pictures.  Neurological:     General: No focal deficit present.     Mental Status: She is alert and oriented to person, place, and time.  Psychiatric:        Mood  and Affect: Mood normal.        Behavior: Behavior normal.        ED Results / Procedures / Treatments   Labs (all labs ordered are listed, but only abnormal results are displayed) Labs Reviewed  BASIC METABOLIC PANEL - Abnormal; Notable for the following components:      Result Value   Sodium 130 (*)    Potassium 3.2 (*)    Glucose, Bld 103 (*)    BUN 46 (*)    Creatinine, Ser 2.12 (*)    Calcium 7.9 (*)    GFR, Estimated 26 (*)    All other components within normal limits  CBC WITH DIFFERENTIAL/PLATELET - Abnormal; Notable for the following components:   WBC 11.0 (*)    RBC 3.80 (*)    Hemoglobin 10.1 (*)    HCT 30.6 (*)    Neutro Abs 9.2 (*)    All other components within normal limits  HEPATIC FUNCTION PANEL - Abnormal; Notable for the following components:   Albumin 3.4 (*)    All other components within normal limits  OCCULT BLOOD X 1 CARD TO LAB, STOOL - Abnormal; Notable for the following components:   Fecal Occult Bld POSITIVE (*)    All other components within normal limits   PROTIME-INR - Abnormal; Notable for the following components:   Prothrombin Time 15.7 (*)    All other components within normal limits  CULTURE, BLOOD (ROUTINE X 2)  CULTURE, BLOOD (ROUTINE X 2)  BRAIN NATRIURETIC PEPTIDE  LACTIC ACID, PLASMA  MAGNESIUM    EKG EKG Interpretation  Date/Time:  Monday April 10 2023 13:16:58 EDT Ventricular Rate:  63 PR Interval:  174 QRS Duration: 111 QT Interval:  414 QTC Calculation: 424 R Axis:   19 Text Interpretation: Sinus rhythm Low voltage, precordial leads No significant change since last tracing Confirmed by Jacalyn Lefevre [phone]) on 04/10/2023 1:21:52 PM  Radiology DG Chest Port 1 View  Result Date: 04/10/2023 CLINICAL DATA:  Shortness of breath. EXAM: PORTABLE CHEST 1 VIEW COMPARISON:  Radiograph dated January 28, 2020 FINDINGS: The heart size and mediastinal contours are within normal limits. Both lungs are clear. The visualized skeletal structures are unremarkable. IMPRESSION: No active disease. Electronically Signed   By: Larose Hires D.O.   On: 04/10/2023 13:28    Procedures Procedures    Medications Ordered in ED Medications  pantoprozole (PROTONIX) 80 mg /NS 100 mL infusion (8 mg/hr Intravenous New Bag/Given 04/10/23 1439)  pantoprazole (PROTONIX) 40 MG injection (  Canceled Entry 04/10/23 1439)  potassium chloride 10 mEq in 100 mL IVPB (has no administration in time range)  0.9 %  sodium chloride infusion (has no administration in time range)  cefTRIAXone (ROCEPHIN) 1 g in sodium chloride 0.9 % 100 mL IVPB (1 g Intravenous New Bag/Given 04/10/23 1329)  pantoprazole (PROTONIX) 80 mg /NS 100 mL IVPB (0 mg Intravenous Stopped 04/10/23 1438)  sodium chloride 0.9 % bolus 1,000 mL (1,000 mLs Intravenous New Bag/Given 04/10/23 1414)    ED Course/ Medical Decision Making/ A&P                             Medical Decision Making Amount and/or Complexity of Data Reviewed Labs: ordered. Radiology: ordered.  Risk Prescription drug  management. Decision regarding hospitalization.   This patient presents to the ED for concern of sob, this involves an extensive number of treatment options, and is a complaint that  carries with it a high risk of complications and morbidity.  The differential diagnosis includes anemia, pna, infection   Co morbidities that complicate the patient evaluation  asthma, anemia, and htn   Additional history obtained:  Additional history obtained from epic chart review External records from outside source obtained and reviewed including family   Lab Tests:  I Ordered, and personally interpreted labs.  The pertinent results include:  cbc with hgb 10.1 (stable); bmp with na low at 130 and k low at 3.2 and bun 46 and cr 2.12 (bun 24 and 1.01)   Imaging Studies ordered:  I ordered imaging studies including cxr and [place] I independently visualized and interpreted imaging which showed CXR: No active disease.  [place]:  pending upon admission I agree with the radiologist interpretation   Cardiac Monitoring:  The patient was maintained on a cardiac monitor.  I personally viewed and interpreted the cardiac monitored which showed an underlying rhythm of: nsr   Medicines ordered and prescription drug management:  I ordered medication including ivfs  for hypotension  Reevaluation of the patient after these medicines showed that the patient improved I have reviewed the patients home medicines and have made adjustments as needed   Test Considered:  [place]   Critical Interventions:  ivfs   Consultations Obtained:  I requested consultation with the hospitalist (Dr. Nelson Chimes),  and discussed lab and imaging findings as well as pertinent plan - he will admit.  He requests GI consult. Pt d/w [facility] PA and they will put pt on their list for consult.   Problem List / ED Course:  + fobt:  protonix started.  Hgb stable.  GI consulted. Hypotension with aki:  pt given ivfs Cellulitis left leg: rocephin  given Hypokalemia:  kcl given.  Mg nl   Reevaluation:  After the interventions noted above, I reevaluated the patient and found that they have :improved   Social Determinants of Health:  Lives at home   Dispostion:  After consideration of the diagnostic results and the patients response to treatment, I feel that the patent would benefit from admission.          Final Clinical Impression(s) / ED Diagnoses Final diagnoses:  Cellulitis of left lower extremity  Gastrointestinal hemorrhage, unspecified gastrointestinal hemorrhage type  AKI (acute kidney injury) [facility])    Rx / DC Orders ED Discharge Orders     None         Jacalyn Lefevre, MD 04/10/23 1523

## 2023-04-10 NOTE — Assessment & Plan Note (Signed)
Advised pt to remain off of losartan for now.

## 2023-04-10 NOTE — ED Notes (Signed)
Pt with request to not have PIV placed in either hand.  RN attempted to find suitable site for 2nd PIV, unsuccessful.  EDP [place] reports pt does not need 2nd large bore IV at this time.  Call bell within reach, will continue to monitor.

## 2023-04-10 NOTE — Progress Notes (Signed)
Subjective:     Patient ID: Marissa Bowman, female    DOB: [birth year], 57 y.o.   MRN: [id number]  Chief Complaint  Patient presents with   Shortness of Breath    Complains of sob for the last 3 days   Shoulder Pain    Complains of bilateral shoulder pain for the past 3 days    HPI  Discussed the use of AI scribe software for clinical note transcription with the patient, who gave verbal consent to proceed.  History of Present Illness   The patient, with a history of hypertension managed with losartan, presents with new onset shortness of breath that began on Saturday. She describes feeling lightheaded and unwell, which led her to leave work early. She denies chest pain and palpitations, but reports pain in the neck and between the shoulder blades. The shortness of breath is worse when lying flat compared to sitting up. She also reports black stools.   Patient has a hx of heme positive stool- had colo endo back in 2022.  Colo showed no signs of bleeding but endo showed Patchy moderate inflammation characterized by congestion ( edema) , erosions and shallow ulcerations was found in the prepyloric region of the stomach.          Health Maintenance Due  Topic Date Due   COVID-19 Vaccine (5 - 2023-24 season) 06/17/2022    Past Medical History:  Diagnosis Date   Anemia    on iron   Asthma    childhood   Hypertension     Past Surgical History:  Procedure Laterality Date   BIOPSY  10/12/2021   Procedure: BIOPSY;  Surgeon: Napoleon Form, MD;  Location: [facility];  Service: Endoscopy;;   COLONOSCOPY     COLONOSCOPY WITH PROPOFOL N/A 02/03/2017   Procedure: COLONOSCOPY WITH PROPOFOL;  Surgeon: Napoleon Form, MD;  Location: [facility];  Service: Endoscopy;  Laterality: N/A;   COLONOSCOPY WITH PROPOFOL N/A 10/12/2021   Procedure: COLONOSCOPY WITH PROPOFOL;  Surgeon: Napoleon Form, MD;  Location: [facility];  Service: Endoscopy;  Laterality: N/A;    ESOPHAGOGASTRODUODENOSCOPY (EGD) WITH PROPOFOL N/A 10/12/2021   Procedure: ESOPHAGOGASTRODUODENOSCOPY (EGD) WITH PROPOFOL;  Surgeon: Napoleon Form, MD;  Location: [facility];  Service: Endoscopy;  Laterality: N/A;   NO PAST SURGERIES     POLYPECTOMY  10/12/2021   Procedure: POLYPECTOMY;  Surgeon: Napoleon Form, MD;  Location: [facility];  Service: Endoscopy;;    Family History  Problem Relation Age of Onset   Hypertension Mother    Hyperlipidemia Mother    Diabetes Mother    Lung cancer Father    Thyroid disease Sister    Fibromyalgia Sister    Colon cancer Neg Hx     Social History   Socioeconomic History   Marital status: Single    Spouse name: Not on file   Number of children: Not on file   Years of education: Not on file   Highest education level: Associate degree: occupational, Scientist, product/process development, or vocational program  Occupational History   Not on file  Tobacco Use   Smoking status: Never   Smokeless tobacco: Never  Vaping Use   Vaping Use: Never used  Substance and Sexual Activity   Alcohol use: No   Drug use: No   Sexual activity: Not on file  Other Topics Concern   Not on file  Social History Narrative   1 son- age 59   Unemployed, plans to sub with the  [employer]   Associated degree   Lives with mother father/father and son   Has 1 dog lab/spaniel mix   Enjoys reading   Social Determinants of Health   Financial Resource Strain: Low Risk  (04/06/2023)   Overall Financial Resource Strain (CARDIA)    Difficulty of Paying Living Expenses: Not very hard  Food Insecurity: Food Insecurity Present (04/06/2023)   Hunger Vital Sign    Worried About Running Out of Food in the Last Year: Sometimes true    Ran Out of Food in the Last Year: Sometimes true  Transportation Needs: No Transportation Needs (04/06/2023)   PRAPARE - Administrator, Civil Service (Medical): No    Lack of Transportation (Non-Medical): No  Physical Activity:  Insufficiently Active (04/06/2023)   Exercise Vital Sign    Days of Exercise per Week: 4 days    Minutes of Exercise per Session: 30 min  Stress: No Stress Concern Present (04/06/2023)   [employer] of Occupational Health - Occupational Stress Questionnaire    Feeling of Stress : Not at all  Social Connections: Moderately Integrated (04/06/2023)   Social Connection and Isolation Panel [NHANES]    Frequency of Communication with Friends and Family: Once a week    Frequency of Social Gatherings with Friends and Family: Twice a week    Attends Religious Services: 1 to 4 times per year    Active Member of [employer] or Organizations: Yes    Attends Banker Meetings: 1 to 4 times per year    Marital Status: Never married  Catering manager Violence: Not on file    Outpatient Medications Prior to Visit  Medication Sig Dispense Refill   carvedilol (COREG) 6.25 MG tablet TAKE ONE TABLET BY MOUTH TWICE A DAY WITH A MEAL 60 tablet 3   hydrochlorothiazide (HYDRODIURIL) 25 MG tablet TAKE ONE TABLET BY MOUTH ONE TIME DAILY 90 tablet 1   lansoprazole (PREVACID) 30 MG capsule TAKE ONE CAPSULE BY MOUTH ONE TIME DAILY 30 capsule 11   potassium chloride (KLOR-CON) 10 MEQ tablet Take 1 tablet (10 mEq total) by mouth daily. 90 tablet 1   losartan (COZAAR) 50 MG tablet TAKE ONE TABLET BY MOUTH ONE TIME DAILY 90 tablet 1   ferrous sulfate 325 (65 FE) MG tablet Take 1 tablet (325 mg total) by mouth 2 (two) times daily with a meal. 60 tablet 2   No facility-administered medications prior to visit.    Allergies  Allergen Reactions   Latex Rash    Latex gloves with powder cause a rash    ROS    See HPI Objective:    Physical Exam Constitutional:      General: She is not in acute distress.    Appearance: Normal appearance. She is well-developed.     Comments: Weak appearing compared to baseline  HENT:     Head: Normocephalic and atraumatic.     Right Ear: External ear normal.     Left  Ear: External ear normal.  Eyes:     General: No scleral icterus.    Comments: Pale conjunctiva noted  Neck:     Thyroid: No thyromegaly.  Cardiovascular:     Rate and Rhythm: Normal rate and regular rhythm.     Heart sounds: Normal heart sounds. No murmur heard. Pulmonary:     Effort: Pulmonary effort is normal. No respiratory distress.     Breath sounds: Normal breath sounds. No wheezing.  Musculoskeletal:     Cervical  back: Neck supple.  Skin:    General: Skin is warm and dry.  Neurological:     Mental Status: She is alert and oriented to person, place, and time.  Psychiatric:        Mood and Affect: Mood normal.        Behavior: Behavior normal.        Thought Content: Thought content normal.        Judgment: Judgment normal.      BP (!) 99/46 (BP Location: Right Arm, Patient Position: Sitting, Cuff Size: Large)   Pulse 65   Temp 98.3 F (36.8 C) (Oral)   Resp 18   Wt 287 lb (130.2 kg)   LMP 01/27/2017   SpO2 98%   BMI 49.26 kg/m  Wt Readings from Last 3 Encounters:  04/10/23 287 lb (130.2 kg)  12/09/22 299 lb (135.6 kg)  11/25/22 291 lb (132 kg)       Assessment & Plan:   Problem List Items Addressed This Visit       Unprioritized   SOB (shortness of breath) - Primary    ? Due to anemia. Defer management to ED.  EKG tracing is personally reviewed.  EKG notes NSR and changes consistent with LVH.  No acute changes.        Relevant Orders   EKG 12-Lead (Completed)   ECHOCARDIOGRAM COMPLETE   Melena    With hx and presentation- I expect pt is very anemic.  I have advised her to go to the ED now for further evaluation.  Report was given to ED physician on duty at the [facility] ED.          Hypotension    Advised pt to remain off of losartan for now.         I have discontinued Kennith Gain. Mckeen's losartan. I am also having her maintain her ferrous sulfate, lansoprazole, hydrochlorothiazide, potassium chloride, and carvedilol.  No orders of the  defined types were placed in this encounter. 31 minutes spent on today's visit. Time was spent interviewing/examining patient and coordinating her care.

## 2023-04-10 NOTE — ED Notes (Signed)
Recliner in room for family members comfort  

## 2023-04-10 NOTE — H&P (Signed)
History and Physical  Marissa Bowman XBJ:[id number] DOB: November 12, 1965 DOA: 04/10/2023  PCP: Sandford Craze, NP   Chief Complaint: dark stool, red leg   HPI: Marissa Bowman is a 57 y.o. female with medical history significant for hypertension and chronic iron deficiency anemia being admitted to the hospital with left lower extremity cellulitis and concern for upper GI bleeding.  Patient states she went to her primary care's office today for a scheduled routine outpatient follow-up.  There, she was discovered to have redness and unilateral swelling in the left lower extremity.  She also mentioned that she has been having some melanotic stools for the last couple of days.  Due to these complaints, she was sent to the ER for evaluation.  On further discussion, patient states that she has been having some vague nausea without vomiting, no abdominal pain.  She has never had a GI bleed before, she is not on any blood thinners.  She takes iron supplementation twice daily, but noticed a couple days ago that her stool became very black and tarry.  She did not notice any blood in her stool.  She also states that her left lower extremity started to swell a little bit, and turned red a couple of days ago.  She denies any shortness of breath, fevers or chills.  ED Course: At the [facility] ER, she was noted to be afebrile, and hypotensive with blood pressure as low as 100/55.  She has been satting well on room air.  Lab work was done shows WBC 11, hemoglobin stable at 10, sodium 130, potassium 3.2, creatinine 2.12.  Rectal exam yielded positive FOBT, she had left lower extremity Doppler which ruled out DVT.  She was given a dose of IV Rocephin, and started on IV Protonix drip in the ER.  ER provider discussed with [facility], and the patient was accepted for hospitalist admission to [facility].  Review of Systems: Please see HPI for pertinent positives and negatives. A complete 10 system review of  systems are otherwise negative.  Past Medical History:  Diagnosis Date   Anemia    on iron   Asthma    childhood   Hypertension    Past Surgical History:  Procedure Laterality Date   BIOPSY  10/12/2021   Procedure: BIOPSY;  Surgeon: Napoleon Form, MD;  [facility]: [facility];  Service: Endoscopy;;   COLONOSCOPY     COLONOSCOPY WITH PROPOFOL N/A 02/03/2017   Procedure: COLONOSCOPY WITH PROPOFOL;  Surgeon: Napoleon Form, MD;  Location: [facility];  Service: Endoscopy;  Laterality: N/A;   COLONOSCOPY WITH PROPOFOL N/A 10/12/2021   Procedure: COLONOSCOPY WITH PROPOFOL;  Surgeon: Napoleon Form, MD;  Location: [facility];  Service: Endoscopy;  Laterality: N/A;   ESOPHAGOGASTRODUODENOSCOPY (EGD) WITH PROPOFOL N/A 10/12/2021   Procedure: ESOPHAGOGASTRODUODENOSCOPY (EGD) WITH PROPOFOL;  Surgeon: Napoleon Form, MD;  Location: [facility];  Service: Endoscopy;  Laterality: N/A;   NO PAST SURGERIES     POLYPECTOMY  10/12/2021   Procedure: POLYPECTOMY;  Surgeon: Napoleon Form, MD;  Location: [facility];  Service: Endoscopy;;    Social History:  reports that she has never smoked. She has never used smokeless tobacco. She reports that she does not drink alcohol and does not use drugs.   Allergies  Allergen Reactions   Latex Rash    Latex gloves with powder cause a rash    Family History  Problem Relation Age of Onset   Hypertension Mother  Hyperlipidemia Mother    Diabetes Mother    Lung cancer Father    Thyroid disease Sister    Fibromyalgia Sister    Colon cancer Neg Hx      Prior to Admission medications   Medication Sig Start Date End Date Taking? Authorizing Provider  carvedilol (COREG) 6.25 MG tablet TAKE ONE TABLET BY MOUTH TWICE A DAY WITH A MEAL 03/08/23   Sandford Craze, NP  ferrous sulfate 325 (65 FE) MG tablet Take 1 tablet (325 mg total) by mouth 2 (two) times daily with a meal. 04/28/22 12/06/22  Sandford Craze, NP   hydrochlorothiazide (HYDRODIURIL) 25 MG tablet TAKE ONE TABLET BY MOUTH ONE TIME DAILY 10/24/22   Sandford Craze, NP  lansoprazole (PREVACID) 30 MG capsule TAKE ONE CAPSULE BY MOUTH ONE TIME DAILY 10/24/22   Napoleon Form, MD  potassium chloride (KLOR-CON) 10 MEQ tablet Take 1 tablet (10 mEq total) by mouth daily. 01/05/23   Sandford Craze, NP    Physical Exam: BP (!) 123/56 (BP Location: Left Arm)   Pulse (!) 59   Temp 97.8 F (36.6 C) (Oral)   Resp (!) 22   LMP 01/27/2017   SpO2 100%   General:  Alert, oriented, calm, in no acute distress  Eyes: EOMI, clear conjuctivae, white sclerea Neck: supple, no masses, trachea mildline  Cardiovascular: RRR, no murmurs or rubs, no peripheral edema  Respiratory: clear to auscultation bilaterally, no wheezes, no crackles  Abdomen: soft, nontender, nondistended, normal bowel tones heard  Skin: dry, confluent erythema and warmth of the left lower extremity, which is significantly larger than the right lower extremity.  There is a lower left leg lateral skin tear.  No areas of fluctuance, drainage or deeper wound Musculoskeletal: no joint effusions, normal range of motion  Psychiatric: appropriate affect, normal speech  Neurologic: extraocular muscles intact, clear speech, moving all extremities with intact sensorium           Labs on Admission:  Basic Metabolic Panel: Recent Labs  Lab 04/10/23 1303 04/10/23 1304  NA 130*  --   K 3.2*  --   CL 98  --   CO2 23  --   GLUCOSE 103*  --   BUN 46*  --   CREATININE 2.12*  --   CALCIUM 7.9*  --   MG  --  2.2   Liver Function Tests: Recent Labs  Lab 04/10/23 1303  AST 39  ALT 30  ALKPHOS 65  BILITOT 0.6  PROT 7.9  ALBUMIN 3.4*   No results for input(s): "LIPASE", "AMYLASE" in the last 168 hours. No results for input(s): "AMMONIA" in the last 168 hours. CBC: Recent Labs  Lab 04/10/23 1303  WBC 11.0*  NEUTROABS 9.2*  HGB 10.1*  HCT 30.6*  MCV 80.5  PLT 245    Cardiac Enzymes: No results for input(s): "CKTOTAL", "CKMB", "CKMBINDEX", "TROPONINI" in the last 168 hours.  BNP (last 3 results) Recent Labs    04/10/23 1303  BNP 26.5    ProBNP (last 3 results) No results for input(s): "PROBNP" in the last 8760 hours.  CBG: No results for input(s): "GLUCAP" in the last 168 hours.  Radiological Exams on Admission: US Venous Img Lower Unilateral Left  Result Date: 04/10/2023 CLINICAL DATA:  Short of Breath EXAM: Left LOWER EXTREMITY VENOUS DOPPLER ULTRASOUND TECHNIQUE: Gray-scale sonography with compression, as well as color and duplex ultrasound, were performed to evaluate the deep venous system(s) from the level of the common femoral vein through  the popliteal and proximal calf veins. COMPARISON:  None Available. FINDINGS: VENOUS Normal compressibility of the common femoral, superficial femoral, and popliteal veins, as well as the visualized calf veins. Visualized portions of profunda femoral vein and great saphenous vein unremarkable. No filling defects to suggest DVT on grayscale or color Doppler imaging. Doppler waveforms show normal direction of venous flow, normal respiratory plasticity and response to augmentation. Limited views of the contralateral common femoral vein are unremarkable. OTHER None. Limitations: none IMPRESSION: Negative. Electronically Signed   By: Jasmine Pang M.D.   On: 04/10/2023 15:50   DG Chest Port 1 View  Result Date: 04/10/2023 CLINICAL DATA:  Shortness of breath. EXAM: PORTABLE CHEST 1 VIEW COMPARISON:  Radiograph dated January 28, 2020 FINDINGS: The heart size and mediastinal contours are within normal limits. Both lungs are clear. The visualized skeletal structures are unremarkable. IMPRESSION: No active disease. Electronically Signed   By: Larose Hires D.O.   On: 04/10/2023 13:28    Assessment/Plan 57 year old female with a history of hypertension, iron deficiency anemia on iron supplementation being admitted to the  hospital with left lower extremity cellulitis as well as melanotic stools concerning for upper GI bleed.  Suspected upper GI bleed-with 2 days of melanotic stools, and positive FOBT -Inpatient admission to MedSurg -Avoid blood thinners -Continue IV PPI drip -ER provider discussed with [facility], who will consult -Follow every 8 hour hemoglobin -Clear liquid diet tonight, n.p.o. after midnight  Left lower extremity cellulitis-no evidence of sepsis, normal lactate -Continue empiric IV Rocephin  Hypertension-patient is relatively hypotensive in the setting of suspected upper GI bleed.  Holding home antihypertensives for now.  Hyponatremia-this is likely hypovolemic hyponatremia, in the setting of possible upper GI bleed and relative dehydration.  It is asymptomatic.  She also takes hydrochlorothiazide at home which could be contributing. -Discontinue antihypertensives due to hypotension, and HCTZ specifically -IV normal saline infusion -Recheck sodium with BMP in the morning  AKI-likely prerenal azotemia in the setting of relative dehydration, hypotension from possible GI bleeding, and continued hydrochlorothiazide -Avoid nephrotoxins -IV hydration -Recheck renal function in the morning  DVT prophylaxis: SCDs    Code Status: Full Code  Consults called: [facility] was contacted by ER provider, I also sent a message to Dr. Chales Abrahams on secure chat after the patient's arrival to [facility].  Admission status: The appropriate patient status for this patient is INPATIENT. Inpatient status is judged to be reasonable and necessary in order to provide the required intensity of service to ensure the patient's safety. The patient's presenting symptoms, physical exam findings, and initial radiographic and laboratory data in the context of their chronic comorbidities is felt to place them at high risk for further clinical deterioration. Furthermore, it is not anticipated that the patient will be  medically stable for discharge from the hospital within 2 midnights of admission.    I certify that at the point of admission it is my clinical judgment that the patient will require inpatient hospital care spanning beyond 2 midnights from the point of admission due to high intensity of service, high risk for further deterioration and high frequency of surveillance required  Time spent: 56 minutes  Shabria Egley Sharlette Dense MD [facility] Pager [phone]  If 7PM-7AM, please contact night-coverage [email or web address] Password [facility]  04/10/2023, 6:22 PM

## 2023-04-10 NOTE — ED Triage Notes (Signed)
C/o generalized fatigue, shortness of breath and dark stools x the past few days. Sent here by [facility] for possible anemia.

## 2023-04-10 NOTE — Plan of Care (Signed)

## 2023-04-10 NOTE — ED Notes (Signed)
Called Care

## 2023-04-10 NOTE — Telephone Encounter (Signed)
Noted that patient did not present to the ED as we discussed at her visit. She declined to have CMA bring her down, stated she would walk on her own.  Called patient, she was at home and states she didn't know she needed to go to the ER. I reminded her of our long conversation advising her to go to the ER downstairs.  She states she is agreeable to go now- she is currently at home.

## 2023-04-10 NOTE — Plan of Care (Signed)

## 2023-04-10 NOTE — Progress Notes (Signed)
Phlebotomy came to draw labs multiple times and unable to obtain lab draws peripherally due to poor vasculature. Patient refusing sticks in hands for blood draws. Hospitalist made aware.

## 2023-04-10 NOTE — ED Notes (Signed)
[facility] at bedside 

## 2023-04-10 NOTE — Assessment & Plan Note (Addendum)
With hx and presentation- I expect pt is very anemic.  I have advised her to go to the ED now for further evaluation.  Report was given to ED physician on duty at the [facility] ED.

## 2023-04-10 NOTE — ED Notes (Signed)
Called [facility] for transport @3 :13pm.  Spoke with Km

## 2023-04-10 NOTE — Progress Notes (Signed)
58 year old with history of anemia of chronic disease/iron deficiency on iron supplements, asthma, HTN called her outpatient provider reporting of some shortness of breath who advised her to come to the ER.  Patient also reported of dark stools but she has been on iron.  In the ER patient was noted to have stable hemoglobin of 10.1, positive guaiac.  Blood work showed acute kidney injury with creatinine of 2.12, baseline 1.0, hypokalemia.  Chest x-ray was negative, EKG did not show acute ST-T changes.  EDP started patient on Rocephin and PPI drip.  Requesting patient to be admitted for further management.  I requested EDP to notify GI otherwise will admit patient to the hospital for further management to MedSurg.  Currently patient is hemodynamically stable.  Please notify [facility] once patient arrives to the hospital for admission orders  Stephania Fragmin MD [facility]

## 2023-04-10 NOTE — Assessment & Plan Note (Addendum)
?   Due to anemia. Defer management to ED.  EKG tracing is personally reviewed.  EKG notes NSR and changes consistent with LVH.  No acute changes.

## 2023-04-11 DIAGNOSIS — N179 Acute kidney failure, unspecified: Secondary | ICD-10-CM | POA: Diagnosis not present

## 2023-04-11 DIAGNOSIS — K921 Melena: Secondary | ICD-10-CM | POA: Diagnosis not present

## 2023-04-11 DIAGNOSIS — D638 Anemia in other chronic diseases classified elsewhere: Secondary | ICD-10-CM | POA: Diagnosis not present

## 2023-04-11 LAB — CBC
HCT: 33.3 % — ABNORMAL LOW (ref 36.0–46.0)
Hemoglobin: 10.4 g/dL — ABNORMAL LOW (ref 12.0–15.0)
MCH: 26.1 pg (ref 26.0–34.0)
MCHC: 31.2 g/dL (ref 30.0–36.0)
MCV: 83.7 fL (ref 80.0–100.0)
Platelets: 240 10*3/uL (ref 150–400)
RBC: 3.98 MIL/uL (ref 3.87–5.11)
RDW: 13.7 % (ref 11.5–15.5)
WBC: 7.4 10*3/uL (ref 4.0–10.5)
nRBC: 0 % (ref 0.0–0.2)

## 2023-04-11 LAB — HEMOGLOBIN AND HEMATOCRIT, BLOOD
HCT: 32.4 % — ABNORMAL LOW (ref 36.0–46.0)
Hemoglobin: 10.2 g/dL — ABNORMAL LOW (ref 12.0–15.0)

## 2023-04-11 LAB — BASIC METABOLIC PANEL
Anion gap: 15 (ref 5–15)
BUN: 29 mg/dL — ABNORMAL HIGH (ref 6–20)
CO2: 24 mmol/L (ref 22–32)
Calcium: 8.6 mg/dL — ABNORMAL LOW (ref 8.9–10.3)
Chloride: 102 mmol/L (ref 98–111)
Creatinine, Ser: 1.23 mg/dL — ABNORMAL HIGH (ref 0.44–1.00)
GFR, Estimated: 51 mL/min — ABNORMAL LOW (ref >60)
Glucose, Bld: 99 mg/dL (ref 70–99)
Potassium: 3.1 mmol/L — ABNORMAL LOW (ref 3.5–5.1)
Sodium: 141 mmol/L (ref 135–145)

## 2023-04-11 LAB — HIV ANTIBODY (ROUTINE TESTING W REFLEX): HIV Screen 4th Generation wRfx: NONREACTIVE

## 2023-04-11 MED ORDER — VANCOMYCIN HCL 1250 MG/250ML IV SOLN
1250.0000 mg | INTRAVENOUS | Status: DC
  Administered 2023-04-12 – 2023-04-14 (×3): 1250 mg via INTRAVENOUS
  Filled 2023-04-11 (×3): qty 250

## 2023-04-11 MED ORDER — POTASSIUM CHLORIDE CRYS ER 20 MEQ PO TBCR
30.0000 meq | EXTENDED_RELEASE_TABLET | Freq: Once | ORAL | Status: AC
  Administered 2023-04-11: 30 meq via ORAL
  Filled 2023-04-11: qty 1

## 2023-04-11 MED ORDER — PANTOPRAZOLE SODIUM 40 MG IV SOLR
40.0000 mg | Freq: Two times a day (BID) | INTRAVENOUS | Status: DC
  Administered 2023-04-11 – 2023-04-14 (×7): 40 mg via INTRAVENOUS
  Filled 2023-04-11 (×7): qty 10

## 2023-04-11 MED ORDER — VANCOMYCIN HCL 2000 MG/400ML IV SOLN
2000.0000 mg | Freq: Once | INTRAVENOUS | Status: AC
  Administered 2023-04-11: 2000 mg via INTRAVENOUS
  Filled 2023-04-11: qty 400

## 2023-04-11 MED ORDER — SODIUM CHLORIDE 0.9 % IV SOLN
2.0000 g | INTRAVENOUS | Status: DC
  Administered 2023-04-11 – 2023-04-13 (×2): 2 g via INTRAVENOUS
  Filled 2023-04-11 (×2): qty 20

## 2023-04-11 MED ORDER — POTASSIUM CHLORIDE 10 MEQ/100ML IV SOLN
10.0000 meq | INTRAVENOUS | Status: AC
  Administered 2023-04-11 (×4): 10 meq via INTRAVENOUS
  Filled 2023-04-11 (×4): qty 100

## 2023-04-11 NOTE — Progress Notes (Signed)
Patient had an episode of incontinence. Patient states that it hits her really fast and she is unable to hold it. Patient has one sock on her foot that is not swollen, but refuses to wear the other sock. I educated and informed the patient about the importance of wearing the nonslip socks when walking, but patient still refuses.  Patient requesting to sit in recliner upon return from bathroom. Patient wants bed side commode at her side to make it easier for her to use the restroom faster. Patient educated about safety precautions to take, left with call bell at bedside to call for assistance.

## 2023-04-11 NOTE — Consult Note (Addendum)
Consultation  Referring Provider:  [facility]  Primary Care Physician:  Sandford Craze, NP Primary Gastroenterologist:  Dr. Lavon Paganini       Reason for Consultation:       LOS: 1 day          HPI:   Marissa Bowman is a 57 y.o. female with past medical history significant for hypertension, chronic iron deficiency anemia, presents for evaluation of melena and heme positive stool.  Patient admitted with left lower extremity cellulitis and melena.  At [facility] ED labs noted WBC 11, hemoglobin 10, potassium 3.2.  Creatinine 2.12.  FOBT positive.  Patient was hypotensive 100/55 patient was started on IV Protonix drip and transferred to [facility].  Patient states she started having melena 3 days ago.  Prior to onset of melena she was having normal, brown and formed stools.  States she takes 2 oral iron tablets daily.  States she has had 2 dark and sticky bowel movements per day for the last 3 days.  Has not had a bowel movement since being admitted.  Denies abdominal pain, vomiting, nausea.  Reports 6 pound weight loss over the last 6 months.  Denies NSAID use.  Denies Pepto-Bismol use. denies tobacco/alcohol use.  Denies previous history of melena  Patient has chronic iron deficiency anemia for which she is supplemented with oral iron and has had recent workup for iron deficiency anemia including EGD/colonoscopy (see below).   Previous GI workup Colonoscopy 10/12/2021 for iron deficiency anemia: 5 mm hyperplastic polyp in transverse colon, diverticulosis in sigmoid colon, nonbleeding internal hemorrhoids  EGD 10/12/2021 for suspected upper GI bleed with iron deficiency anemia: Normal esophagus, gastritis, normal duodenum.  Biopsies showed active gastritis, negative for H. pylori   Past Medical History:  Diagnosis Date   Anemia    on iron   Asthma    childhood   Hypertension     Surgical History:  She  has a past surgical history that includes No past surgeries;  Colonoscopy; Colonoscopy with propofol (N/A, 02/03/2017); Esophagogastroduodenoscopy (egd) with propofol (N/A, 10/12/2021); Colonoscopy with propofol (N/A, 10/12/2021); biopsy (10/12/2021); and polypectomy (10/12/2021). Family History:  Her family history includes Diabetes in her mother; Fibromyalgia in her sister; Hyperlipidemia in her mother; Hypertension in her mother; Lung cancer in her father; Thyroid disease in her sister. Social History:   reports that she has never smoked. She has never used smokeless tobacco. She reports that she does not drink alcohol and does not use drugs.  Prior to Admission medications   Medication Sig Start Date End Date Taking? Authorizing Provider  carvedilol (COREG) 6.25 MG tablet TAKE ONE TABLET BY MOUTH TWICE A DAY WITH A MEAL 03/08/23   Sandford Craze, NP  ferrous sulfate 325 (65 FE) MG tablet Take 1 tablet (325 mg total) by mouth 2 (two) times daily with a meal. 04/28/22 12/06/22  Sandford Craze, NP  hydrochlorothiazide (HYDRODIURIL) 25 MG tablet TAKE ONE TABLET BY MOUTH ONE TIME DAILY 10/24/22   Sandford Craze, NP  lansoprazole (PREVACID) 30 MG capsule TAKE ONE CAPSULE BY MOUTH ONE TIME DAILY 10/24/22   Napoleon Form, MD  potassium chloride (KLOR-CON) 10 MEQ tablet Take 1 tablet (10 mEq total) by mouth daily. 01/05/23   Sandford Craze, NP    Current Facility-Administered Medications  Medication Dose Route Frequency Provider Last Rate Last Admin   0.9 %  sodium chloride infusion   Intravenous PRN Jacalyn Lefevre, MD   Stopped at 04/10/23  1822   acetaminophen (TYLENOL) tablet 650 mg  650 mg Oral Q6H PRN Kirby Crigler, Mir M, MD       Or   acetaminophen (TYLENOL) suppository 650 mg  650 mg Rectal Q6H PRN Kirby Crigler, Mir M, MD       albuterol (PROVENTIL) (2.5 MG/3ML) 0.083% nebulizer solution 2.5 mg  2.5 mg Nebulization Q2H PRN Kirby Crigler, Mir M, MD       cefTRIAXone (ROCEPHIN) 2 g in sodium chloride 0.9 % 100 mL IVPB  2 g Intravenous Q24H  Leatha Gilding, MD       ondansetron (ZOFRAN) tablet 4 mg  4 mg Oral Q6H PRN Kirby Crigler, Mir M, MD       Or   ondansetron (ZOFRAN) injection 4 mg  4 mg Intravenous Q6H PRN Kirby Crigler, Mir M, MD       pantoprozole (PROTONIX) 80 mg /NS 100 mL infusion  8 mg/hr Intravenous Continuous Kirby Crigler, Mir M, MD 10 mL/hr at 04/11/23 0500 8 mg/hr at 04/11/23 0500   potassium chloride 10 mEq in 100 mL IVPB  10 mEq Intravenous Q1 Hr x 4 Luiz Iron, NP 100 mL/hr at 04/11/23 0837 10 mEq at 04/11/23 0837   traZODone (DESYREL) tablet 25 mg  25 mg Oral QHS PRN Maryln Gottron, MD       [START ON 04/12/2023] vancomycin (VANCOREADY) IVPB 1250 mg/250 mL  1,250 mg Intravenous Q24H Leatha Gilding, MD       vancomycin (VANCOREADY) IVPB 2000 mg/400 mL  2,000 mg Intravenous Once Leatha Gilding, MD        Allergies as of 04/10/2023 - Review Complete 04/10/2023  Allergen Reaction Noted   Latex Rash 11/08/2016    Review of Systems  Constitutional:  Positive for weight loss. Negative for chills and fever.  HENT:  Negative for hearing loss and tinnitus.   Eyes:  Negative for blurred vision and double vision.  Respiratory:  Negative for cough and hemoptysis.   Cardiovascular:  Negative for chest pain and palpitations.  Gastrointestinal:  Positive for melena. Negative for abdominal pain, blood in stool, constipation, diarrhea, heartburn, nausea and vomiting.  Genitourinary:  Negative for dysuria and urgency.  Musculoskeletal:  Negative for myalgias and neck pain.  Skin:  Negative for itching and rash.  Neurological:  Negative for seizures and loss of consciousness.  Psychiatric/Behavioral:  Negative for depression and suicidal ideas.        Physical Exam:  Vital signs in last 24 hours: Temp:  [97.8 F (36.6 C)-98.4 F (36.9 C)] 97.8 F (36.6 C) (06/25 0454) Pulse Rate:  [56-65] 65 (06/25 0454) Resp:  [17-22] 20 (06/25 0454) BP: (99-151)/(46-118) 132/73 (06/25 0454) SpO2:  [90 %-100 %] 99 %  (06/25 0454) Weight:  [130.2 kg] 130.2 kg (06/24 1001) Last BM Date : 04/10/23 Last BM recorded by nurses in past 5 days No data recorded  Physical Exam Constitutional:      Appearance: She is obese.  HENT:     Head: Normocephalic and atraumatic.     Nose: Nose normal. No congestion.     Mouth/Throat:     Mouth: Mucous membranes are moist.     Pharynx: Oropharynx is clear.  Eyes:     General: No scleral icterus.    Extraocular Movements: Extraocular movements intact.  Cardiovascular:     Rate and Rhythm: Normal rate and regular rhythm.  Pulmonary:     Effort: Pulmonary effort is normal. No respiratory distress.  Abdominal:  General: Bowel sounds are normal. There is no distension.     Palpations: Abdomen is soft. There is no mass.     Tenderness: There is no abdominal tenderness. There is no guarding or rebound.     Hernia: No hernia is present.  Musculoskeletal:        General: No swelling. Normal range of motion.     Cervical back: Normal range of motion and neck supple.  Skin:    General: Skin is warm and dry.  Neurological:     General: No focal deficit present.     Mental Status: She is alert and oriented to person, place, and time.  Psychiatric:        Mood and Affect: Mood normal.        Behavior: Behavior normal.        Thought Content: Thought content normal.        Judgment: Judgment normal.      LAB RESULTS: Recent Labs    04/10/23 1303 04/11/23 0541  WBC 11.0* 7.4  HGB 10.1* 10.4*  HCT 30.6* 33.3*  PLT 245 240   BMET Recent Labs    04/10/23 1303 04/11/23 0541  NA 130* 141  K 3.2* 3.1*  CL 98 102  CO2 23 24  GLUCOSE 103* 99  BUN 46* 29*  CREATININE 2.12* 1.23*  CALCIUM 7.9* 8.6*   LFT Recent Labs    04/10/23 1303  PROT 7.9  ALBUMIN 3.4*  AST 39  ALT 30  ALKPHOS 65  BILITOT 0.6  BILIDIR 0.2  IBILI 0.4   PT/INR Recent Labs    04/10/23 1417  LABPROT 15.7*  INR 1.2    STUDIES: US Venous Img Lower Unilateral  Left  Result Date: 04/10/2023 CLINICAL DATA:  Short of Breath EXAM: Left LOWER EXTREMITY VENOUS DOPPLER ULTRASOUND TECHNIQUE: Gray-scale sonography with compression, as well as color and duplex ultrasound, were performed to evaluate the deep venous system(s) from the level of the common femoral vein through the popliteal and proximal calf veins. COMPARISON:  None Available. FINDINGS: VENOUS Normal compressibility of the common femoral, superficial femoral, and popliteal veins, as well as the visualized calf veins. Visualized portions of profunda femoral vein and great saphenous vein unremarkable. No filling defects to suggest DVT on grayscale or color Doppler imaging. Doppler waveforms show normal direction of venous flow, normal respiratory plasticity and response to augmentation. Limited views of the contralateral common femoral vein are unremarkable. OTHER None. Limitations: none IMPRESSION: Negative. Electronically Signed   By: Jasmine Pang M.D.   On: 04/10/2023 15:50   DG Chest Port 1 View  Result Date: 04/10/2023 CLINICAL DATA:  Shortness of breath. EXAM: PORTABLE CHEST 1 VIEW COMPARISON:  Radiograph dated January 28, 2020 FINDINGS: The heart size and mediastinal contours are within normal limits. Both lungs are clear. The visualized skeletal structures are unremarkable. IMPRESSION: No active disease. Electronically Signed   By: Larose Hires D.O.   On: 04/10/2023 13:28      Impression    Melena Heme positive stool -On 2 tablets oral iron daily -EGD/colonoscopy in 2022 unrevealing for source of anemia -Hgb 10.4 (10.2 04/2022) -MCV 83.7 -FOBT positive  AKI -BUN 29, creatinine 1.23, GFR 51, improving  Cellulitis -On vancomycin   Plan   Tentatively plan for EGD tomorrow. I thoroughly discussed the procedures to include nature, alternatives, benefits, and risks including but not limited to bleeding, perforation, infection, anesthesia/cardiac and pulmonary complications. Patient provides  understanding and gave verbal consent to proceed.  Can discontinue protonix drip and transition to Protonix IV 40mg  BID Soft diet, NPO midnight supportive care as needed.  Continue daily CBC and transfuse as needed to maintain HGB > 7   Thank you for your kind consultation, we will continue to follow.   Bayley Leanna Sato  04/11/2023, 8:38 AM   GI ATTENDING  History, laboratories, x-rays prior endoscopy reports and pathology reviewed.  Patient seen and examined.  Agree with comprehensive consultation note as outlined above.  The patient is admitted for treatment of cellulitis.  Noted to be anemic with reports of melena.  Stools are Hemoccult positive.  Hemodynamically stable.  Hemoglobin is not much different than baseline over the past few years.  She does take iron.  Agree with ongoing treatment of cellulitis and empiric PPI therapy.  Plans for upper endoscopy tomorrow to rule out any significant upper GI pathology that might explain her Hemoccult positive stool.The nature of the procedure, as well as the risks, benefits, and alternatives were carefully and thoroughly reviewed with the patient. Ample time for discussion and questions allowed. The patient understood, was satisfied, and agreed to proceed.  Wilhemina Bonito. Eda Keys., M.D. [facility] Division of Gastroenterology

## 2023-04-11 NOTE — Progress Notes (Signed)
Pharmacy Antibiotic Note  KERON KOFFMAN is a 57 y.o. female admitted on 04/10/2023 with cellulitis.  Pharmacy has been consulted for vancomycin dosing.  Pt admitted with LLE cellulitis. Pt also admitted with AKi, likely due to dehydration, hypotesnion from GI bleed and hydrochlorothiazide. Scr has improved from 2.12 to 1.23 overnight.   Plan: Vancomycin 2000 mg IV x1 then vancomycin 1250 mg IV q24h ( AUC 483, Scr 1.23 mg/dl, wt 161.0 kg)  Will adjust ceftriaxone dose to 2 grams daily  Monitor clinical course, renal function, cultures as available       Temp (24hrs), Avg:98.1 F (36.7 C), Min:97.8 F (36.6 C), Max:98.4 F (36.9 C)  Recent Labs  Lab 04/10/23 1303 04/10/23 1304 04/11/23 0541  WBC 11.0*  --  7.4  CREATININE 2.12*  --  1.23*  LATICACIDVEN  --  1.4  --     CrCl cannot be calculated (Unknown ideal weight.).    Allergies  Allergen Reactions   Latex Rash    Latex gloves with powder cause a rash    Antimicrobials this admission: 6/24 ceftriaxone >>  6/25 vancomycin  >>   Dose adjustments this admission:   Microbiology results: 6/24 BCx:     Thank you for allowing pharmacy to be a part of this patient's care.   Adalberto Cole, PharmD, BCPS 04/11/2023 7:59 AM

## 2023-04-11 NOTE — Plan of Care (Signed)

## 2023-04-11 NOTE — Progress Notes (Addendum)
PROGRESS NOTE  Marissa Bowman ZOX:[id number] DOB: 22-Feb-1966 DOA: 04/10/2023 PCP: Sandford Craze, NP   LOS: 1 day   Brief Narrative / Interim history: 57 year old female with history of HTN, chronic iron deficiency anemia comes to the hospital with 2 concerns, left lower extremity cellulitis as well as melena.  She went to see her PCP for routine scheduled follow-up the day of admission, and was have to have significant redness and swelling of the left lower extremity.  She also mentioned to the PCP that she was having melena and she was sent to the ER.  She was placed on antibiotics, GI was consulted and she was admitted to the hospital  Subjective / 24h Interval events: She tells me she wants to go home.  States that her swelling in her left leg is better but redness looks about the same.  No chest pain, no shortness of breath  Assesement and Plan: Principal problem Left lower extremity cellulitis -continue Vanco and ceftriaxone.  Still has significant cellulitic changes just from below her left knee to the left ankle.  She tells me that the swelling has come down.  -She asks to go home, but given cellulitic spread throughout almost the entire region just below her knee and above her ankle I think she needs ongoing intravenous therapy -Lower extremity ultrasound negative for DVT  Active problems Melena, suspect upper GI bleed -2 days of melanotic stools, positive fecal occult.  GI consulted, plans for EGD tomorrow  Acute kidney injury -creatinine on admission 2.1 from prior normal values.  Possibly related to a degree of hypotension on admission, GI bleed, antihypertensives.  Improved with fluids, creatinine 1.2 today -Encourage p.o. intake today  Hypertension -hypotensive on arrival, hold home antihypertensives  Hyponatremia -hypovolemic, hold HCTZ, sodium normalized with fluids  Hypokalemia-replace potassium and continue to monitor.  Magnesium normal at 2.2  Obesity,  morbid-based on BMI of 49.  She would benefit from weight loss  Scheduled Meds:  pantoprazole (PROTONIX) IV  40 mg Intravenous Q12H   Continuous Infusions:  sodium chloride Stopped (04/10/23 1822)   cefTRIAXone (ROCEPHIN)  IV     potassium chloride 10 mEq (04/11/23 0837)   [START ON 04/12/2023] vancomycin     vancomycin 2,000 mg (04/11/23 0850)   PRN Meds:.sodium chloride, acetaminophen **OR** acetaminophen, albuterol, ondansetron **OR** ondansetron (ZOFRAN) IV, traZODone  Current Outpatient Medications  Medication Instructions   carvedilol (COREG) 6.25 MG tablet TAKE ONE TABLET BY MOUTH TWICE A DAY WITH A MEAL   ferrous sulfate 325 mg, Oral, 2 times daily with meals   hydrochlorothiazide (HYDRODIURIL) 25 mg, Oral, Daily   lansoprazole (PREVACID) 30 mg, Oral, Daily   potassium chloride (KLOR-CON) 10 MEQ tablet 10 mEq, Oral, Daily    Diet Orders (From admission, onward)     Start     Ordered   04/12/23 0001  Diet NPO time specified  Diet effective midnight        04/11/23 0924   04/11/23 0925  DIET SOFT Room service appropriate? Yes; Fluid consistency: Thin  Diet effective now       Question Answer Comment  Room service appropriate? Yes   Fluid consistency: Thin      04/11/23 0924            DVT prophylaxis: SCDs Start: 04/10/23 1808   Lab Results  Component Value Date   PLT 240 04/11/2023      Code Status: Full Code  Family Communication: no family at bedside   Status  is: Inpatient  Remains inpatient appropriate because: IV antibiotics, gastroenterology evaluation  Level of care: Med-Surg  Consultants:  GI  Objective: Vitals:   04/10/23 1707 04/10/23 1757 04/10/23 1943 04/11/23 0454  BP: (!) 151/118 (!) 123/56 (!) 113/52 132/73  Pulse: 65 (!) 59 60 65  Resp: 20 (!) 22 20 20   Temp: 98 F (36.7 C) 97.8 F (36.6 C) 98.4 F (36.9 C) 97.8 F (36.6 C)  TempSrc: Oral Oral Oral Oral  SpO2: 100% 100% 100% 99%    Intake/Output Summary (Last 24 hours)  at 04/11/2023 1008 Last data filed at 04/11/2023 0800 Gross per 24 hour  Intake 2306.36 ml  Output --  Net 2306.36 ml   Wt Readings from Last 3 Encounters:  04/10/23 130.2 kg  12/09/22 135.6 kg  11/25/22 132 kg    Examination:  Constitutional: NAD Eyes: no scleral icterus ENMT: Mucous membranes are moist.  Neck: normal, supple Respiratory: clear to auscultation bilaterally, no wheezing, no crackles. Cardiovascular: Regular rate and rhythm, no murmurs / rubs / gallops.  Abdomen: non distended, no tenderness. Bowel sounds positive.  Musculoskeletal: no clubbing / cyanosis.  Skin: Extensive cellulitic injuries comprising her left lower extremity from just below the knee to above the ankle.  When I attempted to take a picture, she told me "it looks the same as the one yesterday"  Data Reviewed: I have independently reviewed following labs and imaging studies   CBC Recent Labs  Lab 04/10/23 1303 04/11/23 0541  WBC 11.0* 7.4  HGB 10.1* 10.4*  HCT 30.6* 33.3*  PLT 245 240  MCV 80.5 83.7  MCH 26.6 26.1  MCHC 33.0 31.2  RDW 14.0 13.7  LYMPHSABS 0.8  --   MONOABS 0.7  --   EOSABS 0.2  --   BASOSABS 0.0  --     Recent Labs  Lab 04/10/23 1303 04/10/23 1304 04/10/23 1417 04/11/23 0541  NA 130*  --   --  141  K 3.2*  --   --  3.1*  CL 98  --   --  102  CO2 23  --   --  24  GLUCOSE 103*  --   --  99  BUN 46*  --   --  29*  CREATININE 2.12*  --   --  1.23*  CALCIUM 7.9*  --   --  8.6*  AST 39  --   --   --   ALT 30  --   --   --   ALKPHOS 65  --   --   --   BILITOT 0.6  --   --   --   ALBUMIN 3.4*  --   --   --   MG  --  2.2  --   --   LATICACIDVEN  --  1.4  --   --   INR  --   --  1.2  --   BNP 26.5  --   --   --     ------------------------------------------------------------------------------------------------------------------ No results for input(s): "CHOL", "HDL", "LDLCALC", "TRIG", "CHOLHDL", "LDLDIRECT" in the last 72 hours.  Lab Results  Component  Value Date   HGBA1C 5.8 04/16/2021   ------------------------------------------------------------------------------------------------------------------ No results for input(s): "TSH", "T4TOTAL", "T3FREE", "THYROIDAB" in the last 72 hours.  Invalid input(s): "FREET3"  Cardiac Enzymes No results for input(s): "CKMB", "TROPONINI", "MYOGLOBIN" in the last 168 hours.  Invalid input(s): "CK" ------------------------------------------------------------------------------------------------------------------    Component Value Date/Time   BNP 26.5 04/10/2023 1303  CBG: No results for input(s): "GLUCAP" in the last 168 hours.  No results found for this or any previous visit (from the past 240 hour(s)).   Radiology Studies: US Venous Img Lower Unilateral Left  Result Date: 04/10/2023 CLINICAL DATA:  Short of Breath EXAM: Left LOWER EXTREMITY VENOUS DOPPLER ULTRASOUND TECHNIQUE: Gray-scale sonography with compression, as well as color and duplex ultrasound, were performed to evaluate the deep venous system(s) from the level of the common femoral vein through the popliteal and proximal calf veins. COMPARISON:  None Available. FINDINGS: VENOUS Normal compressibility of the common femoral, superficial femoral, and popliteal veins, as well as the visualized calf veins. Visualized portions of profunda femoral vein and great saphenous vein unremarkable. No filling defects to suggest DVT on grayscale or color Doppler imaging. Doppler waveforms show normal direction of venous flow, normal respiratory plasticity and response to augmentation. Limited views of the contralateral common femoral vein are unremarkable. OTHER None. Limitations: none IMPRESSION: Negative. Electronically Signed   By: Jasmine Pang M.D.   On: 04/10/2023 15:50   DG Chest Port 1 View  Result Date: 04/10/2023 CLINICAL DATA:  Shortness of breath. EXAM: PORTABLE CHEST 1 VIEW COMPARISON:  Radiograph dated January 28, 2020 FINDINGS: The  heart size and mediastinal contours are within normal limits. Both lungs are clear. The visualized skeletal structures are unremarkable. IMPRESSION: No active disease. Electronically Signed   By: Larose Hires D.O.   On: 04/10/2023 13:28     Pamella Pert, MD, PhD [facility]  Between 7 am - 7 pm I am available, please contact me via Amion (for emergencies) or Securechat (non urgent messages)  Between 7 pm - 7 am I am not available, please contact night coverage MD/APP via Amion

## 2023-04-11 NOTE — H&P (View-Only) (Signed)
     Consultation  Referring Provider:  [facility]  Primary Care Physician:  O'Sullivan, Melissa, NP Primary Gastroenterologist:  Dr. Nandigam       Reason for Consultation:       LOS: 1 day          HPI:   Marissa Bowman is a 57 y.o. female with past medical history significant for hypertension, chronic iron deficiency anemia, presents for evaluation of melena and heme positive stool.  Patient admitted with left lower extremity cellulitis and melena.  At [facility] ED labs noted WBC 11, hemoglobin 10, potassium 3.2.  Creatinine 2.12.  FOBT positive.  Patient was hypotensive 100/55 patient was started on IV Protonix drip and transferred to [place].  Patient states she started having melena 3 days ago.  Prior to onset of melena she was having normal, brown and formed stools.  States she takes 2 oral iron tablets daily.  States she has had 2 dark and sticky bowel movements per day for the last 3 days.  Has not had a bowel movement since being admitted.  Denies abdominal pain, vomiting, nausea.  Reports 6 pound weight loss over the last 6 months.  Denies NSAID use.  Denies Pepto-Bismol use. denies tobacco/alcohol use.  Denies previous history of melena  Patient has chronic iron deficiency anemia for which she is supplemented with oral iron and has had recent workup for iron deficiency anemia including EGD/colonoscopy (see below).   Previous GI workup Colonoscopy 10/12/2021 for iron deficiency anemia: 5 mm hyperplastic polyp in transverse colon, diverticulosis in sigmoid colon, nonbleeding internal hemorrhoids  EGD 10/12/2021 for suspected upper GI bleed with iron deficiency anemia: Normal esophagus, gastritis, normal duodenum.  Biopsies showed active gastritis, negative for H. pylori   Past Medical History:  Diagnosis Date   Anemia    on iron   Asthma    childhood   Hypertension     Surgical History:  She  has a past surgical history that includes No past surgeries;  Colonoscopy; Colonoscopy with propofol (N/A, 02/03/2017); Esophagogastroduodenoscopy (egd) with propofol (N/A, 10/12/2021); Colonoscopy with propofol (N/A, 10/12/2021); biopsy (10/12/2021); and polypectomy (10/12/2021). Family History:  Her family history includes Diabetes in her mother; Fibromyalgia in her sister; Hyperlipidemia in her mother; Hypertension in her mother; Lung cancer in her father; Thyroid disease in her sister. Social History:   reports that she has never smoked. She has never used smokeless tobacco. She reports that she does not drink alcohol and does not use drugs.  Prior to Admission medications   Medication Sig Start Date End Date Taking? Authorizing Provider  carvedilol (COREG) 6.25 MG tablet TAKE ONE TABLET BY MOUTH TWICE A DAY WITH A MEAL 03/08/23   O'Sullivan, Melissa, NP  ferrous sulfate 325 (65 FE) MG tablet Take 1 tablet (325 mg total) by mouth 2 (two) times daily with a meal. 04/28/22 12/06/22  O'Sullivan, Melissa, NP  hydrochlorothiazide (HYDRODIURIL) 25 MG tablet TAKE ONE TABLET BY MOUTH ONE TIME DAILY 10/24/22   O'Sullivan, Melissa, NP  lansoprazole (PREVACID) 30 MG capsule TAKE ONE CAPSULE BY MOUTH ONE TIME DAILY 10/24/22   Nandigam, Kavitha V, MD  potassium chloride (KLOR-CON) 10 MEQ tablet Take 1 tablet (10 mEq total) by mouth daily. 01/05/23   O'Sullivan, Melissa, NP    Current Facility-Administered Medications  Medication Dose Route Frequency Provider Last Rate Last Admin   0.9 %  sodium chloride infusion   Intravenous PRN Haviland, Julie, MD   Stopped at 04/10/23   1822   acetaminophen (TYLENOL) tablet 650 mg  650 mg Oral Q6H PRN Ikramullah, Mir M, MD       Or   acetaminophen (TYLENOL) suppository 650 mg  650 mg Rectal Q6H PRN Ikramullah, Mir M, MD       albuterol (PROVENTIL) (2.5 MG/3ML) 0.083% nebulizer solution 2.5 mg  2.5 mg Nebulization Q2H PRN Ikramullah, Mir M, MD       cefTRIAXone (ROCEPHIN) 2 g in sodium chloride 0.9 % 100 mL IVPB  2 g Intravenous Q24H  Gherghe, Costin M, MD       ondansetron (ZOFRAN) tablet 4 mg  4 mg Oral Q6H PRN Ikramullah, Mir M, MD       Or   ondansetron (ZOFRAN) injection 4 mg  4 mg Intravenous Q6H PRN Ikramullah, Mir M, MD       pantoprozole (PROTONIX) 80 mg /NS 100 mL infusion  8 mg/hr Intravenous Continuous Ikramullah, Mir M, MD 10 mL/hr at 04/11/23 0500 8 mg/hr at 04/11/23 0500   potassium chloride 10 mEq in 100 mL IVPB  10 mEq Intravenous Q1 Hr x 4 Daniels, James K, NP 100 mL/hr at 04/11/23 0837 10 mEq at 04/11/23 0837   traZODone (DESYREL) tablet 25 mg  25 mg Oral QHS PRN Ikramullah, Mir M, MD       [START ON 04/12/2023] vancomycin (VANCOREADY) IVPB 1250 mg/250 mL  1,250 mg Intravenous Q24H Gherghe, Costin M, MD       vancomycin (VANCOREADY) IVPB 2000 mg/400 mL  2,000 mg Intravenous Once Gherghe, Costin M, MD        Allergies as of 04/10/2023 - Review Complete 04/10/2023  Allergen Reaction Noted   Latex Rash 11/08/2016    Review of Systems  Constitutional:  Positive for weight loss. Negative for chills and fever.  HENT:  Negative for hearing loss and tinnitus.   Eyes:  Negative for blurred vision and double vision.  Respiratory:  Negative for cough and hemoptysis.   Cardiovascular:  Negative for chest pain and palpitations.  Gastrointestinal:  Positive for melena. Negative for abdominal pain, blood in stool, constipation, diarrhea, heartburn, nausea and vomiting.  Genitourinary:  Negative for dysuria and urgency.  Musculoskeletal:  Negative for myalgias and neck pain.  Skin:  Negative for itching and rash.  Neurological:  Negative for seizures and loss of consciousness.  Psychiatric/Behavioral:  Negative for depression and suicidal ideas.        Physical Exam:  Vital signs in last 24 hours: Temp:  [97.8 F (36.6 C)-98.4 F (36.9 C)] 97.8 F (36.6 C) (06/25 0454) Pulse Rate:  [56-65] 65 (06/25 0454) Resp:  [17-22] 20 (06/25 0454) BP: (99-151)/(46-118) 132/73 (06/25 0454) SpO2:  [90 %-100 %] 99 %  (06/25 0454) Weight:  [130.2 kg] 130.2 kg (06/24 1001) Last BM Date : 04/10/23 Last BM recorded by nurses in past 5 days No data recorded  Physical Exam Constitutional:      Appearance: She is obese.  HENT:     Head: Normocephalic and atraumatic.     Nose: Nose normal. No congestion.     Mouth/Throat:     Mouth: Mucous membranes are moist.     Pharynx: Oropharynx is clear.  Eyes:     General: No scleral icterus.    Extraocular Movements: Extraocular movements intact.  Cardiovascular:     Rate and Rhythm: Normal rate and regular rhythm.  Pulmonary:     Effort: Pulmonary effort is normal. No respiratory distress.  Abdominal:       General: Bowel sounds are normal. There is no distension.     Palpations: Abdomen is soft. There is no mass.     Tenderness: There is no abdominal tenderness. There is no guarding or rebound.     Hernia: No hernia is present.  Musculoskeletal:        General: No swelling. Normal range of motion.     Cervical back: Normal range of motion and neck supple.  Skin:    General: Skin is warm and dry.  Neurological:     General: No focal deficit present.     Mental Status: She is alert and oriented to person, place, and time.  Psychiatric:        Mood and Affect: Mood normal.        Behavior: Behavior normal.        Thought Content: Thought content normal.        Judgment: Judgment normal.      LAB RESULTS: Recent Labs    04/10/23 1303 04/11/23 0541  WBC 11.0* 7.4  HGB 10.1* 10.4*  HCT 30.6* 33.3*  PLT 245 240   BMET Recent Labs    04/10/23 1303 04/11/23 0541  NA 130* 141  K 3.2* 3.1*  CL 98 102  CO2 23 24  GLUCOSE 103* 99  BUN 46* 29*  CREATININE 2.12* 1.23*  CALCIUM 7.9* 8.6*   LFT Recent Labs    04/10/23 1303  PROT 7.9  ALBUMIN 3.4*  AST 39  ALT 30  ALKPHOS 65  BILITOT 0.6  BILIDIR 0.2  IBILI 0.4   PT/INR Recent Labs    04/10/23 1417  LABPROT 15.7*  INR 1.2    STUDIES: US Venous Img Lower Unilateral  Left  Result Date: 04/10/2023 CLINICAL DATA:  Short of Breath EXAM: Left LOWER EXTREMITY VENOUS DOPPLER ULTRASOUND TECHNIQUE: Gray-scale sonography with compression, as well as color and duplex ultrasound, were performed to evaluate the deep venous system(s) from the level of the common femoral vein through the popliteal and proximal calf veins. COMPARISON:  None Available. FINDINGS: VENOUS Normal compressibility of the common femoral, superficial femoral, and popliteal veins, as well as the visualized calf veins. Visualized portions of profunda femoral vein and great saphenous vein unremarkable. No filling defects to suggest DVT on grayscale or color Doppler imaging. Doppler waveforms show normal direction of venous flow, normal respiratory plasticity and response to augmentation. Limited views of the contralateral common femoral vein are unremarkable. OTHER None. Limitations: none IMPRESSION: Negative. Electronically Signed   By: Kim  Fujinaga M.D.   On: 04/10/2023 15:50   DG Chest Port 1 View  Result Date: 04/10/2023 CLINICAL DATA:  Shortness of breath. EXAM: PORTABLE CHEST 1 VIEW COMPARISON:  Radiograph dated January 28, 2020 FINDINGS: The heart size and mediastinal contours are within normal limits. Both lungs are clear. The visualized skeletal structures are unremarkable. IMPRESSION: No active disease. Electronically Signed   By: Imran  Ahmed D.O.   On: 04/10/2023 13:28      Impression    Melena Heme positive stool -On 2 tablets oral iron daily -EGD/colonoscopy in 2022 unrevealing for source of anemia -Hgb 10.4 (10.2 04/2022) -MCV 83.7 -FOBT positive  AKI -BUN 29, creatinine 1.23, GFR 51, improving  Cellulitis -On vancomycin   Plan   Tentatively plan for EGD tomorrow. I thoroughly discussed the procedures to include nature, alternatives, benefits, and risks including but not limited to bleeding, perforation, infection, anesthesia/cardiac and pulmonary complications. Patient provides  understanding and gave verbal consent to proceed.   Can discontinue protonix drip and transition to Protonix IV 40mg BID Soft diet, NPO midnight supportive care as needed.  Continue daily CBC and transfuse as needed to maintain HGB > 7   Thank you for your kind consultation, we will continue to follow.   Bayley M McMichael  04/11/2023, 8:38 AM   GI ATTENDING  History, laboratories, x-rays prior endoscopy reports and pathology reviewed.  Patient seen and examined.  Agree with comprehensive consultation note as outlined above.  The patient is admitted for treatment of cellulitis.  Noted to be anemic with reports of melena.  Stools are Hemoccult positive.  Hemodynamically stable.  Hemoglobin is not much different than baseline over the past few years.  She does take iron.  Agree with ongoing treatment of cellulitis and empiric PPI therapy.  Plans for upper endoscopy tomorrow to rule out any significant upper GI pathology that might explain her Hemoccult positive stool.The nature of the procedure, as well as the risks, benefits, and alternatives were carefully and thoroughly reviewed with the patient. Ample time for discussion and questions allowed. The patient understood, was satisfied, and agreed to proceed.  Jaice Digioia N. Kianni Lheureux, Jr., M.D. [facility] 

## 2023-04-11 NOTE — TOC CM/SW Note (Signed)
Transition of Care [facility]) - Inpatient Brief Assessment   Patient Details  Name: Marissa Bowman MRN: [id number] Date of Birth: [birth year]  Transition of Care [facility]) CM/SW Contact:    Durenda Guthrie, RN Phone Number: 04/11/2023, 8:46 AM   Clinical Narrative:    Transition of Care Asessment: Insurance and Status: Insurance coverage has been reviewed Patient has primary care physician: Yes Home environment has been reviewed: home with family Prior level of function:: independent Prior/Current Home Services: No current home services Social Determinants of Health Reivew: SDOH reviewed no interventions necessary Readmission risk has been reviewed: Yes Transition of care needs: no transition of care needs at this time

## 2023-04-12 ENCOUNTER — Encounter (HOSPITAL_COMMUNITY): Payer: Self-pay | Admitting: Internal Medicine

## 2023-04-12 ENCOUNTER — Inpatient Hospital Stay (HOSPITAL_COMMUNITY): Payer: BC Managed Care – PPO | Admitting: Anesthesiology

## 2023-04-12 ENCOUNTER — Encounter (HOSPITAL_COMMUNITY)
Admission: EM | Disposition: A | Discharge status: Home / Self Care | Payer: Self-pay | Source: Home / Self Care | Attending: Internal Medicine

## 2023-04-12 DIAGNOSIS — D500 Iron deficiency anemia secondary to blood loss (chronic): Secondary | ICD-10-CM | POA: Diagnosis not present

## 2023-04-12 DIAGNOSIS — K209 Esophagitis, unspecified without bleeding: Secondary | ICD-10-CM | POA: Diagnosis not present

## 2023-04-12 DIAGNOSIS — N179 Acute kidney failure, unspecified: Secondary | ICD-10-CM | POA: Diagnosis not present

## 2023-04-12 DIAGNOSIS — K253 Acute gastric ulcer without hemorrhage or perforation: Secondary | ICD-10-CM

## 2023-04-12 HISTORY — DX: Acute gastric ulcer without hemorrhage or perforation: K25.3

## 2023-04-12 HISTORY — PX: ESOPHAGOGASTRODUODENOSCOPY: SHX5428

## 2023-04-12 LAB — HEMOGLOBIN AND HEMATOCRIT, BLOOD
HCT: 34 % — ABNORMAL LOW (ref 36.0–46.0)
HCT: 33.1 % — ABNORMAL LOW (ref 36.0–46.0)
HCT: 31.4 % — ABNORMAL LOW (ref 36.0–46.0)
Hemoglobin: 11 g/dL — ABNORMAL LOW (ref 12.0–15.0)
Hemoglobin: 10.9 g/dL — ABNORMAL LOW (ref 12.0–15.0)
Hemoglobin: 9.9 g/dL — ABNORMAL LOW (ref 12.0–15.0)

## 2023-04-12 LAB — COMPREHENSIVE METABOLIC PANEL
ALT: 43 U/L (ref 0–44)
AST: 37 U/L (ref 15–41)
Albumin: 3.3 g/dL — ABNORMAL LOW (ref 3.5–5.0)
Alkaline Phosphatase: 68 U/L (ref 38–126)
Anion gap: 10 (ref 5–15)
BUN: 20 mg/dL (ref 6–20)
CO2: 26 mmol/L (ref 22–32)
Calcium: 8.8 mg/dL — ABNORMAL LOW (ref 8.9–10.3)
Chloride: 102 mmol/L (ref 98–111)
Creatinine, Ser: 1.03 mg/dL — ABNORMAL HIGH (ref 0.44–1.00)
GFR, Estimated: >60 mL/min (ref >60)
Glucose, Bld: 98 mg/dL (ref 70–99)
Potassium: 3.4 mmol/L — ABNORMAL LOW (ref 3.5–5.1)
Sodium: 138 mmol/L (ref 135–145)
Total Bilirubin: 0.7 mg/dL (ref 0.3–1.2)
Total Protein: 7.9 g/dL (ref 6.5–8.1)

## 2023-04-12 LAB — CULTURE, BLOOD (ROUTINE X 2)

## 2023-04-12 LAB — MAGNESIUM: Magnesium: 2.1 mg/dL (ref 1.7–2.4)

## 2023-04-12 SURGERY — EGD (ESOPHAGOGASTRODUODENOSCOPY)
Anesthesia: Monitor Anesthesia Care

## 2023-04-12 MED ORDER — SODIUM CHLORIDE 0.9 % IV SOLN: INTRAVENOUS | Status: DC

## 2023-04-12 MED ORDER — PROPOFOL 500 MG/50ML IV EMUL
INTRAVENOUS | Status: DC | PRN
  Administered 2023-04-12: 175 ug/kg/min via INTRAVENOUS
  Administered 2023-04-12: 50 mg via INTRAVENOUS

## 2023-04-12 MED ORDER — POTASSIUM CHLORIDE 10 MEQ/100ML IV SOLN
10.0000 meq | Freq: Once | INTRAVENOUS | Status: AC
  Administered 2023-04-12: 10 meq via INTRAVENOUS
  Filled 2023-04-12: qty 100

## 2023-04-12 MED ORDER — LACTATED RINGERS IV SOLN: INTRAVENOUS | Status: DC | PRN

## 2023-04-12 MED ORDER — DEXMEDETOMIDINE HCL IN NACL 80 MCG/20ML IV SOLN
INTRAVENOUS | Status: DC | PRN
  Administered 2023-04-12: 8 ug via INTRAVENOUS

## 2023-04-12 NOTE — Anesthesia Preprocedure Evaluation (Addendum)
Anesthesia Evaluation  Patient identified by MRN, date of birth, ID band Patient awake    Reviewed: Allergy & Precautions, NPO status , Patient's Chart, lab work & pertinent test results, reviewed documented beta blocker date and time   Airway Mallampati: II  TM Distance: >3 FB Neck ROM: Full    Dental  (+) Missing, Dental Advisory Given,    Pulmonary asthma    Pulmonary exam normal breath sounds clear to auscultation       Cardiovascular hypertension, Pt. on home beta blockers and Pt. on medications Normal cardiovascular exam Rhythm:Regular Rate:Normal     Neuro/Psych negative neurological ROS  negative psych ROS   GI/Hepatic Neg liver ROS,GERD  ,,  Endo/Other    Morbid obesity (BMI 49)  Renal/GU Renal InsufficiencyRenal diseaseLab Results      Component                Value               Date                      CREATININE               1.03 (H)            04/12/2023                BUN                      20                  04/12/2023                NA                       138                 04/12/2023                K                        3.4 (L)             04/12/2023                CL                       102                 04/12/2023                CO2                      26                  04/12/2023             negative genitourinary   Musculoskeletal negative musculoskeletal ROS (+)    Abdominal   Peds  Hematology  (+) Blood dyscrasia, anemia Lab Results      Component                Value               Date                      WBC  7.4                 04/11/2023                HGB                      10.9 (L)            04/12/2023                HCT                      33.1 (L)            04/12/2023                MCV                      83.7                04/11/2023                PLT                      240                 04/11/2023              Anesthesia Other  Findings   Reproductive/Obstetrics                             Anesthesia Physical Anesthesia Plan  ASA: 3  Anesthesia Plan: MAC   Post-op Pain Management:    Induction: Intravenous  PONV Risk Score and Plan: Propofol infusion and Treatment may vary due to age or medical condition  Airway Management Planned: Natural Airway  Additional Equipment:   Intra-op Plan:   Post-operative Plan:   Informed Consent: I have reviewed the patients History and Physical, chart, labs and discussed the procedure including the risks, benefits and alternatives for the proposed anesthesia with the patient or authorized representative who has indicated his/her understanding and acceptance.     Dental advisory given  Plan Discussed with: CRNA  Anesthesia Plan Comments:        Anesthesia Quick Evaluation

## 2023-04-12 NOTE — Interval H&P Note (Signed)
History and Physical Interval Note:  04/12/2023 1:49 PM  Marissa Bowman  has presented today for surgery, with the diagnosis of melena, anemia.  The various methods of treatment have been discussed with the patient and family. After consideration of risks, benefits and other options for treatment, the patient has consented to  Procedure(s): ESOPHAGOGASTRODUODENOSCOPY (EGD) (N/A) as a surgical intervention.  The patient's history has been reviewed, patient examined, no change in status, stable for surgery.  I have reviewed the patient's chart and labs.  Questions were answered to the patient's satisfaction.     [name]

## 2023-04-12 NOTE — Op Note (Signed)
[facility] Patient Name: Marissa Bowman Procedure Date: 04/12/2023 MRN: [id number] Attending MD: Wilhemina Bonito. Marina Goodell , MD, [id number] Date of Birth: April 28, 1966 CSN: [id number] Age: 57 Admit Type: Inpatient Procedure:                Upper GI endoscopy Indications:              Chronic anemia, Heme positive stool Providers:                Wilhemina Bonito. Marina Goodell, MD, Eliberto Ivory, RN, Adolph Pollack, RN Referring MD:             [facility] Medicines:                Monitored Anesthesia Care Complications:            No immediate complications. Estimated Blood Loss:     Estimated blood loss: none. Procedure:                Pre-Anesthesia Assessment:                           - Prior to the procedure, a History and Physical                            was performed, and patient medications and                            allergies were reviewed. The patient's tolerance of                            previous anesthesia was also reviewed. The risks                            and benefits of the procedure and the sedation                            options and risks were discussed with the patient.                            All questions were answered, and informed consent                            was obtained. Prior Anticoagulants: The patient has                            taken no anticoagulant or antiplatelet agents. ASA                            Grade Assessment: II - A patient with mild systemic                            disease. After reviewing the risks and benefits,  the patient was deemed in satisfactory condition to                            undergo the procedure.                           After obtaining informed consent, the endoscope was                            passed under direct vision. Throughout the                            procedure, the patient's blood pressure, pulse, and                             oxygen saturations were monitored continuously. The                            [id number]-[id number] ([id number] Olympus endoscope was introduced                            through the mouth, and advanced to the second part                            of duodenum. The upper GI endoscopy was                            accomplished without difficulty. The patient                            tolerated the procedure well. Scope In: Scope Out: Findings:      The esophagus was normal.      The stomach revealed several linear erosions.      The examined duodenum was normal.      The cardia and gastric fundus were normal on retroflexion. Impression:               1. Erosive gastritis                           2. Otherwise normal EGD.                           3. Chronic anemia at baseline Moderate Sedation:      none Recommendation:           - Patient has a contact number available for                            emergencies. The signs and symptoms of potential                            delayed complications were discussed with the                            patient. Return to normal activities tomorrow.  Written discharge instructions were provided to the                            patient.                           - Resume previous diet.                           - Continue present medications.                           - Recommend pantoprazole 40 mg daily indefinitely                           ?" Avoid NSAIDs                           ?" Return to the care of primary service                           ?"GI will sign off Procedure Code(s):        --- Professional ---                           [phone], Esophagogastroduodenoscopy, flexible,                            transoral; diagnostic, including collection of                            specimen(s) by brushing or washing, when performed                            (separate procedure) Diagnosis Code(s):        --- Professional  ---                           D50.0, Iron deficiency anemia secondary to blood                            loss (chronic)                           R19.5, Other fecal abnormalities CPT copyright 2022 [employer]. All rights reserved. The codes documented in this report are preliminary and upon coder review may  be revised to meet current compliance requirements. Wilhemina Bonito. Marina Goodell, MD 04/12/2023 3:11:39 PM This report has been signed electronically. Number of Addenda: 0

## 2023-04-12 NOTE — Plan of Care (Signed)
  Problem: Activity: Goal: Risk for activity intolerance will decrease Outcome: Progressing   Problem: Clinical Measurements: Goal: Will remain free from infection Outcome: Progressing   Problem: Pain Managment: Goal: General experience of comfort will improve Outcome: Progressing

## 2023-04-12 NOTE — Progress Notes (Signed)
PROGRESS NOTE    Marissa Bowman  ZOX:[id number] DOB: 1966/08/06 DOA: 04/10/2023 PCP: Sandford Craze, NP   Brief Narrative: 57 year old with past medical history significant for hypertension, chronic iron deficiency anemia presents to the hospital with left lower extremity cellulitis and also melena.  Patient went to her PCP the day of admission for further evaluation, she mentioned that she had melena and she was referred to the ED for further evaluation.  Patient has been admitted for left lower extremity cellulitis and melena.  GI consulted plan for endoscopy 6/26    Assessment & Plan:   Principal Problem:   AKI (acute kidney injury) (HCC)  1-Left Lower extremity cellulitis: Patient presented with redness, blister, pain. Redness and edema persist, patient will benefit from another day of IV antibiotics.  Continue IV vancomycin and ceftriaxone Doppler:  Negative for DVT Keep leg elevated  2-Melena:concern for upper GI bleed: Fecal occult blood positive.  GI planning endoscopy today  3-Acute kidney injury: Creatinine on admission 2.1. Suspect in the setting of hypovolemia, hypotension GI bleed Improved with fluids, creatinine down to 1.2  4-Hypotension;  She was hypotensive on arrival, continue to hold blood pressure medication Blood pressure 120s to 100s still  Resume carvedilol when blood pressure allows  Hyponatremia: Hypovolemic, holding hydrochlorothiazide treated with IV fluids Hypokalemia: Replaced  Morbid Obesity: BMI 49: She will benefit from weight loss     Estimated body mass index is 49.26 kg/m as calculated from the following:   Height as of 04/26/22: 5\' 4"  (1.626 m).   Weight as of an earlier encounter on 04/10/23: 130.2 kg.   DVT prophylaxis: SCD Code Status: Full code Family Communication: Crae discussed with patient.  Disposition Plan:  Status is: Inpatient Remains inpatient appropriate because: management of cellulitis     Consultants:   GI  Procedures:  Endoscopy   Antimicrobials:    Subjective: She report some improvement of redness lower extremities.  LE appears red, edematous, blister.    Objective: Vitals:   04/11/23 1202 04/11/23 1202 04/11/23 2208 04/12/23 0456  BP: 113/64 113/64 (!) 121/56 (!) 123/42  Pulse: 67 65 (!) 57 (!) 56  Resp: 16 16 20 20   Temp: 98.2 F (36.8 C) 98.2 F (36.8 C) 98.2 F (36.8 C) 97.9 F (36.6 C)  TempSrc: Oral Oral Oral Oral  SpO2: 98% 98% 100% 100%    Intake/Output Summary (Last 24 hours) at 04/12/2023 0827 Last data filed at 04/12/2023 0600 Gross per 24 hour  Intake 853.2 ml  Output --  Net 853.2 ml   There were no vitals filed for this visit.  Examination:  General exam: Appears calm and comfortable  Respiratory system: Clear to auscultation. Respiratory effort normal. Cardiovascular system: S1 & S2 heard, RRR. No JVD, murmurs, rubs, gallops or clicks. No pedal edema. Gastrointestinal system: Abdomen is nondistended, soft and nontender. No organomegaly or masses felt. Normal bowel sounds heard. Central nervous system: Alert and oriented. No focal neurological deficits. Extremities: Left LE with redness, blister, edema.    Data Reviewed: I have personally reviewed following labs and imaging studies  CBC: Recent Labs  Lab 04/10/23 1303 04/11/23 0541 04/11/23 2051 04/12/23 0609  WBC 11.0* 7.4  --   --   NEUTROABS 9.2*  --   --   --   HGB 10.1* 10.4* 10.2* 11.0*  HCT 30.6* 33.3* 32.4* 34.0*  MCV 80.5 83.7  --   --   PLT 245 240  --   --    Basic  Metabolic Panel: Recent Labs  Lab 04/10/23 1303 04/10/23 1304 04/11/23 0541 04/12/23 0613  NA 130*  --  141 138  K 3.2*  --  3.1* 3.4*  CL 98  --  102 102  CO2 23  --  24 26  GLUCOSE 103*  --  99 98  BUN 46*  --  29* 20  CREATININE 2.12*  --  1.23* 1.03*  CALCIUM 7.9*  --  8.6* 8.8*  MG  --  2.2  --  2.1   GFR: CrCl cannot be calculated (Unknown ideal weight.). Liver Function Tests: Recent Labs   Lab 04/10/23 1303 04/12/23 0613  AST 39 37  ALT 30 43  ALKPHOS 65 68  BILITOT 0.6 0.7  PROT 7.9 7.9  ALBUMIN 3.4* 3.3*   No results for input(s): "LIPASE", "AMYLASE" in the last 168 hours. No results for input(s): "AMMONIA" in the last 168 hours. Coagulation Profile: Recent Labs  Lab 04/10/23 1417  INR 1.2   Cardiac Enzymes: No results for input(s): "CKTOTAL", "CKMB", "CKMBINDEX", "TROPONINI" in the last 168 hours. BNP (last 3 results) No results for input(s): "PROBNP" in the last 8760 hours. HbA1C: No results for input(s): "HGBA1C" in the last 72 hours. CBG: No results for input(s): "GLUCAP" in the last 168 hours. Lipid Profile: No results for input(s): "CHOL", "HDL", "LDLCALC", "TRIG", "CHOLHDL", "LDLDIRECT" in the last 72 hours. Thyroid Function Tests: No results for input(s): "TSH", "T4TOTAL", "FREET4", "T3FREE", "THYROIDAB" in the last 72 hours. Anemia Panel: No results for input(s): "VITAMINB12", "FOLATE", "FERRITIN", "TIBC", "IRON", "RETICCTPCT" in the last 72 hours. Sepsis Labs: Recent Labs  Lab 04/10/23 1304  LATICACIDVEN 1.4    No results found for this or any previous visit (from the past 240 hour(s)).       Radiology Studies: US Venous Img Lower Unilateral Left  Result Date: 04/10/2023 CLINICAL DATA:  Short of Breath EXAM: Left LOWER EXTREMITY VENOUS DOPPLER ULTRASOUND TECHNIQUE: Gray-scale sonography with compression, as well as color and duplex ultrasound, were performed to evaluate the deep venous system(s) from the level of the common femoral vein through the popliteal and proximal calf veins. COMPARISON:  None Available. FINDINGS: VENOUS Normal compressibility of the common femoral, superficial femoral, and popliteal veins, as well as the visualized calf veins. Visualized portions of profunda femoral vein and great saphenous vein unremarkable. No filling defects to suggest DVT on grayscale or color Doppler imaging. Doppler waveforms show normal  direction of venous flow, normal respiratory plasticity and response to augmentation. Limited views of the contralateral common femoral vein are unremarkable. OTHER None. Limitations: none IMPRESSION: Negative. Electronically Signed   By: Jasmine Pang M.D.   On: 04/10/2023 15:50   DG Chest Port 1 View  Result Date: 04/10/2023 CLINICAL DATA:  Shortness of breath. EXAM: PORTABLE CHEST 1 VIEW COMPARISON:  Radiograph dated January 28, 2020 FINDINGS: The heart size and mediastinal contours are within normal limits. Both lungs are clear. The visualized skeletal structures are unremarkable. IMPRESSION: No active disease. Electronically Signed   By: Larose Hires D.O.   On: 04/10/2023 13:28        Scheduled Meds:  pantoprazole (PROTONIX) IV  40 mg Intravenous Q12H   Continuous Infusions:  sodium chloride Stopped (04/10/23 1822)   cefTRIAXone (ROCEPHIN)  IV 2 g (04/11/23 1300)   potassium chloride 10 mEq (04/12/23 0821)   vancomycin       LOS: 2 days    Time spent: 35 minutes     Wania Longstreth Bonita Quin, MD  [facility]   If 7PM-7AM, please contact night-coverage [email or web address]  04/12/2023, 8:27 AM

## 2023-04-12 NOTE — Transfer of Care (Signed)
Immediate Anesthesia Transfer of Care Note  Patient: Marissa Bowman  Procedure(s) Performed: Procedure(s): ESOPHAGOGASTRODUODENOSCOPY (EGD) (N/A)  Patient Location: PACU and Endoscopy Unit  Anesthesia Type:MAC  Level of Consciousness: awake, alert  and oriented  Airway & Oxygen Therapy: Patient Spontanous Breathing and Patient connected to nasal cannula oxygen  Post-op Assessment: Report given to RN and Post -op Vital signs reviewed and stable  Post vital signs: Reviewed and stable  Last Vitals:  Vitals:   04/12/23 1334 04/12/23 1512  BP: (!) 168/73 (!) 132/47  Pulse: 60 69  Resp: 14   Temp: (!) 36.3 C   SpO2: 100% 98%    Complications: No apparent anesthesia complications

## 2023-04-13 DIAGNOSIS — N179 Acute kidney failure, unspecified: Secondary | ICD-10-CM | POA: Diagnosis not present

## 2023-04-13 LAB — BASIC METABOLIC PANEL
Anion gap: 13 (ref 5–15)
BUN: 13 mg/dL (ref 6–20)
CO2: 23 mmol/L (ref 22–32)
Calcium: 8.4 mg/dL — ABNORMAL LOW (ref 8.9–10.3)
Chloride: 104 mmol/L (ref 98–111)
Creatinine, Ser: 0.86 mg/dL (ref 0.44–1.00)
GFR, Estimated: >60 mL/min (ref >60)
Glucose, Bld: 108 mg/dL — ABNORMAL HIGH (ref 70–99)
Potassium: 3.6 mmol/L (ref 3.5–5.1)
Sodium: 140 mmol/L (ref 135–145)

## 2023-04-13 LAB — CULTURE, BLOOD (ROUTINE X 2): Culture: NO GROWTH

## 2023-04-13 LAB — HEMOGLOBIN AND HEMATOCRIT, BLOOD
HCT: 32.2 % — ABNORMAL LOW (ref 36.0–46.0)
Hemoglobin: 10.2 g/dL — ABNORMAL LOW (ref 12.0–15.0)

## 2023-04-13 MED ORDER — CARVEDILOL 6.25 MG PO TABS
6.2500 mg | ORAL_TABLET | Freq: Two times a day (BID) | ORAL | Status: DC
  Administered 2023-04-13 – 2023-04-14 (×2): 6.25 mg via ORAL
  Filled 2023-04-13 (×2): qty 1

## 2023-04-13 MED ORDER — DIPHENHYDRAMINE HCL 25 MG PO CAPS
25.0000 mg | ORAL_CAPSULE | Freq: Three times a day (TID) | ORAL | Status: DC | PRN
  Administered 2023-04-13: 25 mg via ORAL
  Filled 2023-04-13: qty 1

## 2023-04-13 NOTE — Consult Note (Signed)
WOC Nurse Consult Note: Reason for Consult:left LE with pretibial skin loss (partial thickness), also surrounding erythema, edema Wound type: Partial thickness, venous insufficiency Pressure Injury POA: N/A Measurement:1cm x 1.5cm x 0.1cm Wound bed:red,.moist Drainage (amount, consistency, odor) Minimal, serous Periwound:erythematous, edematous Dressing procedure/placement/frequency:I have provided Nursing with guidance in the care of this wound using a daily cleanse to reduce bacteria and following with topical care consisting of covering the affected area with an antimicrobial nonadherent (xeroform) gauze topped with an ABD pad for comfort and securing with a few turns of Kerlix roll gauze/paper tape. No tape is to be placed on the skin. Heels are to be floated and a prophylactic foam placed to the sacrum for PI prevention.  WOC nursing team will not follow, but will remain available to this patient, the nursing and medical teams.  Please re-consult if needed.  Thank you for inviting [place] to participate in this patient's Plan of Care.  Ladona Mow, MSN, RN, CNS, GNP, Leda Min, [employer], [employer] phone:  [phone]

## 2023-04-13 NOTE — Progress Notes (Signed)
PROGRESS NOTE    Marissa Bowman  HYW:[id number] DOB: [birth year] DOA: 04/10/2023 PCP: Sandford Craze, NP   Brief Narrative: 57 year old with past medical history significant for hypertension, chronic iron deficiency anemia presents to the hospital with left lower extremity cellulitis and also melena.  Patient went to her PCP the day of admission for further evaluation, she mentioned that she had melena and she was referred to the ED for further evaluation.  Patient has been admitted for left lower extremity cellulitis and melena.  GI consulted plan for endoscopy 6/26    Assessment & Plan:   Principal Problem:   AKI (acute kidney injury) (HCC) Active Problems:   Acute gastric erosion  1-Left Lower extremity cellulitis: Patient presented with redness, blister, pain. Small open wound.  Continue IV vancomycin and ceftriaxone Doppler:  Negative for DVT Keep leg elevated Continue with IV antibiotics.   2-Melena:concern for upper GI bleed: Fecal occult blood positive.  Underwent endoscopy 6/26 showed Erosive Gastritis.  Needs PPI daily indefinitely.    3-Acute kidney injury: Creatinine on admission 2.1. Suspect in the setting of hypovolemia, hypotension GI bleed Improved with fluids, creatinine down to 1.2  4-Hypotension;  She was hypotensive on arrival, continue to hold blood pressure medication Blood pressure 120s to 100s still  Resume carvedilol today, BP increasing.   Hyponatremia: Hypovolemic, holding hydrochlorothiazide treated with IV fluids Hypokalemia: Replaced  Morbid Obesity: BMI 49: She will benefit from weight loss     Estimated body mass index is 49.26 kg/m as calculated from the following:   Height as of this encounter: 5\' 4"  (1.626 m).   Weight as of this encounter: 130.2 kg.   DVT prophylaxis: SCD Code Status: Full code Family Communication: Crae discussed with patient.  Disposition Plan:  Status is: Inpatient Remains inpatient appropriate  because: management of cellulitis     Consultants:  GI  Procedures:  Endoscopy   Antimicrobials:    Subjective: Redness LE persist , report pain has improved.    Objective: Vitals:   04/12/23 1615 04/12/23 1800 04/12/23 1929 04/13/23 0555  BP: (!) 142/67  (!) 141/45 (!) 143/68  Pulse: (!) 58  74 65  Resp: 18  20 17   Temp: (!) 97.5 F (36.4 C)  98.2 F (36.8 C) 98.9 F (37.2 C)  TempSrc: Oral  Oral Oral  SpO2: 100%  100% 98%  Weight:  130.2 kg    Height:  5\' 4"  (1.626 m)      Intake/Output Summary (Last 24 hours) at 04/13/2023 1350 Last data filed at 04/13/2023 1113 Gross per 24 hour  Intake 1943.05 ml  Output --  Net 1943.05 ml    Filed Weights   04/12/23 1800  Weight: 130.2 kg    Examination:  General exam: NAD Respiratory system: CTA Cardiovascular system: S 1, S 2 RRR Gastrointestinal system: BS present, soft, nt Central nervous system non focal.  Extremities: Left LE with erythema, redness, blister and small open wound   Data Reviewed: I have personally reviewed following labs and imaging studies  CBC: Recent Labs  Lab 04/10/23 1303 04/11/23 0541 04/11/23 2051 04/12/23 0609 04/12/23 1116 04/12/23 2055 04/13/23 0547  WBC 11.0* 7.4  --   --   --   --   --   NEUTROABS 9.2*  --   --   --   --   --   --   HGB 10.1* 10.4* 10.2* 11.0* 10.9* 9.9* 10.2*  HCT 30.6* 33.3* 32.4* 34.0* 33.1* 31.4* 32.2*  MCV  80.5 83.7  --   --   --   --   --   PLT 245 240  --   --   --   --   --     Basic Metabolic Panel: Recent Labs  Lab 04/10/23 1303 04/10/23 1304 04/11/23 0541 04/12/23 0613 04/13/23 0547  NA 130*  --  141 138 140  K 3.2*  --  3.1* 3.4* 3.6  CL 98  --  102 102 104  CO2 23  --  24 26 23   GLUCOSE 103*  --  99 98 108*  BUN 46*  --  29* 20 13  CREATININE 2.12*  --  1.23* 1.03* 0.86  CALCIUM 7.9*  --  8.6* 8.8* 8.4*  MG  --  2.2  --  2.1  --     GFR: Estimated Creatinine Clearance: 94.4 mL/min (by C-G formula based on SCr of 0.86  mg/dL). Liver Function Tests: Recent Labs  Lab 04/10/23 1303 04/12/23 0613  AST 39 37  ALT 30 43  ALKPHOS 65 68  BILITOT 0.6 0.7  PROT 7.9 7.9  ALBUMIN 3.4* 3.3*    No results for input(s): "LIPASE", "AMYLASE" in the last 168 hours. No results for input(s): "AMMONIA" in the last 168 hours. Coagulation Profile: Recent Labs  Lab 04/10/23 1417  INR 1.2    Cardiac Enzymes: No results for input(s): "CKTOTAL", "CKMB", "CKMBINDEX", "TROPONINI" in the last 168 hours. BNP (last 3 results) No results for input(s): "PROBNP" in the last 8760 hours. HbA1C: No results for input(s): "HGBA1C" in the last 72 hours. CBG: No results for input(s): "GLUCAP" in the last 168 hours. Lipid Profile: No results for input(s): "CHOL", "HDL", "LDLCALC", "TRIG", "CHOLHDL", "LDLDIRECT" in the last 72 hours. Thyroid Function Tests: No results for input(s): "TSH", "T4TOTAL", "FREET4", "T3FREE", "THYROIDAB" in the last 72 hours. Anemia Panel: No results for input(s): "VITAMINB12", "FOLATE", "FERRITIN", "TIBC", "IRON", "RETICCTPCT" in the last 72 hours. Sepsis Labs: Recent Labs  Lab 04/10/23 1304  LATICACIDVEN 1.4     Recent Results (from the past 240 hour(s))  Culture, blood (routine x 2)     Status: None (Preliminary result)   Collection Time: 04/10/23  1:04 PM   Specimen: BLOOD  Result Value Ref Range Status   Specimen Description   Final    BLOOD RIGHT ANTECUBITAL Performed at [facility], [address] [place], [place] [address]    Special Requests   Final    BOTTLES DRAWN AEROBIC AND ANAEROBIC Blood Culture adequate volume Performed at [facility], [address] [place], [place] [address]    Culture   Final    NO GROWTH 3 DAYS Performed at [facility], [address] [place], [place] [address]    Report Status PENDING  Incomplete  Culture, blood (routine x 2)     Status: None (Preliminary result)   Collection Time: 04/10/23  1:09 PM   Specimen:  BLOOD  Result Value Ref Range Status   Specimen Description   Final    BLOOD RIGHT ANTECUBITAL Performed at [facility], [address] [place], [place] [address]    Special Requests   Final    BOTTLES DRAWN AEROBIC AND ANAEROBIC Blood Culture adequate volume Performed at [facility], [address] [place], [place] [address]    Culture   Final    NO GROWTH 3 DAYS Performed at [facility], [address] [place], [place] [address]    Report Status PENDING  Incomplete         Radiology Studies: No results found.      Scheduled Meds:  pantoprazole (PROTONIX) IV  40 mg Intravenous Q12H   Continuous Infusions:  cefTRIAXone (ROCEPHIN)  IV 2 g (04/13/23 1327)   vancomycin 1,250 mg (04/13/23 1001)     LOS: 3 days    Time spent: 35 minutes     Emelin Dascenzo A Viviana Trimble, MD [facility]   If 7PM-7AM, please contact night-coverage [email or web address]  04/13/2023, 1:50 PM

## 2023-04-13 NOTE — Plan of Care (Signed)

## 2023-04-13 NOTE — Anesthesia Postprocedure Evaluation (Signed)
Anesthesia Post Note  Patient: Marissa Bowman  Procedure(s) Performed: ESOPHAGOGASTRODUODENOSCOPY (EGD)     Patient location during evaluation: PACU Anesthesia Type: MAC Level of consciousness: awake and alert Pain management: pain level controlled Vital Signs Assessment: post-procedure vital signs reviewed and stable Respiratory status: spontaneous breathing, nonlabored ventilation, respiratory function stable and patient connected to nasal cannula oxygen Cardiovascular status: stable and blood pressure returned to baseline Postop Assessment: no apparent nausea or vomiting Anesthetic complications: no  No notable events documented.  Last Vitals:  Vitals:   04/13/23 0555 04/13/23 1359  BP: (!) 143/68 (!) 141/59  Pulse: 65 73  Resp: 17 18  Temp: 37.2 C 36.6 C  SpO2: 98% 100%    Last Pain:  Vitals:   04/13/23 1359  TempSrc: Oral  PainSc:    Pain Goal:                   Lavren Lewan L Chaquetta Schlottman

## 2023-04-14 ENCOUNTER — Encounter (HOSPITAL_COMMUNITY): Payer: Self-pay | Admitting: Internal Medicine

## 2023-04-14 DIAGNOSIS — N179 Acute kidney failure, unspecified: Secondary | ICD-10-CM | POA: Diagnosis not present

## 2023-04-14 LAB — CULTURE, BLOOD (ROUTINE X 2)

## 2023-04-14 MED ORDER — SULFAMETHOXAZOLE-TRIMETHOPRIM 800-160 MG PO TABS: 1.0000 | ORAL_TABLET | Freq: Two times a day (BID) | ORAL | 0 refills | Status: DC

## 2023-04-14 MED ORDER — AMOXICILLIN-POT CLAVULANATE 875-125 MG PO TABS: 1.0000 | ORAL_TABLET | Freq: Two times a day (BID) | ORAL | 0 refills | Status: DC

## 2023-04-14 MED ORDER — PANTOPRAZOLE SODIUM 40 MG PO TBEC: 40.0000 mg | DELAYED_RELEASE_TABLET | Freq: Every day | ORAL | 1 refills | Status: DC

## 2023-04-14 NOTE — Plan of Care (Signed)

## 2023-04-14 NOTE — Discharge Summary (Signed)
Physician Discharge Summary   Patient: Marissa Bowman MRN: [id number] DOB: 15-Dec-1965  Admit date:     04/10/2023  Discharge date: 04/14/23  Discharge Physician: Alba Cory   PCP: Sandford Craze, NP   Recommendations at discharge:    Follow up on resolution of LE cellulitis.  Needs CBC to follow Hb.   Discharge Diagnoses: Principal Problem:   AKI (acute kidney injury) (HCC) Active Problems:   Acute gastric erosion  Resolved Problems:   * No resolved hospital problems. *  Hospital Course: 57 year old with past medical history significant for hypertension, chronic iron deficiency anemia presents to the hospital with left lower extremity cellulitis and also melena.  Patient went to her PCP the day of admission for further evaluation, she mentioned that she had melena and she was referred to the ED for further evaluation.   Patient has been admitted for left lower extremity cellulitis and melena.  GI consulted plan for endoscopy 6/26  Assessment and Plan: 1-Left Lower extremity cellulitis: Patient presented with redness, blister, pain. Small open wound.  Doppler:  Negative for DVT Keep leg elevated Treated with IV antibiotics : Vancomycin and Ceftriaxone.  Redness improved. Patient wishes to go home, not willing to stay for more IV antibiotics.  Evaluated by wound care, local care ordered.  She will be discharge on Augmentin and Bactrim for 7 days.   2-Melena: secondary to Erosive Gastritis.  concern for upper GI bleed: Fecal occult blood positive.  Underwent endoscopy 6/26 showed Erosive Gastritis.  Needs PPI daily indefinitely.   Hb stable.   3-Acute kidney injury: Creatinine on admission 2.1. Suspect in the setting of hypovolemia, hypotension GI bleed Improved with fluids, creatinine down to 1.2   4-Hypotension;  She was hypotensive on arrival, continue to hold blood pressure medication Blood pressure 120s to 100s still  Resume carvedilol today, BP  increasing.    Hyponatremia: Hypovolemic, holding hydrochlorothiazide treated with IV fluids Hypokalemia: Replaced   Morbid Obesity: BMI 49: She will benefit from weight loss          Consultants: GI Procedures performed: Endoscopy: erosive gastritis.  Disposition: Home Diet recommendation:  Discharge Diet Orders (From admission, onward)     Start     Ordered   04/14/23 0000  Diet - low sodium heart healthy        04/14/23 1340           Carb modified diet DISCHARGE MEDICATION: Allergies as of 04/14/2023       Reactions   Latex Rash   Latex gloves with powder cause a rash        Medication List     STOP taking these medications    hydrochlorothiazide 25 MG tablet Commonly known as: HYDRODIURIL   lansoprazole 30 MG capsule Commonly known as: PREVACID   potassium chloride 10 MEQ tablet Commonly known as: KLOR-CON       TAKE these medications    amoxicillin-clavulanate 875-125 MG tablet Commonly known as: AUGMENTIN Take 1 tablet by mouth 2 (two) times daily for 7 days.   carvedilol 6.25 MG tablet Commonly known as: COREG TAKE ONE TABLET BY MOUTH TWICE A DAY WITH A MEAL What changed: See the new instructions.   ferrous sulfate 325 (65 FE) MG tablet Take 1 tablet (325 mg total) by mouth 2 (two) times daily with a meal.   pantoprazole 40 MG tablet Commonly known as: Protonix Take 1 tablet (40 mg total) by mouth daily.   sulfamethoxazole-trimethoprim 800-160 MG tablet  Commonly known as: BACTRIM DS Take 1 tablet by mouth 2 (two) times daily for 7 days.        Follow-up Information     Sandford Craze, NP Follow up in 1 week(s).   Specialty: Internal Medicine Contact information: [address] [place] [address] [phone]                Discharge Exam: [facility]   04/12/23 1800  Weight: 130.2 kg   General; NAD  Condition at discharge: stable  The results of significant diagnostics from this  hospitalization (including imaging, microbiology, ancillary and laboratory) are listed below for reference.   Imaging Studies: US Venous Img Lower Unilateral Left  Result Date: 04/10/2023 CLINICAL DATA:  Short of Breath EXAM: Left LOWER EXTREMITY VENOUS DOPPLER ULTRASOUND TECHNIQUE: Gray-scale sonography with compression, as well as color and duplex ultrasound, were performed to evaluate the deep venous system(s) from the level of the common femoral vein through the popliteal and proximal calf veins. COMPARISON:  None Available. FINDINGS: VENOUS Normal compressibility of the common femoral, superficial femoral, and popliteal veins, as well as the visualized calf veins. Visualized portions of profunda femoral vein and great saphenous vein unremarkable. No filling defects to suggest DVT on grayscale or color Doppler imaging. Doppler waveforms show normal direction of venous flow, normal respiratory plasticity and response to augmentation. Limited views of the contralateral common femoral vein are unremarkable. OTHER None. Limitations: none IMPRESSION: Negative. Electronically Signed   By: Jasmine Pang M.D.   On: 04/10/2023 15:50   DG Chest Port 1 View  Result Date: 04/10/2023 CLINICAL DATA:  Shortness of breath. EXAM: PORTABLE CHEST 1 VIEW COMPARISON:  Radiograph dated January 28, 2020 FINDINGS: The heart size and mediastinal contours are within normal limits. Both lungs are clear. The visualized skeletal structures are unremarkable. IMPRESSION: No active disease. Electronically Signed   By: Larose Hires D.O.   On: 04/10/2023 13:28    Microbiology: Results for orders placed or performed during the hospital encounter of 04/10/23  Culture, blood (routine x 2)     Status: None (Preliminary result)   Collection Time: 04/10/23  1:04 PM   Specimen: BLOOD  Result Value Ref Range Status   Specimen Description   Final    BLOOD RIGHT ANTECUBITAL Performed at [facility], [address] [place], [place] [address]    Special Requests   Final    BOTTLES DRAWN AEROBIC AND ANAEROBIC Blood Culture adequate volume Performed at [facility], [address], [place], [place] [address]    Culture   Final    NO GROWTH 4 DAYS Performed at [facility], [address], [place], [place] [address]    Report Status PENDING  Incomplete  Culture, blood (routine x 2)     Status: None (Preliminary result)   Collection Time: 04/10/23  1:09 PM   Specimen: BLOOD  Result Value Ref Range Status   Specimen Description   Final    BLOOD RIGHT ANTECUBITAL Performed at [facility], [address] [place], [place] [address]    Special Requests   Final    BOTTLES DRAWN AEROBIC AND ANAEROBIC Blood Culture adequate volume Performed at [facility], [address] [place], [place] [address]    Culture   Final    NO GROWTH 4 DAYS Performed at [facility], [address] [place], [place] [address]  Report Status PENDING  Incomplete    Labs: CBC: Recent Labs  Lab 04/10/23 1303 04/11/23 0541 04/11/23 2051 04/12/23 0609 04/12/23 1116 04/12/23 2055 04/13/23 0547  WBC 11.0* 7.4  --   --   --   --   --   NEUTROABS 9.2*  --   --   --   --   --   --   HGB 10.1* 10.4* 10.2* 11.0* 10.9* 9.9* 10.2*  HCT 30.6* 33.3* 32.4* 34.0* 33.1* 31.4* 32.2*  MCV 80.5 83.7  --   --   --   --   --   PLT 245 240  --   --   --   --   --    Basic Metabolic Panel: Recent Labs  Lab 04/10/23 1303 04/10/23 1304 04/11/23 0541 04/12/23 0613 04/13/23 0547  NA 130*  --  141 138 140  K 3.2*  --  3.1* 3.4* 3.6  CL 98  --  102 102 104  CO2 23  --  24 26 23   GLUCOSE 103*  --  99 98 108*  BUN 46*  --  29* 20 13  CREATININE 2.12*  --  1.23* 1.03* 0.86  CALCIUM 7.9*  --  8.6* 8.8* 8.4*  MG  --  2.2  --  2.1  --    Liver Function Tests: Recent Labs  Lab 04/10/23 1303 04/12/23 0613  AST 39 37  ALT 30 43  ALKPHOS 65 68  BILITOT 0.6 0.7  PROT 7.9 7.9  ALBUMIN 3.4*  3.3*   CBG: No results for input(s): "GLUCAP" in the last 168 hours.  Discharge time spent: greater than 30 minutes.  Signed: Alba Cory, MD [facility] 04/14/2023

## 2023-04-15 LAB — CULTURE, BLOOD (ROUTINE X 2)
Culture: NO GROWTH
Special Requests: ADEQUATE
Special Requests: ADEQUATE

## 2023-04-17 ENCOUNTER — Telehealth: Payer: Self-pay

## 2023-04-17 NOTE — Transitions of Care (Post Inpatient/ED Visit) (Signed)
   04/17/2023  Name: Marissa Bowman MRN: [id number] DOB: 04/14/1966  Today's TOC FU Call Status: Today's TOC FU Call Status:: Successful TOC FU Call Competed TOC FU Call Complete Date: 04/17/23  Transition Care Management Follow-up Telephone Call Date of Discharge: 04/14/23 Discharge Facility: [facility]) Type of Discharge: Inpatient Admission Primary Inpatient Discharge Diagnosis:: cellulitis How have you been since you were released from the hospital?: Better Any questions or concerns?: No  Items Reviewed: Did you receive and understand the discharge instructions provided?: Yes Medications obtained,verified, and reconciled?: Yes (Medications Reviewed) Any new allergies since your discharge?: No Dietary orders reviewed?: Yes Do you have support at home?: No  Medications Reviewed Today: Medications Reviewed Today     Reviewed by Karena Addison, LPN (Licensed Practical Nurse) on 04/17/23 at 1145  Med List Status: <None>   Medication Order Taking? Sig Documenting Provider Last Dose Status Informant  amoxicillin-clavulanate (AUGMENTIN) 875-125 MG tablet [id number]  Take 1 tablet by mouth 2 (two) times daily for 7 days. Regalado, Belkys A, MD  Active   carvedilol (COREG) 6.25 MG tablet [id number] No TAKE ONE TABLET BY MOUTH TWICE A DAY WITH A MEAL  Patient taking differently: Take 6.25 mg by mouth 2 (two) times daily with a meal.   Sandford Craze, NP 04/07/2023 Active Self, Pharmacy Records  ferrous sulfate 325 (65 FE) MG tablet [id number] No Take 1 tablet (325 mg total) by mouth 2 (two) times daily with a meal. Sandford Craze, NP Past Week Expired 04/11/23 2359 Self, Pharmacy Records  pantoprazole (PROTONIX) 40 MG tablet [id number]  Take 1 tablet (40 mg total) by mouth daily. Regalado, Belkys A, MD  Active   sulfamethoxazole-trimethoprim (BACTRIM DS) 800-160 MG tablet [id number]  Take 1 tablet by mouth 2 (two) times daily for 7 days. Regalado, Prentiss Bells, MD  Active              Home Care and Equipment/Supplies: Were Home Health Services Ordered?: NA Any new equipment or medical supplies ordered?: NA  Functional Questionnaire: Do you need assistance with bathing/showering or dressing?: No Do you need assistance with meal preparation?: No Do you need assistance with eating?: No Do you have difficulty maintaining continence: No Do you need assistance with getting out of bed/getting out of a chair/moving?: No Do you have difficulty managing or taking your medications?: No  Follow up appointments reviewed: PCP Follow-up appointment confirmed?: Yes Date of PCP follow-up appointment?: 04/21/23 Follow-up Provider: [facility] Follow-up appointment confirmed?: NA Do you need transportation to your follow-up appointment?: No Do you understand care options if your condition(s) worsen?: Yes-patient verbalized understanding    SIGNATURE Karena Addison, LPN [facility] Nurse Health Advisor Direct Dial [phone]

## 2023-04-21 ENCOUNTER — Ambulatory Visit (INDEPENDENT_AMBULATORY_CARE_PROVIDER_SITE_OTHER): Payer: BC Managed Care – PPO | Admitting: Family

## 2023-04-21 VITALS — BP 134/60 | HR 73 | Temp 97.9°F | Resp 18 | Ht 64.0 in | Wt 294.0 lb

## 2023-04-21 DIAGNOSIS — I10 Essential (primary) hypertension: Secondary | ICD-10-CM | POA: Diagnosis not present

## 2023-04-21 DIAGNOSIS — S81802S Unspecified open wound, left lower leg, sequela: Secondary | ICD-10-CM | POA: Insufficient documentation

## 2023-04-21 DIAGNOSIS — E876 Hypokalemia: Secondary | ICD-10-CM

## 2023-04-21 DIAGNOSIS — K253 Acute gastric ulcer without hemorrhage or perforation: Secondary | ICD-10-CM

## 2023-04-21 DIAGNOSIS — L0390 Cellulitis, unspecified: Secondary | ICD-10-CM

## 2023-04-21 LAB — CBC WITH DIFFERENTIAL/PLATELET
Basophils Absolute: 0 10*3/uL (ref 0.0–0.1)
Basophils Relative: 0.4 % (ref 0.0–3.0)
Eosinophils Absolute: 0.2 10*3/uL (ref 0.0–0.7)
Eosinophils Relative: 2.5 % (ref 0.0–5.0)
HCT: 30.5 % — ABNORMAL LOW (ref 36.0–46.0)
Hemoglobin: 9.8 g/dL — ABNORMAL LOW (ref 12.0–15.0)
Lymphocytes Relative: 15.4 % (ref 12.0–46.0)
Lymphs Abs: 1 10*3/uL (ref 0.7–4.0)
MCHC: 32 g/dL (ref 30.0–36.0)
MCV: 80.3 fl (ref 78.0–100.0)
Monocytes Absolute: 0.3 10*3/uL (ref 0.1–1.0)
Monocytes Relative: 4.5 % (ref 3.0–12.0)
Neutro Abs: 4.9 10*3/uL (ref 1.4–7.7)
Neutrophils Relative %: 77.2 % — ABNORMAL HIGH (ref 43.0–77.0)
Platelets: 267 10*3/uL (ref 150.0–400.0)
RBC: 3.79 Mil/uL — ABNORMAL LOW (ref 3.87–5.11)
RDW: 15.1 % (ref 11.5–15.5)
WBC: 6.4 10*3/uL (ref 4.0–10.5)

## 2023-04-21 LAB — COMPREHENSIVE METABOLIC PANEL
ALT: 15 U/L (ref 0–35)
AST: 15 U/L (ref 0–37)
Albumin: 3.6 g/dL (ref 3.5–5.2)
Alkaline Phosphatase: 54 U/L (ref 39–117)
BUN: 12 mg/dL (ref 6–23)
CO2: 27 mEq/L (ref 19–32)
Calcium: 8.8 mg/dL (ref 8.4–10.5)
Chloride: 104 mEq/L (ref 96–112)
Creatinine, Ser: 1.08 mg/dL (ref 0.40–1.20)
GFR: 56.27 mL/min — ABNORMAL LOW (ref >60.00)
Glucose, Bld: 75 mg/dL (ref 70–99)
Potassium: 3.6 mEq/L (ref 3.5–5.1)
Sodium: 141 mEq/L (ref 135–145)
Total Bilirubin: 0.4 mg/dL (ref 0.2–1.2)
Total Protein: 6.8 g/dL (ref 6.0–8.3)

## 2023-04-21 MED ORDER — AMOXICILLIN-POT CLAVULANATE 875-125 MG PO TABS: 1.0000 | ORAL_TABLET | Freq: Two times a day (BID) | ORAL | 0 refills | Status: AC

## 2023-04-21 MED ORDER — CARVEDILOL 6.25 MG PO TABS: 6.2500 mg | ORAL_TABLET | Freq: Two times a day (BID) | ORAL | 1 refills | Status: DC

## 2023-04-21 MED ORDER — SULFAMETHOXAZOLE-TRIMETHOPRIM 800-160 MG PO TABS: 1.0000 | ORAL_TABLET | Freq: Two times a day (BID) | ORAL | 0 refills | Status: DC

## 2023-04-21 NOTE — Assessment & Plan Note (Signed)
Advised pt to continue PPI and avoid NSAIDS. Will arrange follow up with GI.

## 2023-04-21 NOTE — Patient Instructions (Addendum)
Please call [facility] to set up your account.  [phone]  VISIT SUMMARY:  During your recent visit, we discussed your recent hospitalization and the progress of your recovery. You reported feeling better and noted that your left leg, which was treated for cellulitis, is still red and swollen. We discussed your ongoing treatment for cellulitis, erosive gastritis, acute kidney injury, hypotension, and hypokalemia.  YOUR PLAN:  -CELLULITIS: Cellulitis is a skin infection that can cause redness and swelling. We will extend your antibiotics for 5 more days and re-evaluate your leg on Monday.  -EROSIVE GASTRITIS: Erosive gastritis is a condition that causes the lining of your stomach to become inflamed or worn away. You should continue taking your prescribed Pantoprazole antacid medication as recommended by your gastroenterologist.  -ACUTE KIDNEY INJURY: Acute kidney injury is a sudden episode of kidney failure or kidney damage. We will continue to monitor your kidney function.  -HYPOTENSION: Hypotension is a condition where your blood pressure is lower than normal. Now that your blood pressure has improved, you should resume taking your Carvedilol medication.  -HYPOKALEMIA: Hypokalemia is a condition where you have low potassium levels in your blood. We will continue to monitor your potassium levels.  INSTRUCTIONS:  Please repeat your sodium level test today. You should also follow up with Dr. Lavon Paganini for your gastroenterology needs. Make sure to check your follow-up labs as well.

## 2023-04-21 NOTE — Progress Notes (Signed)
Subjective:     Patient ID: Marissa Bowman, female    DOB: 1966-08-30, 57 y.o.   MRN: [id number]  Chief Complaint  Patient presents with   Hospitalization Follow-up    HPI  Discussed the use of AI scribe software for clinical note transcription with the patient, who gave verbal consent to proceed.  History of Present Illness   The patient, a 57 year old female with a history of melanoma, was seen in the office on June 24th. At that time, she was experiencing shortness of breath and was hypotensive. She was sent to the emergency department for further evaluation. She was admitted and found to have acute kidney injury, acute gastric erosion (per endoscopy on 6/26), and lower extremity cellulitis. She was treated with vancomycin and ceftriaxone for the cellulitis, and her redness improved. She was discharged home on a seven day course of Augmentin and Bactrim.  Her creatinine was 2.1 on admission, likely due to hypovolemia and an acute GI bleed. She was given fluids and her discharge creatinine was 1.2. Her carvedilol was resumed at time of discharge as her blood pressure was improving. She was also treated for hypokalemia during her hospitalization.  Today, she reports feeling better than at her last visit. She notes that she felt unwell when she went to the grocery store on the day of her last visit, but has since improved. She reports that her left leg, which was treated for cellulitis during her hospital stay, is still red and swollen. She has been taking two different antibiotics since her discharge and has one more pill left. She denies any fevers since being home.          There are no preventive care reminders to display for this patient.  Past Medical History:  Diagnosis Date   Anemia    on iron   Asthma    childhood   Hypertension     Past Surgical History:  Procedure Laterality Date   BIOPSY  10/12/2021   Procedure: BIOPSY;  Surgeon: Napoleon Form, MD;  Location:  [facility];  Service: Endoscopy;;   COLONOSCOPY     COLONOSCOPY WITH PROPOFOL N/A 02/03/2017   Procedure: COLONOSCOPY WITH PROPOFOL;  Surgeon: Napoleon Form, MD;  Location: [facility];  Service: Endoscopy;  Laterality: N/A;   COLONOSCOPY WITH PROPOFOL N/A 10/12/2021   Procedure: COLONOSCOPY WITH PROPOFOL;  Surgeon: Napoleon Form, MD;  Location: [facility];  Service: Endoscopy;  Laterality: N/A;   ESOPHAGOGASTRODUODENOSCOPY N/A 04/12/2023   Procedure: ESOPHAGOGASTRODUODENOSCOPY (EGD);  Surgeon: Hilarie Fredrickson, MD;  [facility];  Service: Gastroenterology;  Laterality: N/A;   ESOPHAGOGASTRODUODENOSCOPY (EGD) WITH PROPOFOL N/A 10/12/2021   Procedure: ESOPHAGOGASTRODUODENOSCOPY (EGD) WITH PROPOFOL;  Surgeon: Napoleon Form, MD;  Location: [facility];  Service: Endoscopy;  Laterality: N/A;   NO PAST SURGERIES     POLYPECTOMY  10/12/2021   Procedure: POLYPECTOMY;  Surgeon: Napoleon Form, MD;  Location: [facility];  Service: Endoscopy;;    Family History  Problem Relation Age of Onset   Hypertension Mother    Hyperlipidemia Mother    Diabetes Mother    Lung cancer Father    Thyroid disease Sister    Fibromyalgia Sister    Colon cancer Neg Hx     Social History   Socioeconomic History   Marital status: Single    Spouse name: Not on file   Number of children: Not on file   Years of education: Not on file  Highest education level: Associate degree: occupational, Scientist, product/process development, or vocational program  Occupational History   Not on file  Tobacco Use   Smoking status: Never   Smokeless tobacco: Never  Vaping Use   Vaping Use: Never used  Substance and Sexual Activity   Alcohol use: No   Drug use: No   Sexual activity: Not on file  Other Topics Concern   Not on file  Social History Narrative   1 son- age 80   Unemployed, plans to sub with the [employer]   Associated degree   Lives with mother father/father and son   Has 1 dog lab/spaniel mix    Enjoys reading   Social Determinants of Health   Financial Resource Strain: Low Risk  (04/06/2023)   Overall Financial Resource Strain (CARDIA)    Difficulty of Paying Living Expenses: Not very hard  Food Insecurity: Food Insecurity Present (04/10/2023)   Hunger Vital Sign    Worried About Running Out of Food in the Last Year: Sometimes true    Ran Out of Food in the Last Year: Sometimes true  Transportation Needs: No Transportation Needs (04/10/2023)   PRAPARE - Administrator, Civil Service (Medical): No    Lack of Transportation (Non-Medical): No  Physical Activity: Insufficiently Active (04/06/2023)   Exercise Vital Sign    Days of Exercise per Week: 4 days    Minutes of Exercise per Session: 30 min  Stress: No Stress Concern Present (04/06/2023)   [employer] of Occupational Health - Occupational Stress Questionnaire    Feeling of Stress : Not at all  Social Connections: Moderately Integrated (04/06/2023)   Social Connection and Isolation Panel [NHANES]    Frequency of Communication with Friends and Family: Once a week    Frequency of Social Gatherings with Friends and Family: Twice a week    Attends Religious Services: 1 to 4 times per year    Active Member of [employer] or Organizations: Yes    Attends Banker Meetings: 1 to 4 times per year    Marital Status: Never married  Intimate Partner Violence: Not At Risk (04/10/2023)   Humiliation, Afraid, Rape, and Kick questionnaire    Fear of Current or Ex-Partner: No    Emotionally Abused: No    Physically Abused: No    Sexually Abused: No    Outpatient Medications Prior to Visit  Medication Sig Dispense Refill   ferrous sulfate 325 (65 FE) MG tablet Take 1 tablet (325 mg total) by mouth 2 (two) times daily with a meal. 60 tablet 2   pantoprazole (PROTONIX) 40 MG tablet Take 1 tablet (40 mg total) by mouth daily. 30 tablet 1   amoxicillin-clavulanate (AUGMENTIN) 875-125 MG tablet Take 1 tablet by  mouth 2 (two) times daily for 7 days. 14 tablet 0   carvedilol (COREG) 6.25 MG tablet TAKE ONE TABLET BY MOUTH TWICE A DAY WITH A MEAL (Patient taking differently: Take 6.25 mg by mouth 2 (two) times daily with a meal.) 60 tablet 3   sulfamethoxazole-trimethoprim (BACTRIM DS) 800-160 MG tablet Take 1 tablet by mouth 2 (two) times daily for 7 days. 14 tablet 0   No facility-administered medications prior to visit.    Allergies  Allergen Reactions   Latex Rash    Latex gloves with powder cause a rash    ROS     Objective:    Physical Exam Constitutional:      General: She is not in acute distress.  Appearance: Normal appearance. She is well-developed.  HENT:     Head: Normocephalic and atraumatic.     Right Ear: External ear normal.     Left Ear: External ear normal.  Eyes:     General: No scleral icterus. Neck:     Thyroid: No thyromegaly.  Cardiovascular:     Rate and Rhythm: Normal rate and regular rhythm.     Heart sounds: Normal heart sounds. No murmur heard. Pulmonary:     Effort: Pulmonary effort is normal. No respiratory distress.     Breath sounds: Normal breath sounds. No wheezing.  Musculoskeletal:     Cervical back: Neck supple.  Skin:    General: Skin is warm and dry.     Comments: Blisters noted overlying cellulitis of LLE with associated erythema and a few open sores.    Neurological:     Mental Status: She is alert and oriented to person, place, and time.  Psychiatric:        Mood and Affect: Mood normal.        Behavior: Behavior normal.        Thought Content: Thought content normal.        Judgment: Judgment normal.     ]  BP 134/60   Pulse 73   Temp 97.9 F (36.6 C)   Resp 18   Ht 5\' 4"  (1.626 m)   Wt 294 lb (133.4 kg)   LMP 01/27/2017   SpO2 100%   BMI 50.46 kg/m  Wt Readings from Last 3 Encounters:  04/21/23 294 lb (133.4 kg)  04/12/23 287 lb (130.2 kg)  04/10/23 287 lb (130.2 kg)       Assessment & Plan:   Problem List  Items Addressed This Visit       Unprioritized   HTN (hypertension)    BP looks good back on carvedilol, continue same.       Relevant Medications   carvedilol (COREG) 6.25 MG tablet   Cellulitis - Primary    Erythema seems to be improving overall but I think that her edema is contributing to her LE blisters. Wounds were dressed with antibiotic ointment, non-adherent Telfa and kerlex.  Advised pt to continue augmentin/bactrim over the weekend, follow back up with me on Monday for re-evaluation and go to the ER if she develops increased pain/redness/swelling or fever.        Relevant Orders   CBC w/Diff   Acute gastric erosion    Advised pt to continue PPI and avoid NSAIDS. Will arrange follow up with GI.       Relevant Orders   Ambulatory referral to Gastroenterology   Other Visit Diagnoses     Hypokalemia       Relevant Orders   Comp Met (CMET)       I have changed Kennith Gain. Beckles's carvedilol and amoxicillin-clavulanate. I am also having her maintain her ferrous sulfate, pantoprazole, and sulfamethoxazole-trimethoprim.  Meds ordered this encounter  Medications   sulfamethoxazole-trimethoprim (BACTRIM DS) 800-160 MG tablet    Sig: Take 1 tablet by mouth 2 (two) times daily for 7 days.    Dispense:  10 tablet    Refill:  0    Order Specific Question:   Supervising Provider    Answer:   Danise Edge A [[id number]]   carvedilol (COREG) 6.25 MG tablet    Sig: Take 1 tablet (6.25 mg total) by mouth 2 (two) times daily with a meal. TAKE ONE TABLET BY MOUTH TWICE A  DAY WITH A MEAL Strength: 6.25 mg    Dispense:  180 tablet    Refill:  1    Order Specific Question:   Supervising Provider    Answer:   Danise Edge A [[id number]]   amoxicillin-clavulanate (AUGMENTIN) 875-125 MG tablet    Sig: Take 1 tablet by mouth 2 (two) times daily for 5 days.    Dispense:  10 tablet    Refill:  0    Order Specific Question:   Supervising Provider    Answer:   Danise Edge A [[id number]]

## 2023-04-21 NOTE — Assessment & Plan Note (Signed)
Erythema seems to be improving overall but I think that her edema is contributing to her LE blisters. Wounds were dressed with antibiotic ointment, non-adherent Telfa and kerlex.  Advised pt to continue augmentin/bactrim over the weekend, follow back up with me on Monday for re-evaluation and go to the ER if she develops increased pain/redness/swelling or fever.

## 2023-04-21 NOTE — Assessment & Plan Note (Signed)
BP looks good back on carvedilol, continue same.

## 2023-04-22 ENCOUNTER — Encounter: Payer: Self-pay | Admitting: Family

## 2023-04-22 ENCOUNTER — Other Ambulatory Visit: Payer: Self-pay | Admitting: Family

## 2023-04-24 ENCOUNTER — Ambulatory Visit: Payer: BC Managed Care – PPO | Admitting: Family

## 2023-04-24 MED ORDER — SULFAMETHOXAZOLE-TRIMETHOPRIM 800-160 MG PO TABS: 1.0000 | ORAL_TABLET | Freq: Two times a day (BID) | ORAL | 0 refills | Status: DC

## 2023-04-25 ENCOUNTER — Other Ambulatory Visit: Payer: Self-pay | Admitting: Family

## 2023-04-26 ENCOUNTER — Ambulatory Visit (HOSPITAL_BASED_OUTPATIENT_CLINIC_OR_DEPARTMENT_OTHER)
Admission: RE | Admit: 2023-04-26 | Discharge: 2023-04-26 | Disposition: A | Discharge status: Home / Self Care | Payer: BC Managed Care – PPO | Source: Ambulatory Visit | Attending: Family | Admitting: Family

## 2023-04-26 DIAGNOSIS — R0602 Shortness of breath: Secondary | ICD-10-CM | POA: Diagnosis present

## 2023-04-26 LAB — ECHOCARDIOGRAM COMPLETE
AR max vel: 2.28 cm2
AV Area VTI: 2.25 cm2
AV Area mean vel: 2.35 cm2
AV Mean grad: 9 mmHg
AV Peak grad: 20.1 mmHg
Ao pk vel: 2.24 m/s
Area-P 1/2: 3.48 cm2
S' Lateral: 3.1 cm

## 2023-04-28 ENCOUNTER — Encounter (HOSPITAL_BASED_OUTPATIENT_CLINIC_OR_DEPARTMENT_OTHER): Payer: Self-pay

## 2023-04-28 ENCOUNTER — Other Ambulatory Visit: Payer: Self-pay

## 2023-04-28 ENCOUNTER — Emergency Department (HOSPITAL_BASED_OUTPATIENT_CLINIC_OR_DEPARTMENT_OTHER)
Admission: EM | Admit: 2023-04-28 | Discharge: 2023-04-28 | Disposition: A | Discharge status: Home / Self Care | Payer: BC Managed Care – PPO | Attending: Emergency Medicine | Admitting: Emergency Medicine

## 2023-04-28 ENCOUNTER — Telehealth: Payer: Self-pay | Admitting: Family

## 2023-04-28 DIAGNOSIS — L03116 Cellulitis of left lower limb: Secondary | ICD-10-CM | POA: Diagnosis present

## 2023-04-28 DIAGNOSIS — Z79899 Other long term (current) drug therapy: Secondary | ICD-10-CM | POA: Diagnosis not present

## 2023-04-28 DIAGNOSIS — I10 Essential (primary) hypertension: Secondary | ICD-10-CM | POA: Insufficient documentation

## 2023-04-28 DIAGNOSIS — Z91040 Latex allergy status: Secondary | ICD-10-CM | POA: Insufficient documentation

## 2023-04-28 LAB — CBC
HCT: 30.1 % — ABNORMAL LOW (ref 36.0–46.0)
Hemoglobin: 9.5 g/dL — ABNORMAL LOW (ref 12.0–15.0)
MCH: 26 pg (ref 26.0–34.0)
MCHC: 31.6 g/dL (ref 30.0–36.0)
MCV: 82.5 fL (ref 80.0–100.0)
Platelets: 275 10*3/uL (ref 150–400)
RBC: 3.65 MIL/uL — ABNORMAL LOW (ref 3.87–5.11)
RDW: 14.4 % (ref 11.5–15.5)
WBC: 6.5 10*3/uL (ref 4.0–10.5)
nRBC: 0 % (ref 0.0–0.2)

## 2023-04-28 LAB — COMPREHENSIVE METABOLIC PANEL
ALT: 15 U/L (ref 0–44)
AST: 13 U/L — ABNORMAL LOW (ref 15–41)
Albumin: 3.3 g/dL — ABNORMAL LOW (ref 3.5–5.0)
Alkaline Phosphatase: 63 U/L (ref 38–126)
Anion gap: 11 (ref 5–15)
BUN: 11 mg/dL (ref 6–20)
CO2: 28 mmol/L (ref 22–32)
Calcium: 8.4 mg/dL — ABNORMAL LOW (ref 8.9–10.3)
Chloride: 104 mmol/L (ref 98–111)
Creatinine, Ser: 1.15 mg/dL — ABNORMAL HIGH (ref 0.44–1.00)
GFR, Estimated: 55 mL/min — ABNORMAL LOW (ref >60)
Glucose, Bld: 93 mg/dL (ref 70–99)
Potassium: 3.1 mmol/L — ABNORMAL LOW (ref 3.5–5.1)
Sodium: 143 mmol/L (ref 135–145)
Total Bilirubin: 0.5 mg/dL (ref 0.3–1.2)
Total Protein: 7.4 g/dL (ref 6.5–8.1)

## 2023-04-28 MED ORDER — VANCOMYCIN HCL IN DEXTROSE 1-5 GM/200ML-% IV SOLN
1000.0000 mg | Freq: Once | INTRAVENOUS | Status: AC
  Administered 2023-04-28: 1000 mg via INTRAVENOUS
  Filled 2023-04-28: qty 200

## 2023-04-28 MED ORDER — DOXYCYCLINE HYCLATE 100 MG PO CAPS: 100.0000 mg | ORAL_CAPSULE | Freq: Two times a day (BID) | ORAL | 0 refills | Status: AC

## 2023-04-28 MED ORDER — VANCOMYCIN HCL 2000 MG/400ML IV SOLN: 2000.0000 mg | Freq: Once | INTRAVENOUS | Status: DC

## 2023-04-28 MED ORDER — SODIUM CHLORIDE 0.9 % IV SOLN: INTRAVENOUS | Status: DC | PRN

## 2023-04-28 MED ORDER — SODIUM CHLORIDE 0.9 % IV SOLN
2.0000 g | Freq: Once | INTRAVENOUS | Status: AC
  Administered 2023-04-28: 2 g via INTRAVENOUS
  Filled 2023-04-28: qty 20

## 2023-04-28 NOTE — Discharge Instructions (Addendum)
You have been prescribed doxycycline (Vibramycin), which is an antibiotic.  Please pick this medication up at your pharmacy and take the full course of this medication. Take 1 tablet every 12 hours for the next 7 days.  Continue to take your antibiotic even if you start feeling better.  This medication may make your skin more sensitive to sunlight.  Please avoid being in the sun and use sunscreen when in the sun. Antibiotics may also cause you to have diarrhea.   Stop taking Bactrim (Sulfamethoxazole-trimethoprim), the antibiotic you were prescribed after your hospital stay.  Keep the wounds on your left leg clean and covered.  Follow-up with your PCP in 1 week to ensure your cellulitis is improving.  Return to the ER if you have any fever, chills, spreading of your rash, pus discharge from your wounds, severe pain, any other new or concerning symptoms.

## 2023-04-28 NOTE — Telephone Encounter (Signed)
In her regards to her message [id number] leg swelling/redness- I would recommend that she go to the ER because she may need IV antibiotics again.

## 2023-04-28 NOTE — ED Notes (Signed)
Patients affected leg was wrapped in Xeroform and then wrapped in Kerlix.

## 2023-04-28 NOTE — ED Triage Notes (Signed)
Patient stated she is being treated for cellulitis of her left leg. She stated she is not getting better after the antibiotics.

## 2023-04-28 NOTE — ED Provider Notes (Signed)
[facility] Provider Note   CSN: [id number] Arrival date & time: 04/28/23  1509     History  Chief Complaint  Patient presents with   Wound Check    Marissa Bowman is a 57 y.o. female presents with concern of left lower extremity cellulitis.  She recently was in the hospital for 1 week of IV antibiotics (vancomycin and ceftriaxone).  She was discharged on 04/14/2023 and was supposed to be taking 7 days of Augmentin and Bactrim at her discharge.  She reports she was not able to get these medications and therefore has not been taking antibiotics since she has been out of the hospital.  She comes in with concern that her cellulitis has not improved.  Also notes swelling of her left calf and foot.  No calf pain.  No fevers or chills.  She states the lesions that are present currently are the ones that were there in the hospital and it has not spread.   Wound Check       Home Medications Prior to Admission medications   Medication Sig Start Date End Date Taking? Authorizing Provider  carvedilol (COREG) 6.25 MG tablet Take 1 tablet (6.25 mg total) by mouth 2 (two) times daily with a meal. TAKE ONE TABLET BY MOUTH TWICE A DAY WITH A MEAL Strength: 6.25 mg 04/21/23   Sandford Craze, NP  ferrous sulfate 325 (65 FE) MG tablet Take 1 tablet (325 mg total) by mouth 2 (two) times daily with a meal. 04/28/22 04/11/23  Sandford Craze, NP  pantoprazole (PROTONIX) 40 MG tablet Take 1 tablet (40 mg total) by mouth daily. 04/14/23 04/13/24  Regalado, Jon Billings A, MD  sulfamethoxazole-trimethoprim (BACTRIM DS) 800-160 MG tablet Take 1 tablet by mouth 2 (two) times daily for 5 days. 04/24/23 04/29/23  Sandford Craze, NP      Allergies    Latex    Review of Systems   Review of Systems  Constitutional:  Negative for fever.  Skin:  Positive for rash and wound.    Physical Exam Updated Vital Signs BP (!) 165/79 (BP Location: Right Arm)   Pulse 61    Temp 98.2 F (36.8 C) (Oral)   Resp 20   Ht 5\' 4"  (1.626 m)   Wt 132.9 kg   LMP 01/27/2017   SpO2 97%   BMI 50.29 kg/m  Physical Exam Vitals and nursing note reviewed.  Constitutional:      General: She is not in acute distress.    Appearance: She is obese.  Cardiovascular:     Rate and Rhythm: Normal rate and regular rhythm.     Heart sounds: No murmur heard.    Comments: Dorsalis pedis and posterior tibialis pulses 2+ bilaterally Pulmonary:     Effort: Pulmonary effort is normal. No respiratory distress.     Breath sounds: Normal breath sounds.  Musculoskeletal:     Cervical back: Normal range of motion.     Comments: Moderate edema of the right calf and foot Open lesions of the right calf. No pus drainage  Mild edema of the left lower extremity, no lesions or wounds.  No tenderness to palpation of the right or left calf  Neurological:     General: No focal deficit present.     Mental Status: She is alert.  Psychiatric:        Mood and Affect: Mood normal.            ED Results /  Procedures / Treatments   Labs (all labs ordered are listed, but only abnormal results are displayed) Labs Reviewed - No data to display  EKG None  Radiology No results found.  Procedures Procedures    Medications Ordered in ED Medications - No data to display  ED Course/ Medical Decision Making/ A&P                             Medical Decision Making Amount and/or Complexity of Data Reviewed Labs: ordered.  Risk Prescription drug management.   57 y.o. female with pertinent past medical history of hypertension presents to the ED for concern of left leg cellulitis  Differential diagnosis includes but is not limited to cellulitis, septicemia, osteomyelitis Obvious open wounds of the left calf, no pus drainage. Concern for ongoing cellulitis given recent admission for LLE cellulitis Patient could have septicemia, although lower suspicion given no fevers or  chills, will check blood cultures. Lower suspicion for osteomyelitis given wounds seem superficial but should consider given non-resolution of wounds.  ED Course:  Patient completed 1 week of IV ceftriaxone and vancomycin and was discharged on 04/14/2023.  She was supposed to take 7 days of Augmentin and Bactrim upon discharge.  However she reports she was not able to get these medications and has not been taking any antibiotics since discharge.  Her wound has not gotten any better but patient reports that is also not worsened.  No fevers or chills at home.  No calf pain. She is not immunocompromised. Patient given IV ceftriaxone and vancomycin here.  No leukocytosis, no fevers, rash has not spread since discharge.  Will discharge home with 7-day course of doxycycline and instructions to follow-up with her primary care provider in 1 week.  She was informed that if blood cultures come back positive she will receive a call.   Impression: Left calf cellulitis   Disposition:  The patient was discharged home with instructions to take full course of doxycycline and follow-up with primary care provider in 1 week  Return precautions given.  Lab Tests: I Ordered, and personally interpreted labs.  The pertinent results include:   CBC with no leukocytosis  Imaging Studies ordered: Not indicated    Cardiac Monitoring: / EKG: Not indicated   Consultations Obtained: Not indicated   Co morbidities that complicate the patient evaluation  HTN   Social Determinants of Health:  unknown              Final Clinical Impression(s) / ED Diagnoses Final diagnoses:  None    Rx / DC Orders ED Discharge Orders     None         Arabella Merles, PA-C 04/28/23 2039    Elayne Snare K, DO 04/28/23 2338

## 2023-04-28 NOTE — Telephone Encounter (Signed)
Please advise pt that her echo shows a strong heart.  Note is made of increase pulmonary pressure.  This can sometimes be seen in patients who have sleep apnea. I would like to refer her to a sleep specialist for further sleep apnea testing.

## 2023-04-28 NOTE — ED Notes (Signed)
Blood cultures obtained prior to ABX admin 

## 2023-04-28 NOTE — Telephone Encounter (Signed)
Patient notified of results of echo and referral to lung specialists.  She was also advise to go to ED for evaluation and treatment of Cellulitis. She verbalized understanding.

## 2023-05-01 LAB — CULTURE, BLOOD (ROUTINE X 2): Culture: NO GROWTH

## 2023-05-03 LAB — CULTURE, BLOOD (ROUTINE X 2)
Culture: NO GROWTH
Special Requests: ADEQUATE
Special Requests: ADEQUATE

## 2023-05-06 ENCOUNTER — Other Ambulatory Visit: Payer: Self-pay | Admitting: Family

## 2023-05-08 ENCOUNTER — Ambulatory Visit (INDEPENDENT_AMBULATORY_CARE_PROVIDER_SITE_OTHER): Payer: BC Managed Care – PPO | Admitting: Family

## 2023-05-08 VITALS — BP 146/69 | HR 62 | Temp 98.3°F | Resp 16 | Wt 303.0 lb

## 2023-05-08 DIAGNOSIS — I10 Essential (primary) hypertension: Secondary | ICD-10-CM

## 2023-05-08 DIAGNOSIS — L03116 Cellulitis of left lower limb: Secondary | ICD-10-CM

## 2023-05-08 MED ORDER — CARVEDILOL 12.5 MG PO TABS: 12.5000 mg | ORAL_TABLET | Freq: Two times a day (BID) | ORAL | 1 refills | Status: DC

## 2023-05-08 NOTE — Progress Notes (Signed)
Subjective:     Patient ID: Marissa Bowman, female    DOB: 1966/08/08, 57 y.o.   MRN: [id number]  Chief Complaint  Patient presents with   Cellulitis    Here for follow up    HPI  Discussed the use of AI scribe software for clinical note transcription with the patient, who gave verbal consent to proceed.  History of Present Illness   The patient, with a history of hypertension, presents for follow-up of a leg cellulitis/skin wounds. She reports that the wound is less swollen but still painful and the leg feels tight. She has been changing the dressing at home with her son's help. She was previously seen in the ER where she was given IV rocephin  and PO doxycycline, which she did not finish taking. She also reports high blood pressure readings at home. 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 Marland Kitchen She is concerned about her ability to stand on her feet for extended periods.      BP Readings from Last 3 Encounters:  05/08/23 (!) 146/69  04/28/23 (!) 170/76  04/21/23 134/60       There are no preventive care reminders to display for this patient.  Past Medical History:  Diagnosis Date   Anemia    on iron   Asthma    childhood   Hypertension     Past Surgical History:  Procedure Laterality Date   BIOPSY  10/12/2021   Procedure: BIOPSY;  Surgeon: Napoleon Form, MD;  Location: [facility];  Service: Endoscopy;;   COLONOSCOPY     COLONOSCOPY WITH PROPOFOL N/A 02/03/2017   Procedure: COLONOSCOPY WITH PROPOFOL;  Surgeon: Napoleon Form, MD;  Location: [facility];  Service: Endoscopy;  Laterality: N/A;   COLONOSCOPY WITH PROPOFOL N/A 10/12/2021   Procedure: COLONOSCOPY WITH PROPOFOL;  Surgeon: Napoleon Form, MD;  Location: [facility];  Service: Endoscopy;  Laterality: N/A;   ESOPHAGOGASTRODUODENOSCOPY N/A 04/12/2023   Procedure: ESOPHAGOGASTRODUODENOSCOPY (EGD);  Surgeon:  Hilarie Fredrickson, MD;  Location: [facility];  Service: Gastroenterology;  Laterality: N/A;   ESOPHAGOGASTRODUODENOSCOPY (EGD) WITH PROPOFOL N/A 10/12/2021   Procedure: ESOPHAGOGASTRODUODENOSCOPY (EGD) WITH PROPOFOL;  Surgeon: Napoleon Form, MD;  Location: [facility];  Service: Endoscopy;  Laterality: N/A;   NO PAST SURGERIES     POLYPECTOMY  10/12/2021   Procedure: POLYPECTOMY;  Surgeon: Napoleon Form, MD;  Location: [facility];  Service: Endoscopy;;    Family History  Problem Relation Age of Onset   Hypertension Mother    Hyperlipidemia Mother    Diabetes Mother    Lung cancer Father    Thyroid disease Sister    Fibromyalgia Sister    Colon cancer Neg Hx     Social History   Socioeconomic History   Marital status: Single    Spouse name: Not on file   Number of children: Not on file   Years of education: Not on file   Highest education level: Associate degree: occupational, Scientist, product/process development, or vocational program  Occupational History   Not on file  Tobacco Use   Smoking status: Never   Smokeless tobacco: Never  Vaping Use   Vaping status: Never Used  Substance and Sexual Activity   Alcohol use: No   Drug use: No   Sexual activity: Not on file  Other Topics Concern   Not on file  Social History  Narrative   1 son- age 55   Unemployed, plans to sub with the [employer] schools   Associated degree   Lives with mother father/father and son   Has 1 dog lab/spaniel mix   Enjoys reading   Social Determinants of Health   Financial Resource Strain: Low Risk  (04/06/2023)   Overall Financial Resource Strain (CARDIA)    Difficulty of Paying Living Expenses: Not very hard  Food Insecurity: Food Insecurity Present (04/10/2023)   Hunger Vital Sign    Worried About Running Out of Food in the Last Year: Sometimes true    Ran Out of Food in the Last Year: Sometimes true  Transportation Needs: No Transportation Needs (04/10/2023)   PRAPARE - Scientist, research (physical sciences) (Medical): No    Lack of Transportation (Non-Medical): No  Physical Activity: Insufficiently Active (04/06/2023)   Exercise Vital Sign    Days of Exercise per Week: 4 days    Minutes of Exercise per Session: 30 min  Stress: No Stress Concern Present (04/06/2023)   [employer] of Occupational Health - Occupational Stress Questionnaire    Feeling of Stress : Not at all  Social Connections: Moderately Integrated (04/06/2023)   Social Connection and Isolation Panel [NHANES]    Frequency of Communication with Friends and Family: Once a week    Frequency of Social Gatherings with Friends and Family: Twice a week    Attends Religious Services: 1 to 4 times per year    Active Member of [employer] or Organizations: Yes    Attends Banker Meetings: 1 to 4 times per year    Marital Status: Never married  Intimate Partner Violence: Not At Risk (04/10/2023)   Humiliation, Afraid, Rape, and Kick questionnaire    Fear of Current or Ex-Partner: No    Emotionally Abused: No    Physically Abused: No    Sexually Abused: No    Outpatient Medications Prior to Visit  Medication Sig Dispense Refill   pantoprazole (PROTONIX) 40 MG tablet Take 1 tablet (40 mg total) by mouth daily. 30 tablet 1   carvedilol (COREG) 6.25 MG tablet Take 1 tablet (6.25 mg total) by mouth 2 (two) times daily with a meal. TAKE ONE TABLET BY MOUTH TWICE A DAY WITH A MEAL Strength: 6.25 mg 180 tablet 1   ferrous sulfate 325 (65 FE) MG tablet Take 1 tablet (325 mg total) by mouth 2 (two) times daily with a meal. 60 tablet 2   No facility-administered medications prior to visit.    Allergies  Allergen Reactions   Latex Rash    Latex gloves with powder cause a rash    ROS     Objective:    Physical Exam Constitutional:      Appearance: Normal appearance.  Cardiovascular:     Rate and Rhythm: Normal rate.  Pulmonary:     Effort: Pulmonary effort is normal.  Skin:    General: Skin is warm and  dry.     Comments: Multiple superficial skin wounds noted LLE, resolution of surrounding erythema  Neurological:     Mental Status: She is alert.       BP (!) 146/69   Pulse 62   Temp 98.3 F (36.8 C) (Oral)   Resp 16   Wt (!) 303 lb (137.4 kg)   LMP 01/27/2017   SpO2 99%   BMI 52.01 kg/m  Wt Readings from Last 3 Encounters:  05/08/23 (!) 303 lb (137.4 kg)  04/28/23  293 lb (132.9 kg)  04/21/23 294 lb (133.4 kg)       Assessment & Plan:   Problem List Items Addressed This Visit       Unprioritized   HTN (hypertension)    Uncontrolled. Will increase carvedilol from 6.25 bid to 12 mg bid.       Relevant Medications   carvedilol (COREG) 12.5 MG tablet   Cellulitis - Primary    Overall appears improved.  No obvious sign of infection.  Wound was dressed by myself today in the clinic. She will continue daily dressing changes.  Follow up in 1 week.      30 minutes spent on today's visit. Time was spent counseling pt on hypertension and providing wound care.  I have discontinued Kennith Gain. Martinez's carvedilol. I am also having her start on carvedilol. Additionally, I am having her maintain her ferrous sulfate and pantoprazole.  Meds ordered this encounter  Medications   carvedilol (COREG) 12.5 MG tablet    Sig: Take 1 tablet (12.5 mg total) by mouth 2 (two) times daily with a meal.    Dispense:  180 tablet    Refill:  1    Order Specific Question:   Supervising Provider    Answer:   Danise Edge A [[id number]]

## 2023-05-08 NOTE — Assessment & Plan Note (Signed)
Overall appears improved.  No obvious sign of infection.  Wound was dressed by myself today in the clinic. She will continue daily dressing changes.  Follow up in 1 week.

## 2023-05-08 NOTE — Assessment & Plan Note (Signed)
Uncontrolled. Will increase carvedilol from 6.25 bid to 12 mg bid.

## 2023-05-09 ENCOUNTER — Other Ambulatory Visit: Payer: Self-pay

## 2023-05-09 MED ORDER — PANTOPRAZOLE SODIUM 40 MG PO TBEC: 40.0000 mg | DELAYED_RELEASE_TABLET | Freq: Every day | ORAL | 0 refills | Status: DC

## 2023-05-15 ENCOUNTER — Ambulatory Visit: Payer: BC Managed Care – PPO | Admitting: Family

## 2023-05-15 VITALS — BP 178/75 | HR 62 | Temp 98.4°F | Resp 16

## 2023-05-15 DIAGNOSIS — I10 Essential (primary) hypertension: Secondary | ICD-10-CM

## 2023-05-15 DIAGNOSIS — S81802S Unspecified open wound, left lower leg, sequela: Secondary | ICD-10-CM

## 2023-05-15 DIAGNOSIS — Z4800 Encounter for change or removal of nonsurgical wound dressing: Secondary | ICD-10-CM | POA: Diagnosis not present

## 2023-05-15 MED ORDER — FUROSEMIDE 20 MG PO TABS: ORAL_TABLET | ORAL | 0 refills | Status: DC

## 2023-05-15 NOTE — Assessment & Plan Note (Signed)
Uncontrolled. Advised pt to add the PM dose of carvedilol. Will repeat BP in 1 week.  I did add furosemide 20 mg once daily prn for her LE edema.  Hopefully this will enable her to get her shoes back on so she can return to work on 05/23/23.

## 2023-05-15 NOTE — Progress Notes (Signed)
Subjective:     Patient ID: Marissa Bowman, female    DOB: [birth year], 57 y.o.   MRN: [id number]  No chief complaint on file.   HPI  Discussed the use of AI scribe software for clinical note transcription with the patient, who gave verbal consent to proceed.  History of Present Illness          Patient presents today for follow up of her left leg wound and her blood pressure.  Leg wounds- continues daily dressing changes. Notes that the skin is peeling on her feet/ankles.  Still having some swelling in the LLE which is making her unable to wear her regular shoes.   BP Readings from Last 3 Encounters:  05/15/23 (!) 178/75  05/08/23 (!) 146/69  04/28/23 (!) 170/76   HTN- last visit we increased her carvedilol from 12.5 mg bid to 25 mg bid.  Pt states she has only been taking in the AM.    There are no preventive care reminders to display for this patient.  Past Medical History:  Diagnosis Date   Anemia    on iron   Asthma    childhood   Hypertension     Past Surgical History:  Procedure Laterality Date   BIOPSY  10/12/2021   Procedure: BIOPSY;  Surgeon: Napoleon Form, MD;  Location: [facility];  Service: Endoscopy;;   COLONOSCOPY     COLONOSCOPY WITH PROPOFOL N/A 02/03/2017   Procedure: COLONOSCOPY WITH PROPOFOL;  Surgeon: Napoleon Form, MD;  Location: [facility];  Service: Endoscopy;  Laterality: N/A;   COLONOSCOPY WITH PROPOFOL N/A 10/12/2021   Procedure: COLONOSCOPY WITH PROPOFOL;  Surgeon: Napoleon Form, MD;  Location: [facility];  Service: Endoscopy;  Laterality: N/A;   ESOPHAGOGASTRODUODENOSCOPY N/A 04/12/2023   Procedure: ESOPHAGOGASTRODUODENOSCOPY (EGD);  Surgeon: Hilarie Fredrickson, MD;  Location: [facility];  Service: Gastroenterology;  Laterality: N/A;   ESOPHAGOGASTRODUODENOSCOPY (EGD) WITH PROPOFOL N/A 10/12/2021   Procedure: ESOPHAGOGASTRODUODENOSCOPY (EGD) WITH PROPOFOL;  Surgeon: Napoleon Form, MD;  Location: [facility];   Service: Endoscopy;  Laterality: N/A;   NO PAST SURGERIES     POLYPECTOMY  10/12/2021   Procedure: POLYPECTOMY;  Surgeon: Napoleon Form, MD;  Location: [facility];  Service: Endoscopy;;    Family History  Problem Relation Age of Onset   Hypertension Mother    Hyperlipidemia Mother    Diabetes Mother    Lung cancer Father    Thyroid disease Sister    Fibromyalgia Sister    Colon cancer Neg Hx     Social History   Socioeconomic History   Marital status: Single    Spouse name: Not on file   Number of children: Not on file   Years of education: Not on file   Highest education level: Associate degree: occupational, Scientist, product/process development, or vocational program  Occupational History   Not on file  Tobacco Use   Smoking status: Never   Smokeless tobacco: Never  Vaping Use   Vaping status: Never Used  Substance and Sexual Activity   Alcohol use: No   Drug use: No   Sexual activity: Not on file  Other Topics Concern   Not on file  Social History Narrative   1 son- age 47   Unemployed, plans to sub with the [employer] schools   Associated degree   Lives with mother father/father and son   Has 1 dog lab/spaniel mix   Enjoys reading   Social Determinants of Corporate investment banker  Strain: Low Risk  (04/06/2023)   Overall Financial Resource Strain (CARDIA)    Difficulty of Paying Living Expenses: Not very hard  Food Insecurity: Food Insecurity Present (04/10/2023)   Hunger Vital Sign    Worried About Running Out of Food in the Last Year: Sometimes true    Ran Out of Food in the Last Year: Sometimes true  Transportation Needs: No Transportation Needs (04/10/2023)   PRAPARE - Administrator, Civil Service (Medical): No    Lack of Transportation (Non-Medical): No  Physical Activity: Insufficiently Active (04/06/2023)   Exercise Vital Sign    Days of Exercise per Week: 4 days    Minutes of Exercise per Session: 30 min  Stress: No Stress Concern Present (04/06/2023)    [employer] of Occupational Health - Occupational Stress Questionnaire    Feeling of Stress : Not at all  Social Connections: Moderately Integrated (04/06/2023)   Social Connection and Isolation Panel [NHANES]    Frequency of Communication with Friends and Family: Once a week    Frequency of Social Gatherings with Friends and Family: Twice a week    Attends Religious Services: 1 to 4 times per year    Active Member of [employer] or Organizations: Yes    Attends Banker Meetings: 1 to 4 times per year    Marital Status: Never married  Intimate Partner Violence: Not At Risk (04/10/2023)   Humiliation, Afraid, Rape, and Kick questionnaire    Fear of Current or Ex-Partner: No    Emotionally Abused: No    Physically Abused: No    Sexually Abused: No    Outpatient Medications Prior to Visit  Medication Sig Dispense Refill   carvedilol (COREG) 12.5 MG tablet Take 1 tablet (12.5 mg total) by mouth 2 (two) times daily with a meal. 180 tablet 1   ferrous sulfate 325 (65 FE) MG tablet Take 1 tablet (325 mg total) by mouth 2 (two) times daily with a meal. 60 tablet 2   pantoprazole (PROTONIX) 40 MG tablet Take 1 tablet (40 mg total) by mouth daily. 90 tablet 0   No facility-administered medications prior to visit.    Allergies  Allergen Reactions   Latex Rash    Latex gloves with powder cause a rash    ROS See HPI    Objective:    Physical Exam Constitutional:      General: She is not in acute distress.    Appearance: Normal appearance. She is well-developed.  HENT:     Head: Normocephalic and atraumatic.     Right Ear: External ear normal.     Left Ear: External ear normal.  Eyes:     General: No scleral icterus. Neck:     Thyroid: No thyromegaly.  Cardiovascular:     Rate and Rhythm: Normal rate and regular rhythm.     Heart sounds: Normal heart sounds. No murmur heard. Pulmonary:     Effort: Pulmonary effort is normal. No respiratory distress.     Breath  sounds: Normal breath sounds. No wheezing.  Musculoskeletal:     Cervical back: Neck supple.  Skin:    General: Skin is warm and dry.     Comments: Clean granulation tissue noted of LLE wounds. Some dry skin peeling noted on left foot.  Some foot swelling noted on left, but appears improved since last visit  Neurological:     Mental Status: She is alert and oriented to person, place, and time.  Psychiatric:  Mood and Affect: Mood normal.        Behavior: Behavior normal.        Thought Content: Thought content normal.        Judgment: Judgment normal.       BP (!) 178/75   Pulse 62   Temp 98.4 F (36.9 C) (Oral)   Resp 16   LMP 01/27/2017   SpO2 98%  Wt Readings from Last 3 Encounters:  05/08/23 (!) 303 lb (137.4 kg)  04/28/23 293 lb (132.9 kg)  04/21/23 294 lb (133.4 kg)       Assessment & Plan:   Problem List Items Addressed This Visit       Unprioritized   Multiple open wounds of lower extremity, left, sequela - Primary    Improving.  No sign of infection.  Continue daily dressing changes (wound was redressed today by myself).        HTN (hypertension)    Uncontrolled. Advised pt to add the PM dose of carvedilol. Will repeat BP in 1 week.  I did add furosemide 20 mg once daily prn for her LE edema.  Hopefully this will enable her to get her shoes back on so she can return to work on 05/23/23.       Relevant Medications   furosemide (LASIX) 20 MG tablet    I am having Pattricia Boss D. Snarski start on furosemide. I am also having her maintain her ferrous sulfate, carvedilol, and pantoprazole.  Meds ordered this encounter  Medications   furosemide (LASIX) 20 MG tablet    Sig: Take 1 tablet by mouth once daily as needed for swelling    Dispense:  30 tablet    Refill:  0    Order Specific Question:   Supervising Provider    Answer:   Danise Edge A [[id number]]

## 2023-05-15 NOTE — Assessment & Plan Note (Signed)
Improving.  No sign of infection.  Continue daily dressing changes (wound was redressed today by myself).

## 2023-05-15 NOTE — Patient Instructions (Signed)
VISIT SUMMARY:  During your visit, we discussed your foot wound and your high blood pressure. Your foot wound is improving, but it is not yet fully healed. Your skin is peeling due to swelling, which is preventing you from wearing shoes. We changed the dressing on your wound and discussed a plan for you to return to work. We also discussed your high blood pressure, which is unusually high despite your current medication.  YOUR PLAN:  LEG WOUNDS-The swelling is causing your skin to peel and is preventing you from wearing shoes. We've changed the dressing on your wound and started you on a medication to help reduce the swelling. Continue daily dressing changes.   -HIGH BLOOD PRESSURE: please increase your carvedilol to the recommended twice daily dosing schedule.  INSTRUCTIONS:  Continue with your wound care and take your blood pressure medication as prescribed. We will recheck your blood pressure in a week. If you are able to wear a shoe and your wound is not weeping, you can plan to return to work on August 6th, 2024.

## 2023-05-22 ENCOUNTER — Ambulatory Visit (INDEPENDENT_AMBULATORY_CARE_PROVIDER_SITE_OTHER): Payer: BC Managed Care – PPO | Admitting: Family

## 2023-05-22 VITALS — BP 158/52 | HR 56 | Temp 98.6°F | Resp 16

## 2023-05-22 DIAGNOSIS — S81802D Unspecified open wound, left lower leg, subsequent encounter: Secondary | ICD-10-CM

## 2023-05-22 DIAGNOSIS — S81802S Unspecified open wound, left lower leg, sequela: Secondary | ICD-10-CM

## 2023-05-22 DIAGNOSIS — I10 Essential (primary) hypertension: Secondary | ICD-10-CM | POA: Diagnosis not present

## 2023-05-22 LAB — BASIC METABOLIC PANEL
BUN: 11 mg/dL (ref 6–23)
CO2: 32 mEq/L (ref 19–32)
Calcium: 8.7 mg/dL (ref 8.4–10.5)
Chloride: 102 mEq/L (ref 96–112)
Creatinine, Ser: 0.94 mg/dL (ref 0.40–1.20)
GFR: 66.44 mL/min (ref >60.00)
Glucose, Bld: 92 mg/dL (ref 70–99)
Potassium: 3.9 mEq/L (ref 3.5–5.1)
Sodium: 142 mEq/L (ref 135–145)

## 2023-05-22 MED ORDER — VALSARTAN 160 MG PO TABS: 160.0000 mg | ORAL_TABLET | Freq: Every day | ORAL | 0 refills | Status: DC

## 2023-05-22 MED ORDER — CARVEDILOL 25 MG PO TABS: 25.0000 mg | ORAL_TABLET | Freq: Two times a day (BID) | ORAL | 1 refills | Status: DC

## 2023-05-22 NOTE — Patient Instructions (Signed)
VISIT SUMMARY:  During your visit, we discussed your high blood pressure, healing wound, and current medications. Despite taking carvedilol, your blood pressure remains high. Your wound is healing, but there is still some swelling. You've been taking furosemide, which has caused frequent urination.  YOUR PLAN:  -HIGH BLOOD PRESSURE: Your blood pressure is still high despite taking carvedilol. We will add a new medication called Valsartan to help lower your blood pressure. High blood pressure means the force of blood against your artery walls is too high, which can lead to heart disease.  -WOUND CARE: Your wounds are slowly healing.  We will continue with the current wound care regimen and you have an appointment at the wound care clinic this Thursday.   -MEDICATION MANAGEMENT: Once you finish your current supply of Carvedilol 12.5mg  tablets, we will switch you to Carvedilol 25mg  tablets twice daily  for convenience. This is to make it easier for you to take your medication.  -DISABILITY PAPERWORK: You have paperwork for short-term disability that needs my signature. I will sign this paperwork as you requested. Short-term disability is a type of insurance that provides some income replacement if you're unable to work for a short period due to illness or injury.  INSTRUCTIONS:  We will check your kidney function and potassium levels today. Please follow up in 1-2 weeks to monitor your blood pressure and kidney function. Continue with your current wound care regimen and attend your appointment at the wound care clinic this Thursday.

## 2023-05-22 NOTE — Assessment & Plan Note (Signed)
  Despite maximum dose of Carvedilol (25mg  twice daily), blood pressure remains elevated. -Add Valsartan, starting at a lower dose. -Check kidney function and potassium today. -Follow-up in 1-2 weeks to monitor blood pressure and kidney function.

## 2023-05-22 NOTE — Progress Notes (Signed)
Subjective:     Patient ID: Marissa Bowman, female    DOB: February 03, 1966, 57 y.o.   MRN: [id number]  Chief Complaint  Patient presents with   Hypertension    Here for follow up    HPI  Discussed the use of AI scribe software for clinical note transcription with the patient, who gave verbal consent to proceed.  History of Present Illness   The patient, with a history of hypertension and a healing wound, presents for a follow-up visit. The wound, although healing, is still swollen. The patient has been taking carvedilol for hypertension, but the blood pressure remains high despite being on the maximum dose. The patient has also been taking furosemide, a diuretic, but reports frequent urination but no improvement in her LE edema.  She scheduled herself a visit at the [facility] wound care center for tomorrow.          Health Maintenance Due  Topic Date Due   INFLUENZA VACCINE  05/18/2023    Past Medical History:  Diagnosis Date   Anemia    on iron   Asthma    childhood   Hypertension     Past Surgical History:  Procedure Laterality Date   BIOPSY  10/12/2021   Procedure: BIOPSY;  Surgeon: Napoleon Form, MD;  Location: [facility];  Service: Endoscopy;;   COLONOSCOPY     COLONOSCOPY WITH PROPOFOL N/A 02/03/2017   Procedure: COLONOSCOPY WITH PROPOFOL;  Surgeon: Napoleon Form, MD;  Location: [facility];  Service: Endoscopy;  Laterality: N/A;   COLONOSCOPY WITH PROPOFOL N/A 10/12/2021   Procedure: COLONOSCOPY WITH PROPOFOL;  Surgeon: Napoleon Form, MD;  Location: [facility];  Service: Endoscopy;  Laterality: N/A;   ESOPHAGOGASTRODUODENOSCOPY N/A 04/12/2023   Procedure: ESOPHAGOGASTRODUODENOSCOPY (EGD);  Surgeon: Hilarie Fredrickson, MD;  Location: [facility];  Service: Gastroenterology;  Laterality: N/A;   ESOPHAGOGASTRODUODENOSCOPY (EGD) WITH PROPOFOL N/A 10/12/2021   Procedure: ESOPHAGOGASTRODUODENOSCOPY (EGD) WITH PROPOFOL;  Surgeon: Napoleon Form, MD;   Location: [facility];  Service: Endoscopy;  Laterality: N/A;   NO PAST SURGERIES     POLYPECTOMY  10/12/2021   Procedure: POLYPECTOMY;  Surgeon: Napoleon Form, MD;  Location: [facility];  Service: Endoscopy;;    Family History  Problem Relation Age of Onset   Hypertension Mother    Hyperlipidemia Mother    Diabetes Mother    Lung cancer Father    Thyroid disease Sister    Fibromyalgia Sister    Colon cancer Neg Hx     Social History   Socioeconomic History   Marital status: Single    Spouse name: Not on file   Number of children: Not on file   Years of education: Not on file   Highest education level: Associate degree: occupational, Scientist, product/process development, or vocational program  Occupational History   Not on file  Tobacco Use   Smoking status: Never   Smokeless tobacco: Never  Vaping Use   Vaping status: Never Used  Substance and Sexual Activity   Alcohol use: No   Drug use: No   Sexual activity: Not on file  Other Topics Concern   Not on file  Social History Narrative   1 son- age 60   Unemployed, plans to sub with the [employer]   Associated degree   Lives with mother father/father and son   Has 1 dog lab/spaniel mix   Enjoys reading   Social Determinants of Health   Financial Resource Strain: Low Risk  (04/06/2023)  Overall Financial Resource Strain (CARDIA)    Difficulty of Paying Living Expenses: Not very hard  Food Insecurity: Food Insecurity Present (04/10/2023)   Hunger Vital Sign    Worried About Running Out of Food in the Last Year: Sometimes true    Ran Out of Food in the Last Year: Sometimes true  Transportation Needs: No Transportation Needs (04/10/2023)   PRAPARE - Administrator, Civil Service (Medical): No    Lack of Transportation (Non-Medical): No  Physical Activity: Insufficiently Active (04/06/2023)   Exercise Vital Sign    Days of Exercise per Week: 4 days    Minutes of Exercise per Session: 30 min  Stress: No Stress Concern  Present (04/06/2023)   [employer] of Occupational Health - Occupational Stress Questionnaire    Feeling of Stress : Not at all  Social Connections: Moderately Integrated (04/06/2023)   Social Connection and Isolation Panel [NHANES]    Frequency of Communication with Friends and Family: Once a week    Frequency of Social Gatherings with Friends and Family: Twice a week    Attends Religious Services: 1 to 4 times per year    Active Member of [employer] or Organizations: Yes    Attends Banker Meetings: 1 to 4 times per year    Marital Status: Never married  Intimate Partner Violence: Not At Risk (04/10/2023)   Humiliation, Afraid, Rape, and Kick questionnaire    Fear of Current or Ex-Partner: No    Emotionally Abused: No    Physically Abused: No    Sexually Abused: No    Outpatient Medications Prior to Visit  Medication Sig Dispense Refill   furosemide (LASIX) 20 MG tablet Take 1 tablet by mouth once daily as needed for swelling 30 tablet 0   pantoprazole (PROTONIX) 40 MG tablet Take 1 tablet (40 mg total) by mouth daily. 90 tablet 0   carvedilol (COREG) 12.5 MG tablet Take 1 tablet (12.5 mg total) by mouth 2 (two) times daily with a meal. 180 tablet 1   ferrous sulfate 325 (65 FE) MG tablet Take 1 tablet (325 mg total) by mouth 2 (two) times daily with a meal. 60 tablet 2   No facility-administered medications prior to visit.    Allergies  Allergen Reactions   Latex Rash    Latex gloves with powder cause a rash    ROS     Objective:    Physical Exam Constitutional:      General: She is not in acute distress.    Appearance: Normal appearance. She is well-developed.  HENT:     Head: Normocephalic and atraumatic.     Right Ear: External ear normal.     Left Ear: External ear normal.  Eyes:     General: No scleral icterus. Neck:     Thyroid: No thyromegaly.  Cardiovascular:     Rate and Rhythm: Normal rate and regular rhythm.     Heart sounds: Normal heart  sounds. No murmur heard. Pulmonary:     Effort: Pulmonary effort is normal. No respiratory distress.     Breath sounds: Normal breath sounds. No wheezing.  Musculoskeletal:     Cervical back: Neck supple.  Skin:    General: Skin is warm and dry.     Comments: LLE skin ulcers slightly improved today.    Neurological:     Mental Status: She is alert and oriented to person, place, and time.  Psychiatric:        Mood  and Affect: Mood normal.        Behavior: Behavior normal.        Thought Content: Thought content normal.        Judgment: Judgment normal.       BP (!) 158/52   Pulse (!) 56   Temp 98.6 F (37 C) (Oral)   Resp 16   LMP 01/27/2017   SpO2 100%  Wt Readings from Last 3 Encounters:  05/08/23 (!) 303 lb (137.4 kg)  04/28/23 293 lb (132.9 kg)  04/21/23 294 lb (133.4 kg)       Assessment & Plan:   Problem List Items Addressed This Visit       Unprioritized   Multiple open wounds of lower extremity, left, sequela     Healing wound with some residual swelling. Patient has been using Furosemide with limited effect on swelling but causing frequent urination. -Continue current wound care regimen. -Refer to wound care clinic (appointment already scheduled by patient for Thursday).       HTN (hypertension) - Primary     Despite maximum dose of Carvedilol (25mg  twice daily), blood pressure remains elevated. -Add Valsartan, starting at a lower dose. -Check kidney function and potassium today. -Follow-up in 1-2 weeks to monitor blood pressure and kidney function.      Relevant Medications   valsartan (DIOVAN) 160 MG tablet   carvedilol (COREG) 25 MG tablet   Other Relevant Orders   Basic Metabolic Panel (BMET) (Completed)    I have discontinued Kennith Gain. Okuda's carvedilol. I am also having her start on valsartan and carvedilol. Additionally, I am having her maintain her ferrous sulfate, pantoprazole, and furosemide.  Meds ordered this encounter   Medications   valsartan (DIOVAN) 160 MG tablet    Sig: Take 1 tablet (160 mg total) by mouth daily.    Dispense:  90 tablet    Refill:  0    Order Specific Question:   Supervising Provider    Answer:   Danise Edge A [[id number]]   carvedilol (COREG) 25 MG tablet    Sig: Take 1 tablet (25 mg total) by mouth 2 (two) times daily with a meal.    Dispense:  180 tablet    Refill:  1    Order Specific Question:   Supervising Provider    Answer:   Danise Edge A [[id number]]

## 2023-05-22 NOTE — Assessment & Plan Note (Addendum)
  Healing wound with some residual swelling. Patient has been using Furosemide with limited effect on swelling but causing frequent urination. -Continue current wound care regimen. -Refer to wound care clinic (appointment already scheduled by patient for Thursday). -wound was redressed today

## 2023-05-25 ENCOUNTER — Telehealth: Payer: Self-pay | Admitting: Gastroenterology

## 2023-05-25 NOTE — Telephone Encounter (Signed)
Patient has a referral for acute gastri erosion. Scheduled for ov 12/9. Please review referral and advise if patient is needing to be seen sooner.   Thank you

## 2023-05-29 NOTE — Telephone Encounter (Signed)
Called the patient to offer an appointment 06/05/23. No answer. Did not leave a message.

## 2023-05-30 NOTE — Telephone Encounter (Signed)
Spoke with the patient. Offered an appointment with APP. Declines. Wants to see Dr Lavon Paganini.

## 2023-06-05 ENCOUNTER — Ambulatory Visit (INDEPENDENT_AMBULATORY_CARE_PROVIDER_SITE_OTHER): Payer: BC Managed Care – PPO | Admitting: Family

## 2023-06-05 VITALS — BP 152/75 | HR 61 | Temp 98.3°F | Resp 16 | Wt 298.0 lb

## 2023-06-05 DIAGNOSIS — K921 Melena: Secondary | ICD-10-CM

## 2023-06-05 DIAGNOSIS — I10 Essential (primary) hypertension: Secondary | ICD-10-CM | POA: Diagnosis not present

## 2023-06-05 DIAGNOSIS — S81802A Unspecified open wound, left lower leg, initial encounter: Secondary | ICD-10-CM | POA: Insufficient documentation

## 2023-06-05 DIAGNOSIS — S81802S Unspecified open wound, left lower leg, sequela: Secondary | ICD-10-CM

## 2023-06-05 DIAGNOSIS — E538 Deficiency of other specified B group vitamins: Secondary | ICD-10-CM | POA: Diagnosis not present

## 2023-06-05 DIAGNOSIS — D509 Iron deficiency anemia, unspecified: Secondary | ICD-10-CM

## 2023-06-05 MED ORDER — VALSARTAN 320 MG PO TABS
320.0000 mg | ORAL_TABLET | Freq: Every day | ORAL | 0 refills | Status: DC
Start: 2023-06-05 — End: 2023-08-29

## 2023-06-05 NOTE — Assessment & Plan Note (Signed)
Uncontrolled. Increase valsartan from 160 mg to 320 mg.  Follow up in 2 weeks. Update BMET.

## 2023-06-05 NOTE — Assessment & Plan Note (Signed)
She seems to have gotten off track with her b12 injections.  Plan to repeat b12 level next visit.

## 2023-06-05 NOTE — Patient Instructions (Signed)
VISIT SUMMARY:  During your visit, we discussed your healing foot wound and your hypertension. You reported that your foot wound is improving, which is great news. We also discussed your blood pressure, which remains a bit high despite your current medication regimen.  YOUR PLAN:  -Leg WOUNDs: Your foot wound is healing and improving. We will continue with the current wound care regimen.  -HYPERTENSION: Hypertension is a condition where the force of the blood against the artery walls is too high. Despite your current medication, your blood pressure is still a bit high. We will increase the dose of your Valsartan medication from 160mg  to 320mg  daily. Also, we need to confirm that you are taking Carvedilol 25mg  twice daily.  INSTRUCTIONS:  Please check your blood work today and again after the medication adjustment to monitor your potassium levels. This is important to ensure that your body is responding well to the medication changes. Also, please schedule a follow-up appointment in 1-2 weeks so we can continue to monitor your progress.

## 2023-06-05 NOTE — Assessment & Plan Note (Signed)
Was felt due to erosive gastritis on EGD back in June. She is maintained on pantoprazole and iron.

## 2023-06-05 NOTE — Progress Notes (Signed)
Subjective:     Patient ID: Marissa Bowman, female    DOB: [birth year], 57 y.o.   MRN: [id number]  Chief Complaint  Patient presents with   Hypertension    Here for follow up    HPI  Discussed the use of AI scribe software for clinical note transcription with the patient, who gave verbal consent to proceed.  History of Present Illness   The patient presents for follow-up of her blood pressure.   LE wounds. She reports that the wounds are improving and are 'drying up.' She is scheduled for further wound care at the wound care clinic tomorrow AM.   In addition, the patient is being treated for hypertension. She is currently taking valsartan 160 mg  once daily in the morning (this was started last visit) and carvedilol twice daily. She reports adherence to this regimen. The patient is also making efforts to lose weight.          Health Maintenance Due  Topic Date Due   INFLUENZA VACCINE  05/18/2023    Past Medical History:  Diagnosis Date   Anemia    on iron   Asthma    childhood   Hypertension     Past Surgical History:  Procedure Laterality Date   BIOPSY  10/12/2021   Procedure: BIOPSY;  Surgeon: Napoleon Form, MD;  Location: [facility];  Service: Endoscopy;;   COLONOSCOPY     COLONOSCOPY WITH PROPOFOL N/A 02/03/2017   Procedure: COLONOSCOPY WITH PROPOFOL;  Surgeon: Napoleon Form, MD;  Location: [facility];  Service: Endoscopy;  Laterality: N/A;   COLONOSCOPY WITH PROPOFOL N/A 10/12/2021   Procedure: COLONOSCOPY WITH PROPOFOL;  Surgeon: Napoleon Form, MD;  Location: [facility];  Service: Endoscopy;  Laterality: N/A;   ESOPHAGOGASTRODUODENOSCOPY N/A 04/12/2023   Procedure: ESOPHAGOGASTRODUODENOSCOPY (EGD);  Surgeon: Hilarie Fredrickson, MD;  Location: [facility];  Service: Gastroenterology;  Laterality: N/A;   ESOPHAGOGASTRODUODENOSCOPY (EGD) WITH PROPOFOL N/A 10/12/2021   Procedure: ESOPHAGOGASTRODUODENOSCOPY (EGD) WITH PROPOFOL;  Surgeon:  Napoleon Form, MD;  Location: [facility];  Service: Endoscopy;  Laterality: N/A;   NO PAST SURGERIES     POLYPECTOMY  10/12/2021   Procedure: POLYPECTOMY;  Surgeon: Napoleon Form, MD;  Location: [facility];  Service: Endoscopy;;    Family History  Problem Relation Age of Onset   Hypertension Mother    Hyperlipidemia Mother    Diabetes Mother    Lung cancer Father    Thyroid disease Sister    Fibromyalgia Sister    Colon cancer Neg Hx     Social History   Socioeconomic History   Marital status: Single    Spouse name: Not on file   Number of children: Not on file   Years of education: Not on file   Highest education level: Associate degree: occupational, Scientist, product/process development, or vocational program  Occupational History   Not on file  Tobacco Use   Smoking status: Never   Smokeless tobacco: Never  Vaping Use   Vaping status: Never Used  Substance and Sexual Activity   Alcohol use: No   Drug use: No   Sexual activity: Not on file  Other Topics Concern   Not on file  Social History Narrative   1 son- age 35   Unemployed, plans to sub with the [employer]   Associated degree   Lives with mother father/father and son   Has 1 dog lab/spaniel mix   Enjoys reading   Social Determinants of Health  Financial Resource Strain: Low Risk  (04/06/2023)   Overall Financial Resource Strain (CARDIA)    Difficulty of Paying Living Expenses: Not very hard  Food Insecurity: Food Insecurity Present (04/10/2023)   Hunger Vital Sign    Worried About Running Out of Food in the Last Year: Sometimes true    Ran Out of Food in the Last Year: Sometimes true  Transportation Needs: No Transportation Needs (04/10/2023)   PRAPARE - Administrator, Civil Service (Medical): No    Lack of Transportation (Non-Medical): No  Physical Activity: Insufficiently Active (04/06/2023)   Exercise Vital Sign    Days of Exercise per Week: 4 days    Minutes of Exercise per Session: 30 min   Stress: No Stress Concern Present (04/06/2023)   [employer] of Occupational Health - Occupational Stress Questionnaire    Feeling of Stress : Not at all  Social Connections: Moderately Integrated (04/06/2023)   Social Connection and Isolation Panel [NHANES]    Frequency of Communication with Friends and Family: Once a week    Frequency of Social Gatherings with Friends and Family: Twice a week    Attends Religious Services: 1 to 4 times per year    Active Member of [employer] or Organizations: Yes    Attends Banker Meetings: 1 to 4 times per year    Marital Status: Never married  Intimate Partner Violence: Not At Risk (04/10/2023)   Humiliation, Afraid, Rape, and Kick questionnaire    Fear of Current or Ex-Partner: No    Emotionally Abused: No    Physically Abused: No    Sexually Abused: No    Outpatient Medications Prior to Visit  Medication Sig Dispense Refill   carvedilol (COREG) 25 MG tablet Take 1 tablet (25 mg total) by mouth 2 (two) times daily with a meal. 180 tablet 1   furosemide (LASIX) 20 MG tablet Take 1 tablet by mouth once daily as needed for swelling 30 tablet 0   pantoprazole (PROTONIX) 40 MG tablet Take 1 tablet (40 mg total) by mouth daily. 90 tablet 0   valsartan (DIOVAN) 160 MG tablet Take 1 tablet (160 mg total) by mouth daily. 90 tablet 0   ferrous sulfate 325 (65 FE) MG tablet Take 1 tablet (325 mg total) by mouth 2 (two) times daily with a meal. 60 tablet 2   No facility-administered medications prior to visit.    Allergies  Allergen Reactions   Latex Rash    Latex gloves with powder cause a rash    ROS     Objective:    Physical Exam Constitutional:      General: She is not in acute distress.    Appearance: Normal appearance. She is well-developed.  HENT:     Head: Normocephalic and atraumatic.     Right Ear: External ear normal.     Left Ear: External ear normal.  Eyes:     General: No scleral icterus. Neck:     Thyroid: No  thyromegaly.  Cardiovascular:     Rate and Rhythm: Normal rate and regular rhythm.     Heart sounds: Normal heart sounds. No murmur heard. Pulmonary:     Effort: Pulmonary effort is normal. No respiratory distress.     Breath sounds: Normal breath sounds. No wheezing.  Musculoskeletal:     Cervical back: Neck supple.  Skin:    General: Skin is warm and dry.     Comments: LLE wounds are currently dressed. Not examined.  Neurological:     Mental Status: She is alert and oriented to person, place, and time.  Psychiatric:        Mood and Affect: Mood normal.        Behavior: Behavior normal.        Thought Content: Thought content normal.        Judgment: Judgment normal.      BP (!) 152/75   Pulse 61   Temp 98.3 F (36.8 C) (Oral)   Resp 16   Wt 298 lb (135.2 kg)   LMP 01/27/2017   SpO2 100%   BMI 51.15 kg/m  Wt Readings from Last 3 Encounters:  06/05/23 298 lb (135.2 kg)  05/08/23 (!) 303 lb (137.4 kg)  04/28/23 293 lb (132.9 kg)       Assessment & Plan:   Problem List Items Addressed This Visit       Unprioritized   Multiple open wounds of lower extremity, left, sequela    Photo she showed me from the other day looked much improved since the last time that I saw her.       Melena    Was felt due to erosive gastritis on EGD back in June. She is maintained on pantoprazole and iron.       Leg wound, left   HTN (hypertension) - Primary    Uncontrolled. Increase valsartan from 160 mg to 320 mg.  Follow up in 2 weeks. Update BMET.       Relevant Medications   valsartan (DIOVAN) 320 MG tablet   Other Relevant Orders   Basic Metabolic Panel (BMET)   B12 deficiency    She seems to have gotten off track with her b12 injections.  Plan to repeat b12 level next visit.      Anemia, iron deficiency    Lab Results  Component Value Date   WBC 6.5 04/28/2023   HGB 9.5 (L) 04/28/2023   HCT 30.1 (L) 04/28/2023   MCV 82.5 04/28/2023   PLT 275 04/28/2023    Consider updating cbc next visit.        I have discontinued Kennith Gain. Artz's valsartan. I am also having her start on valsartan. Additionally, I am having her maintain her ferrous sulfate, pantoprazole, furosemide, and carvedilol.  Meds ordered this encounter  Medications   valsartan (DIOVAN) 320 MG tablet    Sig: Take 1 tablet (320 mg total) by mouth daily.    Dispense:  90 tablet    Refill:  0    Order Specific Question:   Supervising Provider    Answer:   Danise Edge A [[id number]]

## 2023-06-05 NOTE — Assessment & Plan Note (Signed)
Lab Results  Component Value Date   WBC 6.5 04/28/2023   HGB 9.5 (L) 04/28/2023   HCT 30.1 (L) 04/28/2023   MCV 82.5 04/28/2023   PLT 275 04/28/2023   Consider updating cbc next visit.

## 2023-06-05 NOTE — Assessment & Plan Note (Signed)
Photo she showed me from the other day looked much improved since the last time that I saw her.

## 2023-06-06 LAB — BASIC METABOLIC PANEL
BUN: 10 mg/dL (ref 6–23)
CO2: 30 meq/L (ref 19–32)
Calcium: 8.5 mg/dL (ref 8.4–10.5)
Chloride: 101 meq/L (ref 96–112)
Creatinine, Ser: 0.83 mg/dL (ref 0.40–1.20)
GFR: 77.12 mL/min (ref >60.00)
Glucose, Bld: 95 mg/dL (ref 70–99)
Potassium: 3.6 meq/L (ref 3.5–5.1)
Sodium: 142 mEq/L (ref 135–145)

## 2023-06-20 ENCOUNTER — Ambulatory Visit (INDEPENDENT_AMBULATORY_CARE_PROVIDER_SITE_OTHER): Payer: BC Managed Care – PPO | Admitting: Family

## 2023-06-20 VITALS — BP 174/80 | HR 52 | Temp 98.2°F | Resp 16 | Wt 297.0 lb

## 2023-06-20 DIAGNOSIS — I10 Essential (primary) hypertension: Secondary | ICD-10-CM

## 2023-06-20 DIAGNOSIS — Z6841 Body Mass Index (BMI) 40.0 and over, adult: Secondary | ICD-10-CM | POA: Diagnosis not present

## 2023-06-20 DIAGNOSIS — S81802A Unspecified open wound, left lower leg, initial encounter: Secondary | ICD-10-CM

## 2023-06-20 LAB — BASIC METABOLIC PANEL
BUN: 15 mg/dL (ref 6–23)
CO2: 29 meq/L (ref 19–32)
Calcium: 9 mg/dL (ref 8.4–10.5)
Chloride: 102 meq/L (ref 96–112)
Creatinine, Ser: 0.89 mg/dL (ref 0.40–1.20)
GFR: 70.9 mL/min (ref >60.00)
Glucose, Bld: 83 mg/dL (ref 70–99)
Potassium: 3.6 meq/L (ref 3.5–5.1)
Sodium: 141 meq/L (ref 135–145)

## 2023-06-20 MED ORDER — CLONIDINE HCL 0.1 MG PO TABS: 0.1000 mg | ORAL_TABLET | Freq: Two times a day (BID) | ORAL | 3 refills | Status: DC

## 2023-06-20 NOTE — Progress Notes (Deleted)
a 

## 2023-06-20 NOTE — Progress Notes (Signed)
Subjective:     Patient ID: Marissa Bowman, female    DOB: [birth year], 57 y.o.   MRN: [id number]  Chief Complaint  Patient presents with   Hypertension    Here for follow up    Hypertension Pertinent negatives include no chest pain, headaches, malaise/fatigue, neck pain, palpitations or shortness of breath.    Discussed the use of AI scribe software for clinical note transcription with the patient, who gave verbal consent to proceed.   57 year old female comes in today for 2 week follow up for blood pressure. During last visit she was increased to Valsartan 320mg  to see if this helps with her elevated blood pressure readings. Patients pressure is still elevated at this time. She denies any changes with her diet or exercise given her leg wound. She states that she goes and sees wound care center today for her leg. Patient states that she is taking medication daily without skipping doses. Blood pressure on recheck was 170/84. Denies any changes with medications. Denies headaches or palpitations at this time.      Health Maintenance Due  Topic Date Due   INFLUENZA VACCINE  05/18/2023    Past Medical History:  Diagnosis Date   Anemia    on iron   Asthma    childhood   Hypertension     Past Surgical History:  Procedure Laterality Date   BIOPSY  10/12/2021   Procedure: BIOPSY;  Surgeon: Napoleon Form, MD;  Location: [facility];  Service: Endoscopy;;   COLONOSCOPY     COLONOSCOPY WITH PROPOFOL N/A 02/03/2017   Procedure: COLONOSCOPY WITH PROPOFOL;  Surgeon: Napoleon Form, MD;  Location: [facility];  Service: Endoscopy;  Laterality: N/A;   COLONOSCOPY WITH PROPOFOL N/A 10/12/2021   Procedure: COLONOSCOPY WITH PROPOFOL;  Surgeon: Napoleon Form, MD;  Location: [facility];  Service: Endoscopy;  Laterality: N/A;   ESOPHAGOGASTRODUODENOSCOPY N/A 04/12/2023   Procedure: ESOPHAGOGASTRODUODENOSCOPY (EGD);  Surgeon: Hilarie Fredrickson, MD;  Location: [facility];   Service: Gastroenterology;  Laterality: N/A;   ESOPHAGOGASTRODUODENOSCOPY (EGD) WITH PROPOFOL N/A 10/12/2021   Procedure: ESOPHAGOGASTRODUODENOSCOPY (EGD) WITH PROPOFOL;  Surgeon: Napoleon Form, MD;  Location: [facility];  Service: Endoscopy;  Laterality: N/A;   NO PAST SURGERIES     POLYPECTOMY  10/12/2021   Procedure: POLYPECTOMY;  Surgeon: Napoleon Form, MD;  Location: [facility];  Service: Endoscopy;;    Family History  Problem Relation Age of Onset   Hypertension Mother    Hyperlipidemia Mother    Diabetes Mother    Lung cancer Father    Thyroid disease Sister    Fibromyalgia Sister    Colon cancer Neg Hx     Social History   Socioeconomic History   Marital status: Single    Spouse name: Not on file   Number of children: Not on file   Years of education: Not on file   Highest education level: Associate degree: occupational, Scientist, product/process development, or vocational program  Occupational History   Not on file  Tobacco Use   Smoking status: Never   Smokeless tobacco: Never  Vaping Use   Vaping status: Never Used  Substance and Sexual Activity   Alcohol use: No   Drug use: No   Sexual activity: Not on file  Other Topics Concern   Not on file  Social History Narrative   1 son- age 23   Unemployed, plans to sub with the [employer]   Associated degree   Lives with  mother father/father and son   Has 1 dog lab/spaniel mix   Enjoys reading   Social Determinants of Health   Financial Resource Strain: Low Risk  (04/06/2023)   Overall Financial Resource Strain (CARDIA)    Difficulty of Paying Living Expenses: Not very hard  Food Insecurity: Food Insecurity Present (04/10/2023)   Hunger Vital Sign    Worried About Running Out of Food in the Last Year: Sometimes true    Ran Out of Food in the Last Year: Sometimes true  Transportation Needs: No Transportation Needs (04/10/2023)   PRAPARE - Administrator, Civil Service (Medical): No    Lack of Transportation  (Non-Medical): No  Physical Activity: Insufficiently Active (04/06/2023)   Exercise Vital Sign    Days of Exercise per Week: 4 days    Minutes of Exercise per Session: 30 min  Stress: No Stress Concern Present (04/06/2023)   [employer] of Occupational Health - Occupational Stress Questionnaire    Feeling of Stress : Not at all  Social Connections: Moderately Integrated (04/06/2023)   Social Connection and Isolation Panel [NHANES]    Frequency of Communication with Friends and Family: Once a week    Frequency of Social Gatherings with Friends and Family: Twice a week    Attends Religious Services: 1 to 4 times per year    Active Member of [employer] or Organizations: Yes    Attends Banker Meetings: 1 to 4 times per year    Marital Status: Never married  Intimate Partner Violence: Not At Risk (04/10/2023)   Humiliation, Afraid, Rape, and Kick questionnaire    Fear of Current or Ex-Partner: No    Emotionally Abused: No    Physically Abused: No    Sexually Abused: No    Outpatient Medications Prior to Visit  Medication Sig Dispense Refill   carvedilol (COREG) 25 MG tablet Take 1 tablet (25 mg total) by mouth 2 (two) times daily with a meal. 180 tablet 1   furosemide (LASIX) 20 MG tablet Take 1 tablet by mouth once daily as needed for swelling 30 tablet 0   pantoprazole (PROTONIX) 40 MG tablet Take 1 tablet (40 mg total) by mouth daily. 90 tablet 0   valsartan (DIOVAN) 320 MG tablet Take 1 tablet (320 mg total) by mouth daily. 90 tablet 0   ferrous sulfate 325 (65 FE) MG tablet Take 1 tablet (325 mg total) by mouth 2 (two) times daily with a meal. 60 tablet 2   No facility-administered medications prior to visit.    Allergies  Allergen Reactions   Latex Rash    Latex gloves with powder cause a rash    Review of Systems  Constitutional:  Negative for malaise/fatigue and weight loss.  HENT:  Negative for ear pain, hearing loss and sinus pain.   Respiratory:  Negative  for cough and shortness of breath.   Cardiovascular:  Negative for chest pain and palpitations.  Gastrointestinal:  Negative for diarrhea, nausea and vomiting.  Musculoskeletal:  Negative for back pain and neck pain.  Neurological:  Negative for tingling, speech change and headaches.  Psychiatric/Behavioral:  Negative for depression and suicidal ideas.        Objective:    Physical Exam Constitutional:      Appearance: Normal appearance.  HENT:     Head: Normocephalic.     Nose: Nose normal.     Mouth/Throat:     Mouth: Mucous membranes are moist.  Cardiovascular:  Rate and Rhythm: Normal rate and regular rhythm.  Musculoskeletal:     Cervical back: Normal range of motion.  Skin:    General: Skin is warm.     Capillary Refill: Capillary refill takes less than 2 seconds.  Neurological:     General: No focal deficit present.     Mental Status: She is alert and oriented to person, place, and time. Mental status is at baseline.  Psychiatric:        Mood and Affect: Mood normal.        Behavior: Behavior normal.        Thought Content: Thought content normal.        Judgment: Judgment normal.      BP (!) 178/64 (BP Location: Right Arm, Patient Position: Sitting, Cuff Size: Large)   Pulse (!) 52   Temp 98.2 F (36.8 C) (Oral)   Resp 16   Wt 297 lb (134.7 kg)   LMP 01/27/2017   BMI 50.98 kg/m  Wt Readings from Last 3 Encounters:  06/20/23 297 lb (134.7 kg)  06/05/23 298 lb (135.2 kg)  05/08/23 (!) 303 lb (137.4 kg)       Assessment & Plan:   Problem List Items Addressed This Visit   None   I am having Marissa Bowman maintain her ferrous sulfate, pantoprazole, furosemide, carvedilol, and valsartan.  No orders of the defined types were placed in this encounter.

## 2023-06-20 NOTE — Addendum Note (Signed)
Addended by: Sandford Craze on: 06/20/2023 11:54 AM   Modules accepted: Orders

## 2023-06-20 NOTE — Assessment & Plan Note (Signed)
Improving, has follow up with the wound care clinic today.

## 2023-06-20 NOTE — Assessment & Plan Note (Signed)
Pt misunderstood our instructions and has been taking diovan 325mg  bid.  Advised pt to bring down to once daily. Continue Carvedilol 25mg  twice daily, add clonidine bid. .   Order renal artery ultrasound to rule out renal artery stenosis as a secondary cause of hypertension. -Refer to Hypertension Clinic for further management. -Recheck blood pressure in two weeks, unless seen at Hypertension Clinic prior. -Recheck renal function today.

## 2023-06-20 NOTE — Progress Notes (Signed)
Subjective:     Patient ID: Marissa Bowman, female    DOB: Dec 14, 1965, 57 y.o.   MRN: [id number]  Chief Complaint  Patient presents with   Hypertension    Here for follow up    Hypertension    Discussed the use of AI scribe software for clinical note transcription with the patient, who gave verbal consent to proceed.  History of Present Illness         The patient, with a history of hypertension, presents for a follow-up visit. Despite being on Diovan 320 mg once daily and Carvedilol 25 mg twice daily, her blood pressure remains high. She reports that her leg wounds are healing well. She had previously been on Amlodipine, but it was discontinued at the request of her dentist due to gum hypertrophy.      Health Maintenance Due  Topic Date Due   INFLUENZA VACCINE  05/18/2023    Past Medical History:  Diagnosis Date   Anemia    on iron   Asthma    childhood   Hypertension     Past Surgical History:  Procedure Laterality Date   BIOPSY  10/12/2021   Procedure: BIOPSY;  Surgeon: Napoleon Form, MD;  Location: [facility];  Service: Endoscopy;;   COLONOSCOPY     COLONOSCOPY WITH PROPOFOL N/A 02/03/2017   Procedure: COLONOSCOPY WITH PROPOFOL;  Surgeon: Napoleon Form, MD;  Location: [facility];  Service: Endoscopy;  Laterality: N/A;   COLONOSCOPY WITH PROPOFOL N/A 10/12/2021   Procedure: COLONOSCOPY WITH PROPOFOL;  Surgeon: Napoleon Form, MD;  Location: [facility];  Service: Endoscopy;  Laterality: N/A;   ESOPHAGOGASTRODUODENOSCOPY N/A 04/12/2023   Procedure: ESOPHAGOGASTRODUODENOSCOPY (EGD);  Surgeon: Hilarie Fredrickson, MD;  Location: [facility];  Service: Gastroenterology;  Laterality: N/A;   ESOPHAGOGASTRODUODENOSCOPY (EGD) WITH PROPOFOL N/A 10/12/2021   Procedure: ESOPHAGOGASTRODUODENOSCOPY (EGD) WITH PROPOFOL;  Surgeon: Napoleon Form, MD;  Location: [facility];  Service: Endoscopy;  Laterality: N/A;   NO PAST SURGERIES     POLYPECTOMY   10/12/2021   Procedure: POLYPECTOMY;  Surgeon: Napoleon Form, MD;  Location: [facility];  Service: Endoscopy;;    Family History  Problem Relation Age of Onset   Hypertension Mother    Hyperlipidemia Mother    Diabetes Mother    Lung cancer Father    Thyroid disease Sister    Fibromyalgia Sister    Colon cancer Neg Hx     Social History   Socioeconomic History   Marital status: Single    Spouse name: Not on file   Number of children: Not on file   Years of education: Not on file   Highest education level: Associate degree: occupational, Scientist, product/process development, or vocational program  Occupational History   Not on file  Tobacco Use   Smoking status: Never   Smokeless tobacco: Never  Vaping Use   Vaping status: Never Used  Substance and Sexual Activity   Alcohol use: No   Drug use: No   Sexual activity: Not on file  Other Topics Concern   Not on file  Social History Narrative   1 son- age 53   Unemployed, plans to sub with the [employer]   Associated degree   Lives with mother father/father and son   Has 1 dog lab/spaniel mix   Enjoys reading   Social Determinants of Health   Financial Resource Strain: Low Risk  (04/06/2023)   Overall Financial Resource Strain (CARDIA)    Difficulty of Paying  Living Expenses: Not very hard  Food Insecurity: Food Insecurity Present (04/10/2023)   Hunger Vital Sign    Worried About Running Out of Food in the Last Year: Sometimes true    Ran Out of Food in the Last Year: Sometimes true  Transportation Needs: No Transportation Needs (04/10/2023)   PRAPARE - Administrator, Civil Service (Medical): No    Lack of Transportation (Non-Medical): No  Physical Activity: Insufficiently Active (04/06/2023)   Exercise Vital Sign    Days of Exercise per Week: 4 days    Minutes of Exercise per Session: 30 min  Stress: No Stress Concern Present (04/06/2023)   [employer] of Occupational Health - Occupational Stress Questionnaire     Feeling of Stress : Not at all  Social Connections: Moderately Integrated (04/06/2023)   Social Connection and Isolation Panel [NHANES]    Frequency of Communication with Friends and Family: Once a week    Frequency of Social Gatherings with Friends and Family: Twice a week    Attends Religious Services: 1 to 4 times per year    Active Member of [employer] or Organizations: Yes    Attends Banker Meetings: 1 to 4 times per year    Marital Status: Never married  Intimate Partner Violence: Not At Risk (04/10/2023)   Humiliation, Afraid, Rape, and Kick questionnaire    Fear of Current or Ex-Partner: No    Emotionally Abused: No    Physically Abused: No    Sexually Abused: No    Outpatient Medications Prior to Visit  Medication Sig Dispense Refill   carvedilol (COREG) 25 MG tablet Take 1 tablet (25 mg total) by mouth 2 (two) times daily with a meal. 180 tablet 1   furosemide (LASIX) 20 MG tablet Take 1 tablet by mouth once daily as needed for swelling 30 tablet 0   pantoprazole (PROTONIX) 40 MG tablet Take 1 tablet (40 mg total) by mouth daily. 90 tablet 0   valsartan (DIOVAN) 320 MG tablet Take 1 tablet (320 mg total) by mouth daily. 90 tablet 0   ferrous sulfate 325 (65 FE) MG tablet Take 1 tablet (325 mg total) by mouth 2 (two) times daily with a meal. 60 tablet 2   No facility-administered medications prior to visit.    Allergies  Allergen Reactions   Latex Rash    Latex gloves with powder cause a rash    ROS     Objective:    Physical Exam Constitutional:      General: She is not in acute distress.    Appearance: Normal appearance. She is well-developed.  HENT:     Head: Normocephalic and atraumatic.     Right Ear: External ear normal.     Left Ear: External ear normal.  Eyes:     General: No scleral icterus. Neck:     Thyroid: No thyromegaly.  Cardiovascular:     Rate and Rhythm: Normal rate and regular rhythm.     Heart sounds: Normal heart sounds. No  murmur heard. Pulmonary:     Effort: Pulmonary effort is normal. No respiratory distress.     Breath sounds: Normal breath sounds. No wheezing.  Musculoskeletal:     Cervical back: Neck supple.  Skin:    General: Skin is warm and dry.     Comments: Wounds on left lower extremity are dry/healing  Neurological:     Mental Status: She is alert and oriented to person, place, and time.  Psychiatric:  Mood and Affect: Mood normal.        Behavior: Behavior normal.        Thought Content: Thought content normal.        Judgment: Judgment normal.      BP (!) 174/80   Pulse (!) 52   Temp 98.2 F (36.8 C) (Oral)   Resp 16   Wt 297 lb (134.7 kg)   LMP 01/27/2017   BMI 50.98 kg/m  Wt Readings from Last 3 Encounters:  06/20/23 297 lb (134.7 kg)  06/05/23 298 lb (135.2 kg)  05/08/23 (!) 303 lb (137.4 kg)       Assessment & Plan:   Problem List Items Addressed This Visit       Unprioritized   Leg wound, left    Improving, has follow up with the wound care clinic today.       HTN (hypertension) - Primary    Pt misunderstood our instructions and has been taking diovan 325mg  bid.  Advised pt to bring down to once daily. Continue Carvedilol 25mg  twice daily, add clonidine bid. .   Order renal artery ultrasound to rule out renal artery stenosis as a secondary cause of hypertension. -Refer to Hypertension Clinic for further management. -Recheck blood pressure in two weeks, unless seen at Hypertension Clinic prior. -Recheck renal function today.       Relevant Medications   cloNIDine (CATAPRES) 0.1 MG tablet   Other Relevant Orders   Basic Metabolic Panel (BMET)   US RENAL ARTERY DUPLEX COMPLETE   Refer to [facility] ([id number])   Other Visit Diagnoses     Adult BMI 50.0-59.9 kg/sq m (HCC)   (Chronic)         I am having Pattricia Boss D. Lizama start on cloNIDine. I am also having her maintain her ferrous sulfate, pantoprazole, furosemide, carvedilol, and  valsartan.  Meds ordered this encounter  Medications   cloNIDine (CATAPRES) 0.1 MG tablet    Sig: Take 1 tablet (0.1 mg total) by mouth 2 (two) times daily.    Dispense:  90 tablet    Refill:  3    Order Specific Question:   Supervising Provider    Answer:   Danise Edge A [[id number]]

## 2023-06-26 ENCOUNTER — Encounter: Payer: Self-pay | Admitting: Family

## 2023-06-29 ENCOUNTER — Telehealth: Payer: Self-pay | Admitting: Family

## 2023-06-29 ENCOUNTER — Ambulatory Visit (HOSPITAL_COMMUNITY)
Admission: RE | Admit: 2023-06-29 | Discharge: 2023-06-29 | Disposition: A | Discharge status: Home / Self Care | Payer: BC Managed Care – PPO | Source: Ambulatory Visit | Attending: Internal Medicine | Admitting: Internal Medicine

## 2023-06-29 DIAGNOSIS — I701 Atherosclerosis of renal artery: Secondary | ICD-10-CM | POA: Insufficient documentation

## 2023-06-29 DIAGNOSIS — I10 Essential (primary) hypertension: Secondary | ICD-10-CM | POA: Diagnosis present

## 2023-06-29 NOTE — Telephone Encounter (Signed)
See mychart.  

## 2023-07-17 ENCOUNTER — Other Ambulatory Visit: Payer: Self-pay | Admitting: Family

## 2023-07-18 ENCOUNTER — Telehealth: Payer: Self-pay | Admitting: Family

## 2023-07-18 NOTE — Telephone Encounter (Signed)
See mychart.  

## 2023-08-01 ENCOUNTER — Ambulatory Visit (INDEPENDENT_AMBULATORY_CARE_PROVIDER_SITE_OTHER): Payer: BC Managed Care – PPO | Admitting: Vascular Surgery

## 2023-08-01 VITALS — BP 184/85 | HR 62 | Temp 98.1°F | Ht 64.0 in | Wt 296.8 lb

## 2023-08-01 DIAGNOSIS — I1A0 Resistant hypertension: Secondary | ICD-10-CM | POA: Diagnosis not present

## 2023-08-02 NOTE — Progress Notes (Signed)
[facility] [place]  ASSESSMENT / PLAN: 57 y.o. female with difficult to control hypertension.  Duplex shows evidence of 1 to 59% of the right renal artery.  This is not hemodynamically significant.  Renal indices are preserved.  No abnormal renal artery to aortic ratio.  Kidneys appear normal in size.  Doubt renovascular hypertension as cause for difficult to control hypertension.  She should follow-up with Dr. Duke Salvia as scheduled.  She can follow-up with me as needed.  CHIEF COMPLAINT: Abnormal renal artery duplex  HISTORY OF PRESENT ILLNESS: GLENDALE Bowman is a 58 y.o. female referred to clinic for difficult to control hypertension.  The patient reports she is on 4 drugs.  She still has poor control of high blood pressure.  She underwent renal duplex to evaluate for possible secondary causes of hypertension.  This showed mild renal artery stenosis on the right.  We reviewed these duplex results in detail.  I reviewed the natural history of renovascular hypertension.  I counseled her that this is not likely the source of her difficult to control blood pressure.   Past Medical History:  Diagnosis Date   Anemia    on iron   Asthma    childhood   Hypertension     Past Surgical History:  Procedure Laterality Date   BIOPSY  10/12/2021   Procedure: BIOPSY;  Surgeon: Napoleon Form, MD;  Location: [facility];  Service: Endoscopy;;   COLONOSCOPY     COLONOSCOPY WITH PROPOFOL N/A 02/03/2017   Procedure: COLONOSCOPY WITH PROPOFOL;  Surgeon: Napoleon Form, MD;  Location: [facility];  Service: Endoscopy;  Laterality: N/A;   COLONOSCOPY WITH PROPOFOL N/A 10/12/2021   Procedure: COLONOSCOPY WITH PROPOFOL;  Surgeon: Napoleon Form, MD;  Location: [facility];  Service: Endoscopy;  Laterality: N/A;   ESOPHAGOGASTRODUODENOSCOPY N/A 04/12/2023   Procedure: ESOPHAGOGASTRODUODENOSCOPY (EGD);  Surgeon: Hilarie Fredrickson, MD;  Location: [facility];  Service:  Gastroenterology;  Laterality: N/A;   ESOPHAGOGASTRODUODENOSCOPY (EGD) WITH PROPOFOL N/A 10/12/2021   Procedure: ESOPHAGOGASTRODUODENOSCOPY (EGD) WITH PROPOFOL;  Surgeon: Napoleon Form, MD;  Location: [facility];  Service: Endoscopy;  Laterality: N/A;   NO PAST SURGERIES     POLYPECTOMY  10/12/2021   Procedure: POLYPECTOMY;  Surgeon: Napoleon Form, MD;  Location: [facility];  Service: Endoscopy;;    Family History  Problem Relation Age of Onset   Hypertension Mother    Hyperlipidemia Mother    Diabetes Mother    Lung cancer Father    Thyroid disease Sister    Fibromyalgia Sister    Colon cancer Neg Hx     Social History   Socioeconomic History   Marital status: Single    Spouse name: Not on file   Number of children: Not on file   Years of education: Not on file   Highest education level: Associate degree: occupational, Scientist, product/process development, or vocational program  Occupational History   Not on file  Tobacco Use   Smoking status: Never   Smokeless tobacco: Never  Vaping Use   Vaping status: Never Used  Substance and Sexual Activity   Alcohol use: No   Drug use: No   Sexual activity: Not on file  Other Topics Concern   Not on file  Social History Narrative   1 son- age 22   Unemployed, plans to sub with the [employer]   Associated degree   Lives with mother father/father and son   Has 1 dog lab/spaniel mix  Enjoys reading   Social Determinants of Health   Financial Resource Strain: Low Risk  (04/06/2023)   Overall Financial Resource Strain (CARDIA)    Difficulty of Paying Living Expenses: Not very hard  Food Insecurity: Food Insecurity Present (04/10/2023)   Hunger Vital Sign    Worried About Running Out of Food in the Last Year: Sometimes true    Ran Out of Food in the Last Year: Sometimes true  Transportation Needs: No Transportation Needs (04/10/2023)   PRAPARE - Administrator, Civil Service (Medical): No    Lack of Transportation  (Non-Medical): No  Physical Activity: Insufficiently Active (04/06/2023)   Exercise Vital Sign    Days of Exercise per Week: 4 days    Minutes of Exercise per Session: 30 min  Stress: No Stress Concern Present (04/06/2023)   [employer] of Occupational Health - Occupational Stress Questionnaire    Feeling of Stress : Not at all  Social Connections: Moderately Integrated (04/06/2023)   Social Connection and Isolation Panel [NHANES]    Frequency of Communication with Friends and Family: Once a week    Frequency of Social Gatherings with Friends and Family: Twice a week    Attends Religious Services: 1 to 4 times per year    Active Member of [employer] or Organizations: Yes    Attends Banker Meetings: 1 to 4 times per year    Marital Status: Never married  Intimate Partner Violence: Not At Risk (04/10/2023)   Humiliation, Afraid, Rape, and Kick questionnaire    Fear of Current or Ex-Partner: No    Emotionally Abused: No    Physically Abused: No    Sexually Abused: No    Allergies  Allergen Reactions   Latex Rash    Latex gloves with powder cause a rash    Current Outpatient Medications  Medication Sig Dispense Refill   carvedilol (COREG) 25 MG tablet Take 1 tablet (25 mg total) by mouth 2 (two) times daily with a meal. 180 tablet 1   cloNIDine (CATAPRES) 0.1 MG tablet Take 1 tablet (0.1 mg total) by mouth 2 (two) times daily. 90 tablet 3   furosemide (LASIX) 20 MG tablet Take 1 tablet by mouth once daily as needed for swelling 30 tablet 0   pantoprazole (PROTONIX) 40 MG tablet Take 1 tablet (40 mg total) by mouth daily. 90 tablet 0   valsartan (DIOVAN) 320 MG tablet Take 1 tablet (320 mg total) by mouth daily. 90 tablet 0   ferrous sulfate 325 (65 FE) MG tablet Take 1 tablet (325 mg total) by mouth 2 (two) times daily with a meal. 60 tablet 2   No current facility-administered medications for this visit.    PHYSICAL EXAM Vitals:   08/01/23 1519  BP: (!) 184/85   Pulse: 62  Temp: 98.1 F (36.7 C)  TempSrc: Temporal  SpO2: 96%  Weight: 296 lb 12.8 oz (134.6 kg)  Height: 5\' 4"  (1.626 m)   Obese middle-age woman in no distress Regular rate and rhythm Unlabored breathing   PERTINENT LABORATORY AND RADIOLOGIC DATA  Most recent CBC    Latest Ref Rng & Units 04/28/2023    6:17 PM 04/21/2023    1:44 PM 04/13/2023    5:47 AM  CBC  WBC 4.0 - 10.5 K/uL 6.5  6.4    Hemoglobin 12.0 - 15.0 g/dL 9.5  9.8  95.6   Hematocrit 36.0 - 46.0 % 30.1  30.5  32.2   Platelets 150 -  400 K/uL 275  267.0       Most recent CMP    Latest Ref Rng & Units 06/20/2023   10:47 AM 06/05/2023   12:05 PM 05/22/2023   12:07 PM  CMP  Glucose 70 - 99 mg/dL 83  95  92   BUN 6 - 23 mg/dL 15  10  11    Creatinine 0.40 - 1.20 mg/dL 1.61  0.96  0.45   Sodium 135 - 145 mEq/L 141  142  142   Potassium 3.5 - 5.1 mEq/L 3.6  3.6  3.9   Chloride 96 - 112 mEq/L 102  101  102   CO2 19 - 32 mEq/L 29  30  32   Calcium 8.4 - 10.5 mg/dL 9.0  8.5  8.7     Renal function CrCl cannot be calculated (Patient's most recent lab result is older than the maximum 21 days allowed.).  Hgb A1c MFr Bld (%)  Date Value  04/16/2021 5.8    LDL Cholesterol  Date Value Ref Range Status  09/20/2017 121 (H) 0 - 99 mg/dL Final      Renal complex:    Right: 1-59% stenosis of the right renal artery. RRV flow present.         Normal size right kidney. Normal right Resisitive Index.         Normal cortical thickness of right kidney.  Left:  No evidence of left renal artery stenosis. LRV flow present.         Normal size of left kidney. Normal left Resistive Index.         Normal cortical thickness of the left kidney.  Mesenteric:  Normal Celiac artery and Superior Mesenteric artery findings. Areas of  limited  visceral study include left renal artery, left kidney size and left  parenchymal  flow.   Rande Brunt. Lenell Antu, MD FACS [facility] Phone Number:  [phone] 08/02/2023 10:14 AM   Total time spent on preparing this encounter including chart review, data review, collecting history, examining the patient, coordinating care for this new patient, 45 minutes.  Portions of this report may have been transcribed using voice recognition software.  Every effort has been made to ensure accuracy; however, inadvertent computerized transcription errors may still be present.

## 2023-08-05 ENCOUNTER — Encounter (HOSPITAL_BASED_OUTPATIENT_CLINIC_OR_DEPARTMENT_OTHER): Payer: BC Managed Care – PPO | Admitting: Radiology

## 2023-08-05 DIAGNOSIS — Z1231 Encounter for screening mammogram for malignant neoplasm of breast: Secondary | ICD-10-CM

## 2023-08-09 ENCOUNTER — Institutional Professional Consult (permissible substitution) (HOSPITAL_BASED_OUTPATIENT_CLINIC_OR_DEPARTMENT_OTHER): Payer: BC Managed Care – PPO | Admitting: Cardiovascular Disease

## 2023-08-09 NOTE — Progress Notes (Deleted)
Advanced Hypertension Clinic Initial Assessment:    Date:  08/09/2023   ID:  Marissa Bowman, DOB Feb 13, 1966, MRN [id number]  PCP:  Sandford Craze, NP  Cardiologist:  None  Nephrologist:  Referring MD: Sandford Craze, NP   CC: Hypertension  History of Present Illness:    Marissa Bowman is a 57 y.o. female with a hx of hypertension, renal artery stenosis, and morbid obesity here to establish care in the advance hypertension clinic.  She has been working with her primary care provider and blood pressures have remained uncontrolled despite being on losartan 2020 mg and carvedilol 25 mg twice daily.  Clonidine was added.  Renal artery Dopplers revealed 1-59% stenosis on the right and normal blood flow on the left.  She was referred to advanced hypertension clinic for further evaluation and management.   Previous antihypertensives: Amlodipine-gingival hyperplasia   Past Medical History:  Diagnosis Date   Anemia    on iron   Asthma    childhood   Hypertension     Past Surgical History:  Procedure Laterality Date   BIOPSY  10/12/2021   Procedure: BIOPSY;  Surgeon: Napoleon Form, MD;  Location: [facility];  Service: Endoscopy;;   COLONOSCOPY     COLONOSCOPY WITH PROPOFOL N/A 02/03/2017   Procedure: COLONOSCOPY WITH PROPOFOL;  Surgeon: Napoleon Form, MD;  Location: [facility];  Service: Endoscopy;  Laterality: N/A;   COLONOSCOPY WITH PROPOFOL N/A 10/12/2021   Procedure: COLONOSCOPY WITH PROPOFOL;  Surgeon: Napoleon Form, MD;  Location: [facility];  Service: Endoscopy;  Laterality: N/A;   ESOPHAGOGASTRODUODENOSCOPY N/A 04/12/2023   Procedure: ESOPHAGOGASTRODUODENOSCOPY (EGD);  Surgeon: Hilarie Fredrickson, MD;  Location: [facility];  Service: Gastroenterology;  Laterality: N/A;   ESOPHAGOGASTRODUODENOSCOPY (EGD) WITH PROPOFOL N/A 10/12/2021   Procedure: ESOPHAGOGASTRODUODENOSCOPY (EGD) WITH PROPOFOL;  Surgeon: Napoleon Form, MD;  Location: [facility];  Service: Endoscopy;  Laterality: N/A;   NO PAST SURGERIES     POLYPECTOMY  10/12/2021   Procedure: POLYPECTOMY;  Surgeon: Napoleon Form, MD;  Location: [facility];  Service: Endoscopy;;    Current Medications: No outpatient medications have been marked as taking for the 08/09/23 encounter (Appointment) with Chilton Si, MD.     Allergies:   Latex   Social History   Socioeconomic History   Marital status: Single    Spouse name: Not on file   Number of children: Not on file   Years of education: Not on file   Highest education level: Associate degree: occupational, Scientist, product/process development, or vocational program  Occupational History   Not on file  Tobacco Use   Smoking status: Never   Smokeless tobacco: Never  Vaping Use   Vaping status: Never Used  Substance and Sexual Activity   Alcohol use: No   Drug use: No   Sexual activity: Not on file  Other Topics Concern   Not on file  Social History Narrative   1 son- age 48   Unemployed, plans to sub with the [employer]   Associated degree   Lives with mother father/father and son   Has 1 dog lab/spaniel mix   Enjoys reading   Social Determinants of Health   Financial Resource Strain: Low Risk  (04/06/2023)   Overall Financial Resource Strain (CARDIA)    Difficulty of Paying Living Expenses: Not very hard  Food Insecurity: Food Insecurity Present (04/10/2023)   Hunger Vital Sign    Worried About Running Out of Food in the Last Year: Sometimes  true    Ran Out of Food in the Last Year: Sometimes true  Transportation Needs: No Transportation Needs (04/10/2023)   PRAPARE - Administrator, Civil Service (Medical): No    Lack of Transportation (Non-Medical): No  Physical Activity: Insufficiently Active (04/06/2023)   Exercise Vital Sign    Days of Exercise per Week: 4 days    Minutes of Exercise per Session: 30 min  Stress: No Stress Concern Present (04/06/2023)   [employer] of Occupational Health  - Occupational Stress Questionnaire    Feeling of Stress : Not at all  Social Connections: Moderately Integrated (04/06/2023)   Social Connection and Isolation Panel [NHANES]    Frequency of Communication with Friends and Family: Once a week    Frequency of Social Gatherings with Friends and Family: Twice a week    Attends Religious Services: 1 to 4 times per year    Active Member of [employer] or Organizations: Yes    Attends Banker Meetings: 1 to 4 times per year    Marital Status: Never married     Family History: The patient's ***family history includes Diabetes in her mother; Fibromyalgia in her sister; Hyperlipidemia in her mother; Hypertension in her mother; Lung cancer in her father; Thyroid disease in her sister. There is no history of Colon cancer.  ROS:   Please see the history of present illness.    *** All other systems reviewed and are negative.  EKGs/Labs/Other Studies Reviewed:    EKG:  EKG is *** ordered today.  The ekg ordered today demonstrates ***  Recent Labs: 04/10/2023: B Natriuretic Peptide 26.5 04/12/2023: Magnesium 2.1 04/28/2023: ALT 15; Hemoglobin 9.5; Platelets 275 06/20/2023: BUN 15; Creatinine, Ser 0.89; Potassium 3.6; Sodium 141   Recent Lipid Panel    Component Value Date/Time   CHOL 201 (H) 09/20/2017 0824   TRIG 84.0 09/20/2017 0824   HDL 63.20 09/20/2017 0824   CHOLHDL 3 09/20/2017 0824   VLDL 16.8 09/20/2017 0824   LDLCALC 121 (H) 09/20/2017 0824    Physical Exam:   VS:  LMP 01/27/2017  , BMI There is no height or weight on file to calculate BMI. GENERAL:  Well appearing HEENT: Pupils equal round and reactive, fundi not visualized, oral mucosa unremarkable NECK:  No jugular venous distention, waveform within normal limits, carotid upstroke brisk and symmetric, no bruits, no thyromegaly LYMPHATICS:  No cervical adenopathy LUNGS:  Clear to auscultation bilaterally HEART:  RRR.  PMI not displaced or sustained,S1 and S2 within normal  limits, no S3, no S4, no clicks, no rubs, *** murmurs ABD:  Flat, positive bowel sounds normal in frequency in pitch, no bruits, no rebound, no guarding, no midline pulsatile mass, no hepatomegaly, no splenomegaly EXT:  2 plus pulses throughout, no edema, no cyanosis no clubbing SKIN:  No rashes no nodules NEURO:  Cranial nerves II through XII grossly intact, motor grossly intact throughout PSYCH:  Cognitively intact, oriented to person place and time   ASSESSMENT/PLAN:    No problem-specific Assessment & Plan notes found for this encounter.   Screening for Secondary Hypertension: { Click here to document screening for secondary causes of HTN  :[id number]}    Relevant Labs/Studies:    Latest Ref Rng & Units 06/20/2023   10:47 AM 06/05/2023   12:05 PM 05/22/2023   12:07 PM  Basic Labs  Sodium 135 - 145 mEq/L 141  142  142   Potassium 3.5 - 5.1 mEq/L 3.6  3.6  3.9   Creatinine 0.40 - 1.20 mg/dL 8.46  9.62  9.52        Latest Ref Rng & Units 09/20/2017    8:24 AM 01/11/2016   11:03 AM  Thyroid   TSH 0.35 - 4.50 uIU/mL 3.14  2.47                 06/29/2023    9:44 AM  Renovascular   Renal Artery [place] Completed Yes      Disposition:    FU with MD/PharmD in {gen number [id number]} {Days to years:10300}    Medication Adjustments/Labs and Tests Ordered: Current medicines are reviewed at length with the patient today.  Concerns regarding medicines are outlined above.  No orders of the defined types were placed in this encounter.  No orders of the defined types were placed in this encounter.    Signed, Chilton Si, MD  08/09/2023 1:53 PM    [facility]

## 2023-08-15 ENCOUNTER — Other Ambulatory Visit: Payer: Self-pay | Admitting: Family

## 2023-08-16 MED ORDER — PANTOPRAZOLE SODIUM 40 MG PO TBEC: 40.0000 mg | DELAYED_RELEASE_TABLET | Freq: Every day | ORAL | 0 refills | Status: DC

## 2023-08-28 ENCOUNTER — Other Ambulatory Visit: Payer: Self-pay | Admitting: Family

## 2023-08-28 DIAGNOSIS — I10 Essential (primary) hypertension: Secondary | ICD-10-CM

## 2023-09-01 ENCOUNTER — Ambulatory Visit (INDEPENDENT_AMBULATORY_CARE_PROVIDER_SITE_OTHER): Payer: BC Managed Care – PPO | Admitting: Family

## 2023-09-01 ENCOUNTER — Other Ambulatory Visit: Payer: Self-pay | Admitting: Family

## 2023-09-01 VITALS — BP 160/95 | HR 55 | Temp 98.6°F | Resp 16 | Ht 68.0 in | Wt 298.0 lb

## 2023-09-01 DIAGNOSIS — I10 Essential (primary) hypertension: Secondary | ICD-10-CM | POA: Diagnosis not present

## 2023-09-01 DIAGNOSIS — S81802A Unspecified open wound, left lower leg, initial encounter: Secondary | ICD-10-CM | POA: Diagnosis not present

## 2023-09-01 DIAGNOSIS — I701 Atherosclerosis of renal artery: Secondary | ICD-10-CM

## 2023-09-01 MED ORDER — HYDROCHLOROTHIAZIDE 25 MG PO TABS
25.0000 mg | ORAL_TABLET | Freq: Every day | ORAL | 5 refills | Status: DC
Start: 2023-09-01 — End: 2023-10-19

## 2023-09-01 MED ORDER — POTASSIUM CHLORIDE CRYS ER 10 MEQ PO TBCR
10.0000 meq | EXTENDED_RELEASE_TABLET | Freq: Two times a day (BID) | ORAL | 5 refills | Status: DC
Start: 2023-09-01 — End: 2023-11-24

## 2023-09-01 NOTE — Assessment & Plan Note (Signed)
Not felt to be clinically significant per vascular.

## 2023-09-01 NOTE — Assessment & Plan Note (Signed)
  Difficult to control despite max doses of Diovan 320mg , Clonidine 0.1mg  BID, and Carvedilol 25mg  BID. Vascular consult revealed some narrowing of the right renal artery but not significant enough to explain the difficult to control blood pressure. Patient adherent to medication regimen. -Add Hydrochlorothiazide back to regimen. -Add Potassium 10mg  daily to counteract potential drop in potassium due to Hydrochlorothiazide. -Check blood pressure and potassium levels in one week. -Pt to keep upcoming appointment with the Advanced Hypertension Clinic

## 2023-09-01 NOTE — Patient Instructions (Signed)
VISIT SUMMARY:  During today's visit, we discussed your ongoing high blood pressure and leg issues. Despite your adherence to the current medication regimen, your blood pressure remains high. We also reviewed the findings from your vascular consultation. Additionally, we noted the significant improvement in your leg condition.  YOUR PLAN:  -HYPERTENSION: Hypertension, or high blood pressure, is when the force of the blood against your artery walls is too high. Despite taking Diovan, Clonidine, and Carvedilol, your blood pressure remains high. We will add Hydrochlorothiazide to your regimen and Potassium to counteract any potential drop in potassium levels. Please check your blood pressure and potassium levels in one week.  -LEG WOUND: Your leg wound has healed well, and you should continue wearing compression stockings daily. No further action is needed at this time.  -GENERAL HEALTH MAINTENANCE: Continue taking a daily baby aspirin (81mg ) as advised by your vascular doctor. Keep your upcoming appointment with cardiology in January at the hypertension clinic.  INSTRUCTIONS:  Please check your blood pressure and potassium levels in one week. Keep your upcoming appointment with cardiology in January at the hypertension clinic.

## 2023-09-01 NOTE — Assessment & Plan Note (Signed)
 Resolved. Continue compression stockings

## 2023-09-01 NOTE — Progress Notes (Signed)
Subjective:     Patient ID: Marissa Bowman, female    DOB: [birth year], 56 y.o.   MRN: [id number]  Chief Complaint  Patient presents with   Hypertension    Here for follow up    Hypertension    Discussed the use of AI scribe software for clinical note transcription with the patient, who gave verbal consent to proceed.  History of Present Illness   The patient, with a history of hypertension and leg ulcers, presents with persistently high blood pressure despite being on Diovan 320, clonidine 0.1mg   twice a day, and carvedilol 25 twice a day. The patient has been compliant with the medication regimen, taking the medications in the morning before work and at night before bed. The patient has also been under the care of a vascular doctor, Dr. Lenell Antu, due to finding of some narrowing of the right renal artery. He did not think that the stenosis was significant enough to explain the patient's high blood pressure. The patient was advised by Dr. Lenell Antu to take a daily aspirin.  In addition to the hypertension, the patient has been dealing with some left leg wounds, which has improved significantly. The patient has been discharged from the wound care clinic and is now required to wear compression stockings daily.          Health Maintenance Due  Topic Date Due   INFLUENZA VACCINE  05/18/2023   DTaP/Tdap/Td (2 - Td or Tdap) 08/07/2023   MAMMOGRAM  08/10/2023    Past Medical History:  Diagnosis Date   Anemia    on iron   Asthma    childhood   Hypertension     Past Surgical History:  Procedure Laterality Date   BIOPSY  10/12/2021   Procedure: BIOPSY;  Surgeon: Napoleon Form, MD;  Location: [facility];  Service: Endoscopy;;   COLONOSCOPY     COLONOSCOPY WITH PROPOFOL N/A 02/03/2017   Procedure: COLONOSCOPY WITH PROPOFOL;  Surgeon: Napoleon Form, MD;  Location: [facility];  Service: Endoscopy;  Laterality: N/A;   COLONOSCOPY WITH PROPOFOL N/A 10/12/2021    Procedure: COLONOSCOPY WITH PROPOFOL;  Surgeon: Napoleon Form, MD;  Location: [facility];  Service: Endoscopy;  Laterality: N/A;   ESOPHAGOGASTRODUODENOSCOPY N/A 04/12/2023   Procedure: ESOPHAGOGASTRODUODENOSCOPY (EGD);  Surgeon: Hilarie Fredrickson, MD;  Location: [facility];  Service: Gastroenterology;  Laterality: N/A;   ESOPHAGOGASTRODUODENOSCOPY (EGD) WITH PROPOFOL N/A 10/12/2021   Procedure: ESOPHAGOGASTRODUODENOSCOPY (EGD) WITH PROPOFOL;  Surgeon: Napoleon Form, MD;  Location: [facility];  Service: Endoscopy;  Laterality: N/A;   NO PAST SURGERIES     POLYPECTOMY  10/12/2021   Procedure: POLYPECTOMY;  Surgeon: Napoleon Form, MD;  Location: [facility];  Service: Endoscopy;;    Family History  Problem Relation Age of Onset   Hypertension Mother    Hyperlipidemia Mother    Diabetes Mother    Lung cancer Father    Thyroid disease Sister    Fibromyalgia Sister    Colon cancer Neg Hx     Social History   Socioeconomic History   Marital status: Single    Spouse name: Not on file   Number of children: Not on file   Years of education: Not on file   Highest education level: Associate degree: occupational, Scientist, product/process development, or vocational program  Occupational History   Not on file  Tobacco Use   Smoking status: Never   Smokeless tobacco: Never  Vaping Use   Vaping status: Never Used  Substance  and Sexual Activity   Alcohol use: No   Drug use: No   Sexual activity: Not on file  Other Topics Concern   Not on file  Social History Narrative   1 son- age 55   Unemployed, plans to sub with the [employer]   Associated degree   Lives with mother father/father and son   Has 1 dog lab/spaniel mix   Enjoys reading   Social Determinants of Health   Financial Resource Strain: High Risk (08/27/2023)   Overall Financial Resource Strain (CARDIA)    Difficulty of Paying Living Expenses: Hard  Food Insecurity: Food Insecurity Present (08/27/2023)   Hunger Vital Sign     Worried About Running Out of Food in the Last Year: Never true    Ran Out of Food in the Last Year: Sometimes true  Transportation Needs: No Transportation Needs (08/27/2023)   PRAPARE - Administrator, Civil Service (Medical): No    Lack of Transportation (Non-Medical): No  Physical Activity: Insufficiently Active (08/27/2023)   Exercise Vital Sign    Days of Exercise per Week: 4 days    Minutes of Exercise per Session: 30 min  Stress: No Stress Concern Present (08/27/2023)   [employer] of Occupational Health - Occupational Stress Questionnaire    Feeling of Stress : Not at all  Social Connections: Moderately Isolated (08/27/2023)   Social Connection and Isolation Panel [NHANES]    Frequency of Communication with Friends and Family: Three times a week    Frequency of Social Gatherings with Friends and Family: Twice a week    Attends Religious Services: Never    Database administrator or Organizations: Yes    Attends Banker Meetings: 1 to 4 times per year    Marital Status: Never married  Intimate Partner Violence: Not At Risk (04/10/2023)   Humiliation, Afraid, Rape, and Kick questionnaire    Fear of Current or Ex-Partner: No    Emotionally Abused: No    Physically Abused: No    Sexually Abused: No    Outpatient Medications Prior to Visit  Medication Sig Dispense Refill   carvedilol (COREG) 25 MG tablet Take 1 tablet (25 mg total) by mouth 2 (two) times daily with a meal. 180 tablet 1   cloNIDine (CATAPRES) 0.1 MG tablet Take 1 tablet (0.1 mg total) by mouth 2 (two) times daily. 90 tablet 3   furosemide (LASIX) 20 MG tablet Take 1 tablet by mouth once daily as needed for swelling 30 tablet 0   pantoprazole (PROTONIX) 40 MG tablet Take 1 tablet (40 mg total) by mouth daily. 90 tablet 0   valsartan (DIOVAN) 320 MG tablet TAKE ONE TABLET BY MOUTH ONE TIME DAILY 90 tablet 0   ferrous sulfate 325 (65 FE) MG tablet Take 1 tablet (325 mg total) by mouth 2  (two) times daily with a meal. 60 tablet 2   No facility-administered medications prior to visit.    Allergies  Allergen Reactions   Latex Rash    Latex gloves with powder cause a rash    ROS    See HPI  Objective:    Physical Exam Constitutional:      General: She is not in acute distress.    Appearance: Normal appearance. She is well-developed.  HENT:     Head: Normocephalic and atraumatic.     Right Ear: External ear normal.     Left Ear: External ear normal.  Eyes:  General: No scleral icterus. Neck:     Thyroid: No thyromegaly.  Cardiovascular:     Rate and Rhythm: Normal rate and regular rhythm.     Heart sounds: Normal heart sounds. No murmur heard. Pulmonary:     Effort: Pulmonary effort is normal. No respiratory distress.     Breath sounds: Normal breath sounds. No wheezing.  Musculoskeletal:     Cervical back: Neck supple.  Skin:    General: Skin is warm and dry.     Comments: Some hyperpigmentation noted at previous wound sites left shin. No wounds remain  Neurological:     Mental Status: She is alert and oriented to person, place, and time.  Psychiatric:        Mood and Affect: Mood normal.        Behavior: Behavior normal.        Thought Content: Thought content normal.        Judgment: Judgment normal.      BP (!) 160/95   Pulse (!) 55   Temp 98.6 F (37 C) (Oral)   Resp 16   Ht 5\' 8"  (1.727 m)   Wt 298 lb (135.2 kg)   LMP 01/27/2017   SpO2 100%   BMI 45.31 kg/m  Wt Readings from Last 3 Encounters:  09/01/23 298 lb (135.2 kg)  08/01/23 296 lb 12.8 oz (134.6 kg)  06/20/23 297 lb (134.7 kg)       Assessment & Plan:   Problem List Items Addressed This Visit       Unprioritized   Renal artery stenosis (HCC)    Not felt to be clinically significant per vascular.       Relevant Medications   hydrochlorothiazide (HYDRODIURIL) 25 MG tablet   Leg wound, left    Resolved. Continue compression stockings.       HTN  (hypertension) - Primary     Difficult to control despite max doses of Diovan 320mg , Clonidine 0.1mg  BID, and Carvedilol 25mg  BID. Vascular consult revealed some narrowing of the right renal artery but not significant enough to explain the difficult to control blood pressure. Patient adherent to medication regimen. -Add Hydrochlorothiazide back to regimen. -Add Potassium 10mg  daily to counteract potential drop in potassium due to Hydrochlorothiazide. -Check blood pressure and potassium levels in one week. -Pt to keep upcoming [facility]       Relevant Medications   potassium chloride (KLOR-CON M) 10 MEQ tablet   hydrochlorothiazide (HYDRODIURIL) 25 MG tablet    I am having Marissa Bowman start on potassium chloride and hydrochlorothiazide. I am also having her maintain her ferrous sulfate, furosemide, carvedilol, cloNIDine, pantoprazole, and valsartan.  Meds ordered this encounter  Medications   potassium chloride (KLOR-CON M) 10 MEQ tablet    Sig: Take 1 tablet (10 mEq total) by mouth 2 (two) times daily.    Dispense:  30 tablet    Refill:  5    Order Specific Question:   Supervising Provider    Answer:   Danise Edge A [[id number]]   hydrochlorothiazide (HYDRODIURIL) 25 MG tablet    Sig: Take 1 tablet (25 mg total) by mouth daily.    Dispense:  30 tablet    Refill:  5    Order Specific Question:   Supervising Provider    Answer:   Danise Edge A [[id number]]

## 2023-09-02 ENCOUNTER — Encounter: Payer: Self-pay | Admitting: Family

## 2023-09-08 ENCOUNTER — Ambulatory Visit (INDEPENDENT_AMBULATORY_CARE_PROVIDER_SITE_OTHER): Payer: BC Managed Care – PPO | Admitting: Family

## 2023-09-08 VITALS — BP 135/92 | HR 60 | Temp 98.0°F | Resp 16 | Ht 68.0 in | Wt 289.0 lb

## 2023-09-08 DIAGNOSIS — Z23 Encounter for immunization: Secondary | ICD-10-CM

## 2023-09-08 DIAGNOSIS — I10 Essential (primary) hypertension: Secondary | ICD-10-CM

## 2023-09-08 NOTE — Assessment & Plan Note (Signed)
  Blood pressure improved from 184 to 153 systolic after adding hydrochlorothiazide 25mg  and potassium 10mg  to existing regimen of Diovan 320mg , clonidine 0.1mg , and carvedilol 25mg  BID. Second reading today was 135/92. -Continue current medication regimen. -Check potassium due to addition of hydrochlorothiazide. -Keep upcoming appointment with hypertension clinic. -Follow-up in three months.

## 2023-09-08 NOTE — Progress Notes (Unsigned)
Subjective:     Patient ID: Marissa Bowman, female    DOB: Aug 11, 1966, 57 y.o.   MRN: [id number]  Chief Complaint  Patient presents with   Hypertension    Here for follow up    HPI  Discussed the use of AI scribe software for clinical note transcription with the patient, who gave verbal consent to proceed.  History of Present Illness   The patient, with a history of hypertension, presents for a follow-up visit. She has been on a regimen of Diovan 320 mg, clonidine 0.1 mg , carvedilol 25 mg twice a day, hydrochlorothiazide 25 mg, and potassium 10 mEQ.  The patient's blood pressure reading during this visit was 153 over 60, which is an improvement from the previous reading of 184. Repeat reading is 135/92.  However, the goal is to get the blood pressure under 140. The patient also mentions a case of disability, which has been causing some distress due to issues with documentation and communication with the disability service. The patient is also due for a mammogram and tetanus shot, and requests a flu shot for her and her mother.       BP Readings from Last 3 Encounters:  09/08/23 (!) 135/92  09/01/23 (!) 160/95  08/01/23 (!) 184/85      Health Maintenance Due  Topic Date Due   MAMMOGRAM  08/10/2023    Past Medical History:  Diagnosis Date   Anemia    on iron   Asthma    childhood   Hypertension     Past Surgical History:  Procedure Laterality Date   BIOPSY  10/12/2021   Procedure: BIOPSY;  Surgeon: Napoleon Form, MD;  Location: [facility];  Service: Endoscopy;;   COLONOSCOPY     COLONOSCOPY WITH PROPOFOL N/A 02/03/2017   Procedure: COLONOSCOPY WITH PROPOFOL;  Surgeon: Napoleon Form, MD;  Location: [facility];  Service: Endoscopy;  Laterality: N/A;   COLONOSCOPY WITH PROPOFOL N/A 10/12/2021   Procedure: COLONOSCOPY WITH PROPOFOL;  Surgeon: Napoleon Form, MD;  Location: [facility];  Service: Endoscopy;  Laterality: N/A;    ESOPHAGOGASTRODUODENOSCOPY N/A 04/12/2023   Procedure: ESOPHAGOGASTRODUODENOSCOPY (EGD);  Surgeon: Hilarie Fredrickson, MD;  Location: [facility];  Service: Gastroenterology;  Laterality: N/A;   ESOPHAGOGASTRODUODENOSCOPY (EGD) WITH PROPOFOL N/A 10/12/2021   Procedure: ESOPHAGOGASTRODUODENOSCOPY (EGD) WITH PROPOFOL;  Surgeon: Napoleon Form, MD;  Location: [facility];  Service: Endoscopy;  Laterality: N/A;   NO PAST SURGERIES     POLYPECTOMY  10/12/2021   Procedure: POLYPECTOMY;  Surgeon: Napoleon Form, MD;  Location: [facility];  Service: Endoscopy;;    Family History  Problem Relation Age of Onset   Hypertension Mother    Hyperlipidemia Mother    Diabetes Mother    Lung cancer Father    Thyroid disease Sister    Fibromyalgia Sister    Colon cancer Neg Hx     Social History   Socioeconomic History   Marital status: Single    Spouse name: Not on file   Number of children: Not on file   Years of education: Not on file   Highest education level: Associate degree: occupational, Scientist, product/process development, or vocational program  Occupational History   Not on file  Tobacco Use   Smoking status: Never   Smokeless tobacco: Never  Vaping Use   Vaping status: Never Used  Substance and Sexual Activity   Alcohol use: No   Drug use: No   Sexual activity: Not on file  Other Topics Concern   Not on file  Social History Narrative   1 son- age 70   Unemployed, plans to sub with the [employer]   Associated degree   Lives with mother father/father and son   Has 1 dog lab/spaniel mix   Enjoys reading   Social Determinants of Health   Financial Resource Strain: High Risk (08/27/2023)   Overall Financial Resource Strain (CARDIA)    Difficulty of Paying Living Expenses: Hard  Food Insecurity: Food Insecurity Present (08/27/2023)   Hunger Vital Sign    Worried About Running Out of Food in the Last Year: Never true    Ran Out of Food in the Last Year: Sometimes true  Transportation Needs:  No Transportation Needs (08/27/2023)   PRAPARE - Administrator, Civil Service (Medical): No    Lack of Transportation (Non-Medical): No  Physical Activity: Insufficiently Active (08/27/2023)   Exercise Vital Sign    Days of Exercise per Week: 4 days    Minutes of Exercise per Session: 30 min  Stress: No Stress Concern Present (08/27/2023)   [employer] of Occupational Health - Occupational Stress Questionnaire    Feeling of Stress : Not at all  Social Connections: Moderately Isolated (08/27/2023)   Social Connection and Isolation Panel [NHANES]    Frequency of Communication with Friends and Family: Three times a week    Frequency of Social Gatherings with Friends and Family: Twice a week    Attends Religious Services: Never    Database administrator or Organizations: Yes    Attends Banker Meetings: 1 to 4 times per year    Marital Status: Never married  Intimate Partner Violence: Not At Risk (04/10/2023)   Humiliation, Afraid, Rape, and Kick questionnaire    Fear of Current or Ex-Partner: No    Emotionally Abused: No    Physically Abused: No    Sexually Abused: No    Outpatient Medications Prior to Visit  Medication Sig Dispense Refill   carvedilol (COREG) 25 MG tablet Take 1 tablet (25 mg total) by mouth 2 (two) times daily with a meal. 180 tablet 1   cloNIDine (CATAPRES) 0.1 MG tablet Take 1 tablet (0.1 mg total) by mouth 2 (two) times daily. 90 tablet 3   furosemide (LASIX) 20 MG tablet Take 1 tablet by mouth once daily as needed for swelling 30 tablet 0   hydrochlorothiazide (HYDRODIURIL) 25 MG tablet Take 1 tablet (25 mg total) by mouth daily. 30 tablet 5   pantoprazole (PROTONIX) 40 MG tablet TAKE ONE TABLET BY MOUTH ONE TIME DAILY 90 tablet 0   potassium chloride (KLOR-CON M) 10 MEQ tablet Take 1 tablet (10 mEq total) by mouth 2 (two) times daily. 30 tablet 5   valsartan (DIOVAN) 320 MG tablet TAKE ONE TABLET BY MOUTH ONE TIME DAILY 90 tablet 0    ferrous sulfate 325 (65 FE) MG tablet Take 1 tablet (325 mg total) by mouth 2 (two) times daily with a meal. 60 tablet 2   No facility-administered medications prior to visit.    Allergies  Allergen Reactions   Latex Rash    Latex gloves with powder cause a rash    ROS See HPI    Objective:    Physical Exam Constitutional:      General: She is not in acute distress.    Appearance: Normal appearance. She is well-developed.  HENT:     Head: Normocephalic and atraumatic.  Right Ear: External ear normal.     Left Ear: External ear normal.  Eyes:     General: No scleral icterus. Neck:     Thyroid: No thyromegaly.  Cardiovascular:     Rate and Rhythm: Normal rate and regular rhythm.     Heart sounds: Normal heart sounds. No murmur heard. Pulmonary:     Effort: Pulmonary effort is normal. No respiratory distress.     Breath sounds: Normal breath sounds. No wheezing.  Musculoskeletal:     Cervical back: Neck supple.  Skin:    General: Skin is warm and dry.  Neurological:     Mental Status: She is alert and oriented to person, place, and time.  Psychiatric:        Mood and Affect: Mood normal.        Behavior: Behavior normal.        Thought Content: Thought content normal.        Judgment: Judgment normal.      BP (!) 135/92   Pulse 60   Temp 98 F (36.7 C) (Oral)   Resp 16   Ht 5\' 8"  (1.727 m)   Wt 289 lb (131.1 kg)   LMP 01/27/2017   SpO2 100%   BMI 43.94 kg/m  Wt Readings from Last 3 Encounters:  09/08/23 289 lb (131.1 kg)  09/01/23 298 lb (135.2 kg)  08/01/23 296 lb 12.8 oz (134.6 kg)       Assessment & Plan:   Problem List Items Addressed This Visit       Unprioritized   HTN (hypertension) - Primary     Blood pressure improved from 184 to 153 systolic after adding hydrochlorothiazide 25mg  and potassium 10mg  to existing regimen of Diovan 320mg , clonidine 0.1mg , and carvedilol 25mg  BID. Second reading today was 135/92. -Continue current  medication regimen. -Check potassium due to addition of hydrochlorothiazide. -Keep upcoming appointment with hypertension clinic. -Follow-up in three months.      Relevant Orders   Basic metabolic panel (Completed)   Other Visit Diagnoses     Need for Td vaccine       Relevant Orders   Td : Tetanus/diphtheria >7yo Preservative  free (Completed)   Needs flu shot       Relevant Orders   Flu vaccine trivalent PF, 6mos and older(Flulaval,Afluria,Fluarix,Fluzone) (Completed)       General Health Maintenance -Scheduled mammogram was cancelled, needs to be rescheduled. -Administer flu shot and tetanus shot today. I am having Kennith Gain. Melerine maintain her ferrous sulfate, furosemide, carvedilol, cloNIDine, valsartan, potassium chloride, hydrochlorothiazide, and pantoprazole.  No orders of the defined types were placed in this encounter.

## 2023-09-09 LAB — BASIC METABOLIC PANEL
BUN/Creatinine Ratio: 20 (calc) (ref 6–22)
BUN: 21 mg/dL (ref 7–25)
CO2: 30 mmol/L (ref 20–32)
Calcium: 9.2 mg/dL (ref 8.6–10.4)
Chloride: 99 mmol/L (ref 98–110)
Creat: 1.07 mg/dL — ABNORMAL HIGH (ref 0.50–1.03)
Glucose, Bld: 101 mg/dL — ABNORMAL HIGH (ref 65–99)
Potassium: 4 mmol/L (ref 3.5–5.3)
Sodium: 140 mmol/L (ref 135–146)

## 2023-09-11 NOTE — Patient Instructions (Signed)
VISIT SUMMARY:  During today's visit, we reviewed your blood pressure management and discussed your general health maintenance. Your blood pressure has shown improvement, but we aim to get it under 140. We also addressed your upcoming mammogram and tetanus shot, and administered the flu shot as requested.  YOUR PLAN:  -HYPERTENSION: Hypertension, or high blood pressure, is when the force of the blood against your artery walls is too high. Your blood pressure has improved from 184 to 153 systolic after adding hydrochlorothiazide and potassium to your existing regimen. We will continue your current medications and check your potassium levels due to the new medication. Please keep your upcoming appointment with the hypertension clinic and follow up with [place] in three months.  -GENERAL HEALTH MAINTENANCE: We need to reschedule your mammogram as the previous appointment was cancelled. Today, we administered your flu shot and tetanus shot as requested.  INSTRUCTIONS:  Please keep your upcoming appointment with the hypertension clinic and follow up with [place] in three months. Additionally, reschedule your mammogram as soon as possible.

## 2023-09-12 ENCOUNTER — Other Ambulatory Visit: Payer: Self-pay | Admitting: Family

## 2023-09-12 DIAGNOSIS — Z1231 Encounter for screening mammogram for malignant neoplasm of breast: Secondary | ICD-10-CM

## 2023-09-18 ENCOUNTER — Other Ambulatory Visit: Payer: Self-pay | Admitting: Family

## 2023-09-25 ENCOUNTER — Ambulatory Visit: Payer: BC Managed Care – PPO | Admitting: Gastroenterology

## 2023-10-12 DIAGNOSIS — Z1231 Encounter for screening mammogram for malignant neoplasm of breast: Secondary | ICD-10-CM

## 2023-10-16 ENCOUNTER — Encounter (HOSPITAL_BASED_OUTPATIENT_CLINIC_OR_DEPARTMENT_OTHER): Payer: Self-pay | Admitting: Family

## 2023-10-19 ENCOUNTER — Ambulatory Visit (INDEPENDENT_AMBULATORY_CARE_PROVIDER_SITE_OTHER): Payer: 59 | Admitting: Family

## 2023-10-19 ENCOUNTER — Encounter (HOSPITAL_BASED_OUTPATIENT_CLINIC_OR_DEPARTMENT_OTHER): Payer: Self-pay

## 2023-10-19 VITALS — BP 133/84 | HR 55 | Ht 68.0 in | Wt 291.7 lb

## 2023-10-19 DIAGNOSIS — R001 Bradycardia, unspecified: Secondary | ICD-10-CM

## 2023-10-19 DIAGNOSIS — I10 Essential (primary) hypertension: Secondary | ICD-10-CM | POA: Diagnosis not present

## 2023-10-19 MED ORDER — VALSARTAN-HYDROCHLOROTHIAZIDE 320-25 MG PO TABS: 1.0000 | ORAL_TABLET | Freq: Every day | ORAL | 3 refills | Status: AC

## 2023-10-19 NOTE — Patient Instructions (Signed)
 Medication Instructions:  Your physician has recommended you make the following change in your medication:   START valsartan -hydrochlorothiazide     Labwork: Your physician recommends the following labs today: TSH, METANEPHRINES AND RENIN-ALDOSTERONE   Follow-Up: Follow up in 3 months in hypertension clinic  Special Instructions:

## 2023-10-19 NOTE — Progress Notes (Signed)
 [facility] Initial Assessment:    Date:  10/19/2023   ID:  Marissa Bowman, DOB 1966-07-26, MRN [id number]  PCP:  Daryl Setter, NP  Cardiologist:  None  Nephrologist:  Referring MD: Daryl Setter, NP   CC: Hypertension  History of Present Illness:    Marissa Bowman is a 58 y.o. female with a hx of hypertension, GERD, anemia, asthma here to establish care in the [facility].   Marissa Bowman was diagnosed with hypertension in her 26s or 30s. It has been difficult to control. Blood pressure checked with wrist cuff at home.  she reports tobacco use never. For exercise she has no formal routine but plans to resume walking. 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 she eats at home and outside of the home and does follow low sodium diet. No snoring nor daytime somnolence. Family history of hypertension in her mom and sister. At home HR 53-54 with no dizziness, lightheadedness. Taking medications routinely 6:30 AM and 6:30 PM. Taking Furosemide  with improvement in LE edema every other day.   Previous antihypertensives: Amlodipine - gum disease   Past Medical History:  Diagnosis Date   Anemia    on iron   Asthma    childhood   Hypertension     Past Surgical History:  Procedure Laterality Date   BIOPSY  10/12/2021   Procedure: BIOPSY;  Surgeon: Shila Gustav GAILS, MD;  Location: [facility];  Service: Endoscopy;;   COLONOSCOPY     COLONOSCOPY WITH PROPOFOL  N/A 02/03/2017   Procedure: COLONOSCOPY WITH PROPOFOL ;  Surgeon: Gustav Shila GAILS, MD;  Location: [facility];  Service: Endoscopy;  Laterality: N/A;   COLONOSCOPY WITH PROPOFOL  N/A 10/12/2021   Procedure: COLONOSCOPY WITH PROPOFOL ;  Surgeon: Shila Gustav GAILS, MD;  Location: [facility];  Service: Endoscopy;  Laterality: N/A;   ESOPHAGOGASTRODUODENOSCOPY N/A 04/12/2023   Procedure: ESOPHAGOGASTRODUODENOSCOPY (EGD);  Surgeon: Abran Norleen SAILOR, MD;   Location: [facility];  Service: Gastroenterology;  Laterality: N/A;   ESOPHAGOGASTRODUODENOSCOPY (EGD) WITH PROPOFOL  N/A 10/12/2021   Procedure: ESOPHAGOGASTRODUODENOSCOPY (EGD) WITH PROPOFOL ;  Surgeon: Shila Gustav GAILS, MD;  Location: [facility];  Service: Endoscopy;  Laterality: N/A;   NO PAST SURGERIES     POLYPECTOMY  10/12/2021   Procedure: POLYPECTOMY;  Surgeon: Shila Gustav GAILS, MD;  Location: [facility];  Service: Endoscopy;;    Current Medications: Current Meds  Medication Sig   carvedilol  (COREG ) 25 MG tablet TAKE 1 TABLET TWICE DAILY  WITH MEALS   cloNIDine  (CATAPRES ) 0.1 MG tablet Take 1 tablet (0.1 mg total) by mouth 2 (two) times daily.   furosemide  (LASIX ) 20 MG tablet Take 1 tablet by mouth once daily as needed for swelling   hydrochlorothiazide  (HYDRODIURIL ) 25 MG tablet Take 1 tablet (25 mg total) by mouth daily.   pantoprazole  (PROTONIX ) 40 MG tablet TAKE ONE TABLET BY MOUTH ONE TIME DAILY   potassium chloride  (KLOR-CON  M) 10 MEQ tablet Take 1 tablet (10 mEq total) by mouth 2 (two) times daily.   valsartan  (DIOVAN ) 320 MG tablet TAKE ONE TABLET BY MOUTH ONE TIME DAILY     Allergies:   Latex   Social History   Socioeconomic History   Marital status: Single    Spouse name: Not on file   Number of children: Not on file   Years of education: Not on file   Highest education level: Associate degree: occupational, scientist, product/process development, or vocational program  Occupational History  Not on file  Tobacco Use   Smoking status: Never   Smokeless tobacco: Never  Vaping Use   Vaping status: Never Used  Substance and Sexual Activity   Alcohol use: No   Drug use: No   Sexual activity: Not Currently    Birth control/protection: None  Other Topics Concern   Not on file  Social History Narrative   1 son- age 62   Unemployed, plans to sub with the [employer]   Associated degree   Lives with mother father/father and son   Has 1 dog lab/spaniel mix   Enjoys reading    Social Drivers of Health   Financial Resource Strain: High Risk (08/27/2023)   Overall Financial Resource Strain (CARDIA)    Difficulty of Paying Living Expenses: Hard  Food Insecurity: Food Insecurity Present (08/27/2023)   Hunger Vital Sign    Worried About Running Out of Food in the Last Year: Never true    Ran Out of Food in the Last Year: Sometimes true  Transportation Needs: No Transportation Needs (08/27/2023)   PRAPARE - Administrator, Civil Service (Medical): No    Lack of Transportation (Non-Medical): No  Physical Activity: Insufficiently Active (08/27/2023)   Exercise Vital Sign    Days of Exercise per Week: 4 days    Minutes of Exercise per Session: 30 min  Stress: No Stress Concern Present (08/27/2023)   [employer] of Occupational Health - Occupational Stress Questionnaire    Feeling of Stress : Not at all  Social Connections: Moderately Isolated (08/27/2023)   Social Connection and Isolation Panel [NHANES]    Frequency of Communication with Friends and Family: Three times a week    Frequency of Social Gatherings with Friends and Family: Twice a week    Attends Religious Services: Never    Database Administrator or Organizations: Yes    Attends Engineer, Structural: 1 to 4 times per year    Marital Status: Never married     Family History: The patient's family history includes Diabetes in her mother; Fibromyalgia in her sister; Hyperlipidemia in her mother; Hypertension in her mother; Lung cancer in her father; Thyroid disease in her sister. There is no history of Colon cancer.  ROS:   Please see the history of present illness.     All other systems reviewed and are negative.  EKGs/Labs/Other Studies Reviewed:         Recent Labs: 04/10/2023: B Natriuretic Peptide 26.5 04/12/2023: Magnesium 2.1 04/28/2023: ALT 15; Hemoglobin 9.5; Platelets 275 09/08/2023: BUN 21; Creat 1.07; Potassium 4.0; Sodium 140   Recent Lipid Panel     Component Value Date/Time   CHOL 201 (H) 09/20/2017 0824   TRIG 84.0 09/20/2017 0824   HDL 63.20 09/20/2017 0824   CHOLHDL 3 09/20/2017 0824   VLDL 16.8 09/20/2017 0824   LDLCALC 121 (H) 09/20/2017 0824    Physical Exam:   VS:  BP 133/84 (BP Location: Right Arm, Patient Position: Sitting, Cuff Size: Large)   Pulse (!) 55   Ht 5' 8 (1.727 m)   Wt 291 lb 11.2 oz (132.3 kg)   LMP 01/27/2017   SpO2 98%   BMI 44.35 kg/m  , BMI Body mass index is 44.35 kg/m. GENERAL:  Well appearing HEENT: Pupils equal round and reactive, fundi not visualized, oral mucosa unremarkable NECK:  No jugular venous distention, waveform within normal limits, carotid upstroke brisk and symmetric, no bruits, no thyromegaly LYMPHATICS:  No cervical adenopathy  LUNGS:  Clear to auscultation bilaterally HEART:  RRR.  PMI not displaced or sustained,S1 and S2 within normal limits, no S3, no S4, no clicks, no rubs, no murmurs ABD:  Flat, positive bowel sounds normal in frequency in pitch, no bruits, no rebound, no guarding, no midline pulsatile mass, no hepatomegaly, no splenomegaly EXT:  2 plus pulses throughout, no edema, no cyanosis no clubbing SKIN:  No rashes no nodules NEURO:  Cranial nerves II through XII grossly intact, motor grossly intact throughout PSYCH:  Cognitively intact, oriented to person place and time   ASSESSMENT/PLAN:    HTN - BP not at goal <130/80 despite multiple agents. For simplification, stop valsartan  and hydrochlorothiazide  and start Valsartan -hydrochlorothiazide  320-25mg  daily. Continue Coreg  25mg  BID, Clonidine  0.1mg  BID. Consider Spironolactone at follow up as may allow for discontinuation of potassium supplement and less frequent dosing of PRN Lasix .  Labs: TSH, metanephrines, renin-aldosterone  GERD - Continue to follow with PCP.   Bradycardia - asymptomatic with no lightheadedness, dizziness. Bradycardia likely related to Carvedilol , Clonidine .   Screening for Secondary  Hypertension:     Relevant Labs/Studies:    Latest Ref Rng & Units 09/08/2023    4:17 PM 06/20/2023   10:47 AM 06/05/2023   12:05 PM  Basic Labs  Sodium 135 - 146 mmol/L 140  141  142   Potassium 3.5 - 5.3 mmol/L 4.0  3.6  3.6   Creatinine 0.50 - 1.03 mg/dL 8.92  9.10  9.16        Latest Ref Rng & Units 09/20/2017    8:24 AM 01/11/2016   11:03 AM  Thyroid   TSH 0.35 - 4.50 uIU/mL 3.14  2.47                 06/29/2023    9:44 AM  Renovascular   Renal Artery US  Completed Yes       Disposition:    FU with MD/APP/PharmD in 3 months    Medication Adjustments/Labs and Tests Ordered: Current medicines are reviewed at length with the patient today.  Concerns regarding medicines are outlined above.  No orders of the defined types were placed in this encounter.  No orders of the defined types were placed in this encounter.    Signed, Reche GORMAN Finder, NP  10/19/2023 3:57 PM    [facility]

## 2023-10-22 ENCOUNTER — Encounter (HOSPITAL_BASED_OUTPATIENT_CLINIC_OR_DEPARTMENT_OTHER): Payer: Self-pay | Admitting: Family

## 2023-11-13 ENCOUNTER — Ambulatory Visit
Admission: RE | Admit: 2023-11-13 | Discharge: 2023-11-13 | Disposition: A | Discharge status: Home / Self Care | Payer: 59 | Source: Ambulatory Visit | Attending: Family | Admitting: Family

## 2023-11-13 DIAGNOSIS — Z1231 Encounter for screening mammogram for malignant neoplasm of breast: Secondary | ICD-10-CM

## 2023-11-23 ENCOUNTER — Other Ambulatory Visit: Payer: Self-pay | Admitting: Family

## 2023-11-23 DIAGNOSIS — I10 Essential (primary) hypertension: Secondary | ICD-10-CM

## 2023-11-26 ENCOUNTER — Other Ambulatory Visit: Payer: Self-pay | Admitting: Family

## 2023-11-26 DIAGNOSIS — I10 Essential (primary) hypertension: Secondary | ICD-10-CM

## 2023-12-06 ENCOUNTER — Ambulatory Visit: Payer: 59 | Admitting: Physician Assistant

## 2023-12-08 ENCOUNTER — Ambulatory Visit: Payer: 59 | Admitting: Family

## 2023-12-08 VITALS — BP 136/50 | HR 60 | Temp 98.6°F | Resp 16 | Ht 68.0 in | Wt 303.0 lb

## 2023-12-08 DIAGNOSIS — D509 Iron deficiency anemia, unspecified: Secondary | ICD-10-CM | POA: Diagnosis not present

## 2023-12-08 DIAGNOSIS — I1A0 Resistant hypertension: Secondary | ICD-10-CM | POA: Diagnosis not present

## 2023-12-08 DIAGNOSIS — K219 Gastro-esophageal reflux disease without esophagitis: Secondary | ICD-10-CM

## 2023-12-08 MED ORDER — PANTOPRAZOLE SODIUM 40 MG PO TBEC: 40.0000 mg | DELAYED_RELEASE_TABLET | Freq: Every day | ORAL | 1 refills | Status: DC

## 2023-12-08 NOTE — Assessment & Plan Note (Signed)
Maintained on pantoprazole-

## 2023-12-08 NOTE — Assessment & Plan Note (Signed)
 Continues iron supplement. Has follow up with GI (hx of gastric erosions). Update CBC.

## 2023-12-08 NOTE — Progress Notes (Signed)
 Subjective:     Patient ID: Marissa Bowman, female    DOB: November 10, 1965, 58 y.o.   MRN: [id number]  Chief Complaint  Patient presents with   Hypertension    Here for follow up    Hypertension    Discussed the use of AI scribe software for clinical note transcription with the patient, who gave verbal consent to proceed.  History of Present Illness   Marissa Bowman is a 58 year old female with hypertension who presents for a regular follow-up visit.  Hypertension is well-controlled with her current medication regimen, which includes carvedilol, clonidine, and Diovan HCT. She uses Lasix as needed but notes infrequent use. Blood pressure readings have been stable.  She occasionally experiences dark or bloody stools and is currently taking iron supplements twice a day. She is also on pantoprazole for acid reflux, which she reports is better than usual. She had an appointment with a gastroenterologist that was rescheduled, and she plans to attend next month.  She requested a refill for her pantoprazole, noting that she had no more refills available.         There are no preventive care reminders to display for this patient.  Past Medical History:  Diagnosis Date   Anemia    on iron   Asthma    childhood   Hypertension     Past Surgical History:  Procedure Laterality Date   BIOPSY  10/12/2021   Procedure: BIOPSY;  Surgeon: Napoleon Form, MD;  Location: [facility];  Service: Endoscopy;;   COLONOSCOPY     COLONOSCOPY WITH PROPOFOL N/A 02/03/2017   Procedure: COLONOSCOPY WITH PROPOFOL;  Surgeon: Napoleon Form, MD;  Location: [facility];  Service: Endoscopy;  Laterality: N/A;   COLONOSCOPY WITH PROPOFOL N/A 10/12/2021   Procedure: COLONOSCOPY WITH PROPOFOL;  Surgeon: Napoleon Form, MD;  Location: [facility];  Service: Endoscopy;  Laterality: N/A;   ESOPHAGOGASTRODUODENOSCOPY N/A 04/12/2023   Procedure: ESOPHAGOGASTRODUODENOSCOPY (EGD);  Surgeon: Hilarie Fredrickson, MD;  Location: [facility];  Service: Gastroenterology;  Laterality: N/A;   ESOPHAGOGASTRODUODENOSCOPY (EGD) WITH PROPOFOL N/A 10/12/2021   Procedure: ESOPHAGOGASTRODUODENOSCOPY (EGD) WITH PROPOFOL;  Surgeon: Napoleon Form, MD;  Location: [facility];  Service: Endoscopy;  Laterality: N/A;   NO PAST SURGERIES     POLYPECTOMY  10/12/2021   Procedure: POLYPECTOMY;  Surgeon: Napoleon Form, MD;  Location: [facility];  Service: Endoscopy;;    Family History  Problem Relation Age of Onset   Hypertension Mother    Hyperlipidemia Mother    Diabetes Mother    Lung cancer Father    Thyroid disease Sister    Fibromyalgia Sister    Colon cancer Neg Hx    BRCA 1/2 Neg Hx    Breast cancer Neg Hx     Social History   Socioeconomic History   Marital status: Single    Spouse name: Not on file   Number of children: Not on file   Years of education: Not on file   Highest education level: Associate degree: occupational, Scientist, product/process development, or vocational program  Occupational History   Not on file  Tobacco Use   Smoking status: Never   Smokeless tobacco: Never  Vaping Use   Vaping status: Never Used  Substance and Sexual Activity   Alcohol use: No   Drug use: No   Sexual activity: Not Currently    Birth control/protection: None  Other Topics Concern   Not on file  Social History Narrative  1 son- age 19   Unemployed, plans to sub with the [employer]   Associated degree   Lives with mother father/father and son   Has 1 dog lab/spaniel mix   Enjoys reading   Social Drivers of Health   Financial Resource Strain: Low Risk  (12/01/2023)   Overall Financial Resource Strain (CARDIA)    Difficulty of Paying Living Expenses: Not very hard  Food Insecurity: No Food Insecurity (12/01/2023)   Hunger Vital Sign    Worried About Running Out of Food in the Last Year: Never true    Ran Out of Food in the Last Year: Never true  Transportation Needs: No Transportation Needs  (12/01/2023)   PRAPARE - Administrator, Civil Service (Medical): No    Lack of Transportation (Non-Medical): No  Physical Activity: Insufficiently Active (12/01/2023)   Exercise Vital Sign    Days of Exercise per Week: 3 days    Minutes of Exercise per Session: 30 min  Stress: No Stress Concern Present (12/01/2023)   [employer] of Occupational Health - Occupational Stress Questionnaire    Feeling of Stress : Not at all  Social Connections: Moderately Integrated (12/01/2023)   Social Connection and Isolation Panel [NHANES]    Frequency of Communication with Friends and Family: Twice a week    Frequency of Social Gatherings with Friends and Family: Once a week    Attends Religious Services: 1 to 4 times per year    Active Member of [employer] or Organizations: Yes    Attends Banker Meetings: 1 to 4 times per year    Marital Status: Never married  Intimate Partner Violence: Not At Risk (04/10/2023)   Humiliation, Afraid, Rape, and Kick questionnaire    Fear of Current or Ex-Partner: No    Emotionally Abused: No    Physically Abused: No    Sexually Abused: No    Outpatient Medications Prior to Visit  Medication Sig Dispense Refill   carvedilol (COREG) 25 MG tablet TAKE 1 TABLET TWICE DAILY  WITH MEALS 180 tablet 1   cloNIDine (CATAPRES) 0.1 MG tablet Take 1 tablet (0.1 mg total) by mouth 2 (two) times daily. 90 tablet 3   furosemide (LASIX) 20 MG tablet Take 1 tablet by mouth once daily as needed for swelling 30 tablet 0   potassium chloride (KLOR-CON M) 10 MEQ tablet Take 1 tablet (10 mEq total) by mouth 2 (two) times daily. 180 tablet 0   valsartan-hydrochlorothiazide (DIOVAN-HCT) 320-25 MG tablet Take 1 tablet by mouth daily. 90 tablet 3   pantoprazole (PROTONIX) 40 MG tablet TAKE ONE TABLET BY MOUTH ONE TIME DAILY 90 tablet 0   ferrous sulfate 325 (65 FE) MG tablet Take 1 tablet (325 mg total) by mouth 2 (two) times daily with a meal. 60 tablet 2   No  facility-administered medications prior to visit.    Allergies  Allergen Reactions   Latex Rash    Latex gloves with powder cause a rash    ROS See HPI    Objective:    Physical Exam Constitutional:      General: She is not in acute distress.    Appearance: Normal appearance. She is well-developed.  HENT:     Head: Normocephalic and atraumatic.     Right Ear: External ear normal.     Left Ear: External ear normal.  Eyes:     General: No scleral icterus. Neck:     Thyroid: No thyromegaly.  Cardiovascular:  Rate and Rhythm: Normal rate and regular rhythm.     Heart sounds: Normal heart sounds. No murmur heard. Pulmonary:     Effort: Pulmonary effort is normal. No respiratory distress.     Breath sounds: Normal breath sounds. No wheezing.  Musculoskeletal:     Cervical back: Neck supple.  Skin:    General: Skin is warm and dry.  Neurological:     Mental Status: She is alert and oriented to person, place, and time.  Psychiatric:        Mood and Affect: Mood normal.        Behavior: Behavior normal.        Thought Content: Thought content normal.        Judgment: Judgment normal.      BP (!) 136/50 (BP Location: Right Arm, Patient Position: Sitting, Cuff Size: Large)   Pulse 60   Temp 98.6 F (37 C) (Oral)   Resp 16   Ht 5\' 8"  (1.727 m)   Wt (!) 303 lb (137.4 kg)   LMP 01/27/2017   SpO2 100%   BMI 46.07 kg/m  Wt Readings from Last 3 Encounters:  12/08/23 (!) 303 lb (137.4 kg)  10/19/23 291 lb 11.2 oz (132.3 kg)  09/08/23 289 lb (131.1 kg)       Assessment & Plan:   Problem List Items Addressed This Visit       Unprioritized   HTN (hypertension) - Primary   BP Readings from Last 3 Encounters:  12/08/23 (!) 136/50  10/19/23 133/84  09/08/23 (!) 135/92   Stable on carvedilol, diovan hct, and clonidine.       Relevant Orders   Basic Metabolic Panel (BMET)   GERD (gastroesophageal reflux disease)   Maintained on pantoprazole.         Relevant Medications   pantoprazole (PROTONIX) 40 MG tablet   Anemia, iron deficiency   Continues iron supplement. Has follow up with GI (hx of gastric erosions). Update CBC.      Relevant Orders   Iron, TIBC and Ferritin Panel   CBC w/Diff    I have changed Pattricia Boss D. Grundman's pantoprazole. I am also having her maintain her ferrous sulfate, furosemide, cloNIDine, carvedilol, valsartan-hydrochlorothiazide, and potassium chloride.  Meds ordered this encounter  Medications   pantoprazole (PROTONIX) 40 MG tablet    Sig: Take 1 tablet (40 mg total) by mouth daily.    Dispense:  90 tablet    Refill:  1    Supervising Provider:   Danise Edge A [[id number]]

## 2023-12-08 NOTE — Assessment & Plan Note (Signed)
 BP Readings from Last 3 Encounters:  12/08/23 (!) 136/50  10/19/23 133/84  09/08/23 (!) 135/92   Stable on carvedilol, diovan hct, and clonidine.

## 2023-12-08 NOTE — Patient Instructions (Signed)
 VISIT SUMMARY:  Today, you had a follow-up visit to review your hypertension, acid reflux, and iron deficiency anemia. Your blood pressure is well-controlled with your current medications. You mentioned occasional dark stools and are taking iron supplements. Your acid reflux symptoms have improved with pantoprazole, and you requested a refill. We also discussed your upcoming gastroenterologist appointment and general health maintenance.  YOUR PLAN:  -HYPERTENSION: Hypertension, or high blood pressure, is well-controlled with your current medications: Carvedilol, Clonidine, and Diovan HCT. You use Lasix infrequently as needed. Continue taking your medications as prescribed.  -GASTROESOPHAGEAL REFLUX DISEASE (GERD): GERD is a condition where stomach acid frequently flows back into the tube connecting your mouth and stomach. Your symptoms have improved with pantoprazole. We have refilled your pantoprazole prescription and sent it to [facility]. Please continue taking it as directed and attend your rescheduled gastroenterologist appointment next month.  -IRON DEFICIENCY ANEMIA: Iron deficiency anemia is a condition where you lack enough healthy red blood cells due to insufficient iron. You reported occasional dark stools and are taking iron supplements twice daily. We will check your blood count and iron levels today.  -GENERAL HEALTH MAINTENANCE: We will check your kidney function today and plan to transition all your prescriptions to [facility] as you requested. Follow up in six months for your next routine visit.  INSTRUCTIONS:  Please follow up in six months for your next routine visit. Attend your rescheduled gastroenterologist appointment next month. Continue taking your medications as prescribed and get your blood count and iron levels checked today.

## 2023-12-09 ENCOUNTER — Encounter: Payer: Self-pay | Admitting: Family

## 2023-12-09 LAB — CBC WITH DIFFERENTIAL/PLATELET
Absolute Lymphocytes: 1348 {cells}/uL (ref 850–3900)
Absolute Monocytes: 441 {cells}/uL (ref 200–950)
Basophils Absolute: 19 {cells}/uL (ref 0–200)
Basophils Relative: 0.3 %
Eosinophils Absolute: 189 {cells}/uL (ref 15–500)
Eosinophils Relative: 3 %
HCT: 31.8 % — ABNORMAL LOW (ref 35.0–45.0)
Hemoglobin: 10 g/dL — ABNORMAL LOW (ref 11.7–15.5)
MCH: 25.9 pg — ABNORMAL LOW (ref 27.0–33.0)
MCHC: 31.4 g/dL — ABNORMAL LOW (ref 32.0–36.0)
MCV: 82.4 fL (ref 80.0–100.0)
MPV: 10.6 fL (ref 7.5–12.5)
Monocytes Relative: 7 %
Neutro Abs: 4303 {cells}/uL (ref 1500–7800)
Neutrophils Relative %: 68.3 %
Platelets: 284 10*3/uL (ref 140–400)
RBC: 3.86 10*6/uL (ref 3.80–5.10)
RDW: 14.9 % (ref 11.0–15.0)
Total Lymphocyte: 21.4 %
WBC: 6.3 10*3/uL (ref 3.8–10.8)

## 2023-12-09 LAB — BASIC METABOLIC PANEL
BUN/Creatinine Ratio: 25 (calc) — ABNORMAL HIGH (ref 6–22)
BUN: 27 mg/dL — ABNORMAL HIGH (ref 7–25)
CO2: 27 mmol/L (ref 20–32)
Calcium: 9 mg/dL (ref 8.6–10.4)
Chloride: 100 mmol/L (ref 98–110)
Creat: 1.07 mg/dL — ABNORMAL HIGH (ref 0.50–1.05)
Glucose, Bld: 84 mg/dL (ref 65–99)
Potassium: 4.4 mmol/L (ref 3.5–5.3)
Sodium: 139 mmol/L (ref 135–146)

## 2023-12-09 LAB — IRON,TIBC AND FERRITIN PANEL
%SAT: 15 % — ABNORMAL LOW (ref 16–45)
Ferritin: 437 ng/mL — ABNORMAL HIGH (ref 16–232)
Iron: 36 ug/dL — ABNORMAL LOW (ref 45–160)
TIBC: 233 ug/dL — ABNORMAL LOW (ref 250–450)

## 2023-12-11 ENCOUNTER — Encounter: Payer: Self-pay | Admitting: Family

## 2023-12-11 ENCOUNTER — Other Ambulatory Visit: Payer: Self-pay | Admitting: Family

## 2023-12-11 DIAGNOSIS — I10 Essential (primary) hypertension: Secondary | ICD-10-CM

## 2023-12-11 DIAGNOSIS — B351 Tinea unguium: Secondary | ICD-10-CM

## 2024-01-16 ENCOUNTER — Ambulatory Visit: Payer: 59 | Admitting: Physician Assistant

## 2024-01-22 ENCOUNTER — Encounter (HOSPITAL_BASED_OUTPATIENT_CLINIC_OR_DEPARTMENT_OTHER): Payer: BC Managed Care – PPO | Admitting: Family

## 2024-02-01 ENCOUNTER — Encounter: Payer: Self-pay | Admitting: Medical

## 2024-02-01 ENCOUNTER — Ambulatory Visit (INDEPENDENT_AMBULATORY_CARE_PROVIDER_SITE_OTHER): Admitting: Medical

## 2024-02-01 VITALS — BP 116/64 | HR 55 | Temp 97.6°F | Resp 18 | Ht 68.0 in | Wt 290.0 lb

## 2024-02-01 DIAGNOSIS — J019 Acute sinusitis, unspecified: Secondary | ICD-10-CM | POA: Diagnosis not present

## 2024-02-01 DIAGNOSIS — R059 Cough, unspecified: Secondary | ICD-10-CM | POA: Diagnosis not present

## 2024-02-01 DIAGNOSIS — R062 Wheezing: Secondary | ICD-10-CM | POA: Diagnosis not present

## 2024-02-01 DIAGNOSIS — J301 Allergic rhinitis due to pollen: Secondary | ICD-10-CM | POA: Diagnosis not present

## 2024-02-01 LAB — POCT INFLUENZA A/B
Influenza A, POC: NEGATIVE
Influenza B, POC: NEGATIVE

## 2024-02-01 LAB — POC COVID19 BINAXNOW: SARS Coronavirus 2 Ag: NEGATIVE

## 2024-02-01 MED ORDER — BENZONATATE 100 MG PO CAPS: 100.0000 mg | ORAL_CAPSULE | Freq: Three times a day (TID) | ORAL | 0 refills | Status: DC | PRN

## 2024-02-01 MED ORDER — AZITHROMYCIN 250 MG PO TABS
ORAL_TABLET | ORAL | 0 refills | Status: AC
Start: 2024-02-01 — End: 2024-02-06

## 2024-02-01 MED ORDER — LEVOCETIRIZINE DIHYDROCHLORIDE 5 MG PO TABS: 5.0000 mg | ORAL_TABLET | Freq: Every evening | ORAL | 1 refills | Status: DC

## 2024-02-01 MED ORDER — FLUTICASONE PROPIONATE 50 MCG/ACT NA SUSP
2.0000 | Freq: Every day | NASAL | 1 refills | Status: DC
Start: 2024-02-01 — End: 2024-06-07

## 2024-02-01 MED ORDER — ALBUTEROL SULFATE HFA 108 (90 BASE) MCG/ACT IN AERS: 2.0000 | INHALATION_SPRAY | Freq: Four times a day (QID) | RESPIRATORY_TRACT | 0 refills | Status: DC | PRN

## 2024-02-01 NOTE — Patient Instructions (Addendum)
 Allergic rhinitis with early onset maxillary sinusitis(approaching 3 day weekend) Symptoms consistent with allergic rhinitis, likely due to pollen exposure. Possible sinusitis indicated by maxillary sinus pain on papation and chills. Negative flu and covid swab tests. - Prescribed Xyzal (levocetirizine) 5 mg, one tablet at night. - Prescribed Flonase (fluticasone) nasal spray, two sprays in each nostril once daily. - Prescribed a five-day course of azithromycin antibiotics for potential sinus infection.  Cough - Prescribed benzonate to use every eight hours as needed for cough.  Reactive airway symptoms Nocturnal wheezing suggestive of reactive airway disease possibly triggered by current illness. No asthma history. - Prescribed an albuterol inhaler for use as needed for dyspnea or wheezing.  Follow up 7-10 days or sooner if needed

## 2024-02-01 NOTE — Progress Notes (Signed)
 Subjective:    Patient ID: Marissa Bowman, female    DOB: 10-13-1966, 58 y.o.   MRN: [id number]  HPI  Marissa Bowman is a 58 year old female with a history of seasonal allergies who presents with nasal congestion and cough.  Her symptoms began last week with a runny nose, initially attributed to her known seasonal allergies. During a spring break trip, she developed a cough and congestion, which have persisted since then.  She describes her nasal congestion as accompanied by sneezing, itchy eyes, and a runny nose. She also reports frontal sinus pressure and some maxillary pressure as well.. No recent sinus infections, although she has experienced them occasionally in past years.  She experiences chills but no fever, body aches, or sweats. She reports wheezing that began with the onset of her cough, occurring mainly at night, although she denies a history of asthma.  She has been taking DayQuil with honey to manage her symptoms.    Review of Systems  Constitutional:  Positive for chills. Negative for fatigue and fever.  HENT:  Positive for congestion.   Respiratory:  Positive for cough and wheezing.   Cardiovascular:  Negative for chest pain and palpitations.  Gastrointestinal:  Negative for abdominal pain.  Genitourinary:  Negative for dysuria.  Musculoskeletal:  Negative for back pain, myalgias and neck stiffness.  Neurological:  Negative for dizziness, syncope and numbness.  Hematological:  Negative for adenopathy. Does not bruise/bleed easily.  Psychiatric/Behavioral:  Negative for behavioral problems, decreased concentration and dysphoric mood.     Past Medical History:  Diagnosis Date   Anemia    on iron   Asthma    childhood   Hypertension      Social History   Socioeconomic History   Marital status: Single    Spouse name: Not on file   Number of children: Not on file   Years of education: Not on file   Highest education level: Associate degree: occupational,  Scientist, product/process development, or vocational program  Occupational History   Not on file  Tobacco Use   Smoking status: Never   Smokeless tobacco: Never  Vaping Use   Vaping status: Never Used  Substance and Sexual Activity   Alcohol use: No   Drug use: No   Sexual activity: Not Currently    Birth control/protection: None  Other Topics Concern   Not on file  Social History Narrative   1 son- age 70   Unemployed, plans to sub with the [employer]   Associated degree   Lives with mother father/father and son   Has 1 dog lab/spaniel mix   Enjoys reading   Social Drivers of Health   Financial Resource Strain: Low Risk  (12/01/2023)   Overall Financial Resource Strain (CARDIA)    Difficulty of Paying Living Expenses: Not very hard  Food Insecurity: No Food Insecurity (12/01/2023)   Hunger Vital Sign    Worried About Running Out of Food in the Last Year: Never true    Ran Out of Food in the Last Year: Never true  Transportation Needs: No Transportation Needs (12/01/2023)   PRAPARE - Administrator, Civil Service (Medical): No    Lack of Transportation (Non-Medical): No  Physical Activity: Insufficiently Active (12/01/2023)   Exercise Vital Sign    Days of Exercise per Week: 3 days    Minutes of Exercise per Session: 30 min  Stress: No Stress Concern Present (12/01/2023)   [employer] of Occupational Health -  Occupational Stress Questionnaire    Feeling of Stress : Not at all  Social Connections: Moderately Integrated (12/01/2023)   Social Connection and Isolation Panel [NHANES]    Frequency of Communication with Friends and Family: Twice a week    Frequency of Social Gatherings with Friends and Family: Once a week    Attends Religious Services: 1 to 4 times per year    Active Member of [employer] or Organizations: Yes    Attends Banker Meetings: 1 to 4 times per year    Marital Status: Never married  Intimate Partner Violence: Not At Risk (04/10/2023)   Humiliation,  Afraid, Rape, and Kick questionnaire    Fear of Current or Ex-Partner: No    Emotionally Abused: No    Physically Abused: No    Sexually Abused: No    Past Surgical History:  Procedure Laterality Date   BIOPSY  10/12/2021   Procedure: BIOPSY;  Surgeon: Napoleon Form, MD;  Location: [facility];  Service: Endoscopy;;   COLONOSCOPY     COLONOSCOPY WITH PROPOFOL N/A 02/03/2017   Procedure: COLONOSCOPY WITH PROPOFOL;  Surgeon: Napoleon Form, MD;  Location: [facility];  Service: Endoscopy;  Laterality: N/A;   COLONOSCOPY WITH PROPOFOL N/A 10/12/2021   Procedure: COLONOSCOPY WITH PROPOFOL;  Surgeon: Napoleon Form, MD;  Location: [facility];  Service: Endoscopy;  Laterality: N/A;   ESOPHAGOGASTRODUODENOSCOPY N/A 04/12/2023   Procedure: ESOPHAGOGASTRODUODENOSCOPY (EGD);  Surgeon: Hilarie Fredrickson, MD;  Location: [facility];  Service: Gastroenterology;  Laterality: N/A;   ESOPHAGOGASTRODUODENOSCOPY (EGD) WITH PROPOFOL N/A 10/12/2021   Procedure: ESOPHAGOGASTRODUODENOSCOPY (EGD) WITH PROPOFOL;  Surgeon: Napoleon Form, MD;  Location: [facility];  Service: Endoscopy;  Laterality: N/A;   NO PAST SURGERIES     POLYPECTOMY  10/12/2021   Procedure: POLYPECTOMY;  Surgeon: Napoleon Form, MD;  Location: [facility];  Service: Endoscopy;;    Family History  Problem Relation Age of Onset   Hypertension Mother    Hyperlipidemia Mother    Diabetes Mother    Lung cancer Father    Thyroid disease Sister    Fibromyalgia Sister    Colon cancer Neg Hx    BRCA 1/2 Neg Hx    Breast cancer Neg Hx     Allergies  Allergen Reactions   Latex Rash    Latex gloves with powder cause a rash    Current Outpatient Medications on File Prior to Visit  Medication Sig Dispense Refill   carvedilol (COREG) 25 MG tablet TAKE 1 TABLET TWICE DAILY  WITH MEALS 180 tablet 1   cloNIDine (CATAPRES) 0.1 MG tablet TAKE ONE TABLET BY MOUTH TWICE A DAY 180 tablet 1   ferrous sulfate 325 (65  FE) MG tablet Take 1 tablet (325 mg total) by mouth 2 (two) times daily with a meal. 60 tablet 2   furosemide (LASIX) 20 MG tablet Take 1 tablet by mouth once daily as needed for swelling 30 tablet 0   pantoprazole (PROTONIX) 40 MG tablet Take 1 tablet (40 mg total) by mouth daily. 90 tablet 1   potassium chloride (KLOR-CON M) 10 MEQ tablet Take 1 tablet (10 mEq total) by mouth 2 (two) times daily. 180 tablet 0   valsartan-hydrochlorothiazide (DIOVAN-HCT) 320-25 MG tablet Take 1 tablet by mouth daily. 90 tablet 3   No current facility-administered medications on file prior to visit.    BP 116/64   Pulse (!) 55   Temp 97.6 F (36.4 C)   Resp 18  Ht 5\' 8"  (1.727 m)   Wt 290 lb (131.5 kg)   LMP 01/27/2017   SpO2 98%   BMI 44.09 kg/m          Objective:   Physical Exam  General- No acute distress. Pleasant patient. Neck- Full range of motion, no jvd Lungs- Clear, even and unlabored. Heart- regular rate and rhythm. Neurologic- CNII- XII grossly intact.  Heent- maxillary sinus pressure. Normal canals and tm bilaterally. Boggy turbinates. +pnd. No tonsil hypertrophy.       Assessment & Plan:   Allergic rhinitis with early onset maxillary sinusitis(approaching 3 day weekend) Symptoms consistent with allergic rhinitis, likely due to pollen exposure. Possible sinusitis indicated by maxillary sinus pain on papation and chills. Negative flu and covid swab tests. - Prescribed Xyzal (levocetirizine) 5 mg, one tablet at night. - Prescribed Flonase (fluticasone) nasal spray, two sprays in each nostril once daily. - Prescribed a five-day course of azithromycin antibiotics for potential sinus infection.  Cough  - Prescribed benzonate to use every eight hours as needed for cough.  Reactive airway symptoms Nocturnal wheezing suggestive of reactive airway disease possibly triggered by current illness. No asthma history. - Prescribed an albuterol inhaler for use as needed for dyspnea or  wheezing.  Follow up 7-10 days or sooner if needed

## 2024-02-05 ENCOUNTER — Other Ambulatory Visit: Payer: Self-pay | Admitting: Family

## 2024-02-08 ENCOUNTER — Encounter (HOSPITAL_BASED_OUTPATIENT_CLINIC_OR_DEPARTMENT_OTHER): Payer: Self-pay

## 2024-02-08 ENCOUNTER — Ambulatory Visit (HOSPITAL_BASED_OUTPATIENT_CLINIC_OR_DEPARTMENT_OTHER): Admitting: Family

## 2024-02-08 ENCOUNTER — Encounter (HOSPITAL_BASED_OUTPATIENT_CLINIC_OR_DEPARTMENT_OTHER): Payer: Self-pay | Admitting: Family

## 2024-02-08 VITALS — BP 116/70 | HR 55 | Ht 62.0 in | Wt 285.0 lb

## 2024-02-08 DIAGNOSIS — K219 Gastro-esophageal reflux disease without esophagitis: Secondary | ICD-10-CM

## 2024-02-08 DIAGNOSIS — J019 Acute sinusitis, unspecified: Secondary | ICD-10-CM | POA: Diagnosis not present

## 2024-02-08 DIAGNOSIS — I10 Essential (primary) hypertension: Secondary | ICD-10-CM

## 2024-02-08 NOTE — Progress Notes (Signed)
 [facility] Assessment:    Date:  02/08/2024   ID:  Marissa Bowman, DOB 02-23-1966, MRN [id number]  PCP:  Dorrene Gaucher, NP  Cardiologist:  None  Nephrologist:  Referring MD: Dorrene Gaucher, NP   CC: Hypertension  History of Present Illness:    Marissa Bowman is a 58 y.o. female with a hx of hypertension, GERD, anemia, asthma here to follow up in the [facility].   Established with [facility] 10/19/23. Marissa Bowman was diagnosed with hypertension in her 5s or 30s. It has been difficult to control.   At visit 10/19/23 regimen consolidated to Valsartan -hydrochlorothiazide  320-25mg  daily. Coreg  25mg  BID, Clonidine  0.1mg  BID were continued.   Presents today for follow up. 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 Completed course of Azithromycin  earlier this week. Still with persistent nonproductive cough, some sinus congestion. Weight loss of 6 lbs since last clinic visit predominantly through dietary changes. Requiring her PRN Lasix  for LE edema rarely. Occasional lightheadedness but no near syncope, syncope. Lightheadedness last week in setting of poor PO intake related to illness. No chest pain, exertional dyspnea.   Previous antihypertensives: Amlodipine - gum disease   Past Medical History:  Diagnosis Date   Anemia    on iron   Asthma    childhood   Hypertension     Past Surgical History:  Procedure Laterality Date   BIOPSY  10/12/2021   Procedure: BIOPSY;  Surgeon: Sergio Dandy, MD;  Location: [facility];  Service: Endoscopy;;   COLONOSCOPY     COLONOSCOPY WITH PROPOFOL  N/A 02/03/2017   Procedure: COLONOSCOPY WITH PROPOFOL ;  Surgeon: Sergio Dandy, MD;  Location: [facility];  Service: Endoscopy;  Laterality: N/A;   COLONOSCOPY WITH PROPOFOL  N/A 10/12/2021   Procedure: COLONOSCOPY WITH  PROPOFOL ;  Surgeon: Sergio Dandy, MD;  Location: [facility];  Service: Endoscopy;  Laterality: N/A;   ESOPHAGOGASTRODUODENOSCOPY N/A 04/12/2023   Procedure: ESOPHAGOGASTRODUODENOSCOPY (EGD);  Surgeon: Tobin Forts, MD;  Location: [facility];  Service: Gastroenterology;  Laterality: N/A;   ESOPHAGOGASTRODUODENOSCOPY (EGD) WITH PROPOFOL  N/A 10/12/2021   Procedure: ESOPHAGOGASTRODUODENOSCOPY (EGD) WITH PROPOFOL ;  Surgeon: Sergio Dandy, MD;  Location: [facility];  Service: Endoscopy;  Laterality: N/A;   NO PAST SURGERIES     POLYPECTOMY  10/12/2021   Procedure: POLYPECTOMY;  Surgeon: Sergio Dandy, MD;  Location: [facility];  Service: Endoscopy;;    Current Medications: Current Meds  Medication Sig   albuterol  (VENTOLIN  HFA) 108 (90 Base) MCG/ACT inhaler Inhale 2 puffs into the lungs every 6 (six) hours as needed for wheezing or shortness of breath.   benzonatate  (TESSALON ) 100 MG capsule Take 1 capsule (100 mg total) by mouth 3 (three) times daily as needed for cough.   carvedilol  (COREG ) 25 MG tablet TAKE 1 TABLET TWICE DAILY  WITH MEALS   carvedilol  (COREG ) 6.25 MG tablet TAKE ONE TABLET BY MOUTH TWICE A DAY WITH A MEAL   cloNIDine  (CATAPRES ) 0.1 MG tablet TAKE ONE TABLET BY MOUTH TWICE A DAY   fluticasone  (FLONASE ) 50 MCG/ACT nasal spray Place 2 sprays into both nostrils daily.   levocetirizine (XYZAL ) 5 MG tablet Take 1 tablet (5 mg total) by mouth every evening.   pantoprazole  (PROTONIX ) 40 MG tablet Take 1 tablet (40 mg total) by mouth daily.   potassium chloride  (KLOR-CON  M) 10 MEQ tablet Take 1 tablet (  10 mEq total) by mouth 2 (two) times daily.   valsartan -hydrochlorothiazide  (DIOVAN -HCT) 320-25 MG tablet Take 1 tablet by mouth daily.     Allergies:   Latex   Social History   Socioeconomic History   Marital status: Single    Spouse name: Not on file   Number of children: Not on file   Years of education: Not on file   Highest education level:  Associate degree: occupational, Scientist, product/process development, or vocational program  Occupational History   Not on file  Tobacco Use   Smoking status: Never   Smokeless tobacco: Never  Vaping Use   Vaping status: Never Used  Substance and Sexual Activity   Alcohol use: No   Drug use: No   Sexual activity: Not Currently    Birth control/protection: None  Other Topics Concern   Not on file  Social History Narrative   1 son- age 60   Unemployed, plans to sub with the [employer]   Associated degree   Lives with mother father/father and son   Has 1 dog lab/spaniel mix   Enjoys reading   Social Drivers of Health   Financial Resource Strain: Low Risk  (12/01/2023)   Overall Financial Resource Strain (CARDIA)    Difficulty of Paying Living Expenses: Not very hard  Food Insecurity: No Food Insecurity (12/01/2023)   Hunger Vital Sign    Worried About Running Out of Food in the Last Year: Never true    Ran Out of Food in the Last Year: Never true  Transportation Needs: No Transportation Needs (12/01/2023)   PRAPARE - Administrator, Civil Service (Medical): No    Lack of Transportation (Non-Medical): No  Physical Activity: Insufficiently Active (12/01/2023)   Exercise Vital Sign    Days of Exercise per Week: 3 days    Minutes of Exercise per Session: 30 min  Stress: No Stress Concern Present (12/01/2023)   [employer] of Occupational Health - Occupational Stress Questionnaire    Feeling of Stress : Not at all  Social Connections: Moderately Integrated (12/01/2023)   Social Connection and Isolation Panel [NHANES]    Frequency of Communication with Friends and Family: Twice a week    Frequency of Social Gatherings with Friends and Family: Once a week    Attends Religious Services: 1 to 4 times per year    Active Member of [employer] or Organizations: Yes    Attends Banker Meetings: 1 to 4 times per year    Marital Status: Never married     Family History: The patient's  family history includes Diabetes in her mother; Fibromyalgia in her sister; Hyperlipidemia in her mother; Hypertension in her mother; Lung cancer in her father; Thyroid disease in her sister. There is no history of Colon cancer, BRCA 1/2, or Breast cancer.  ROS:   Please see the history of present illness.     All other systems reviewed and are negative.  EKGs/Labs/Other Studies Reviewed:         Recent Labs: 04/10/2023: B Natriuretic Peptide 26.5 04/12/2023: Magnesium 2.1 04/28/2023: ALT 15 12/08/2023: BUN 27; Creat 1.07; Hemoglobin 10.0; Platelets 284; Potassium 4.4; Sodium 139   Recent Lipid Panel    Component Value Date/Time   CHOL 201 (H) 09/20/2017 0824   TRIG 84.0 09/20/2017 0824   HDL 63.20 09/20/2017 0824   CHOLHDL 3 09/20/2017 0824   VLDL 16.8 09/20/2017 0824   LDLCALC 121 (H) 09/20/2017 0824    Physical Exam:   VS:  BP 116/70 (BP Location: Left Arm, Patient Position: Sitting, Cuff Size: Large)   Pulse (!) 55   Ht 5\' 2"  (1.575 m)   Wt 285 lb (129.3 kg)   LMP 01/27/2017   BMI 52.13 kg/m  , BMI Body mass index is 52.13 kg/m. GENERAL:  Well appearing HEENT: Pupils equal round and reactive, fundi not visualized, oral mucosa unremarkable NECK:  No jugular venous distention, waveform within normal limits, carotid upstroke brisk and symmetric, no bruits, no thyromegaly LYMPHATICS:  No cervical adenopathy LUNGS:  Clear to auscultation bilaterally HEART:  RRR.  PMI not displaced or sustained,S1 and S2 within normal limits, no S3, no S4, no clicks, no rubs, no murmurs ABD:  Flat, positive bowel sounds normal in frequency in pitch, no bruits, no rebound, no guarding, no midline pulsatile mass, no hepatomegaly, no splenomegaly EXT:  2 plus pulses throughout, no edema, no cyanosis no clubbing SKIN:  No rashes no nodules NEURO:  Cranial nerves II through XII grossly intact, motor grossly intact throughout PSYCH:  Cognitively intact, oriented to person place and  time   ASSESSMENT/PLAN:    HTN - BP at goal <130/80. Relatively hypotensive with only rare lightheadedness. BP control improved likely related to weight loss, dietary changes - congratulated. Continue present regimen Valsartan -hydrochlorothiazide  320-25mg  daily, Clonidine  0.1mg  BID.  Unclear which dose Coreg  she is taking as picked up 6.25mg  tablets 12/2023 and 25mg  tablets 01/2024. She will let us  know which dose of Coreg  she is taking.  If BP persistently in 110s at follow up with PCP team in August, consider trial off Clonidine .  Consider Spironolactone in future if BP elevated as may allow for reduced dose of potassium supplementation.  Labs: TSH, metanephrines, renin-aldosterone previously ordered, will remind her to have collected. She had to repeat labs as was difficult stick when she went to outside lab location.  GERD - Continue to follow with PCP.   Sinusitis - Evaluated by PCP team 02/01/24. Completed course of Azithromycin . Still with persistent nonproductive cough, frontal sinus congestion, generalized malaise. Some dyspnea at rest which does improve with PRN Albuterol . No adventitious lung sounds on exam. Recommend OTC Guafenesin (avoid -D labeled products). Follow up with PCP team if symptoms persist.   Bradycardia - asymptomatic with no lightheadedness, dizziness. Bradycardia likely related to Carvedilol , Clonidine .   Screening for Secondary Hypertension:     Relevant Labs/Studies:    Latest Ref Rng & Units 12/08/2023    3:51 PM 09/08/2023    4:17 PM 06/20/2023   10:47 AM  Basic Labs  Sodium 135 - 146 mmol/L 139  140  141   Potassium 3.5 - 5.3 mmol/L 4.4  4.0  3.6   Creatinine 0.50 - 1.05 mg/dL 1.61  0.96  0.45        Latest Ref Rng & Units 09/20/2017    8:24 AM 01/11/2016   11:03 AM  Thyroid   TSH 0.35 - 4.50 uIU/mL 3.14  2.47                 06/29/2023    9:44 AM  Renovascular   Renal Artery US  Completed Yes     Disposition:    FU with MD/APP/PharmD in 6  months   Medication Adjustments/Labs and Tests Ordered: Current medicines are reviewed at length with the patient today.  Concerns regarding medicines are outlined above.  No orders of the defined types were placed in this encounter.  No orders of the defined types were placed in this encounter.  Signed, Clearnce Curia, NP  02/08/2024 2:03 PM    [facility]

## 2024-02-08 NOTE — Patient Instructions (Addendum)
 Medication Instructions:  Your physician recommends that you continue on your current medications as directed. Please refer to the Current Medication list given to you today.   Please check which dose of Carvedilol  you are taking at home.  If taking 25 mg, continue 25mg  dose. If taking 6.25mg  dose, continue 6.25mg  dose. If taking both 25mg  and 6.25mg  dose, take just 25mg  dose.    Follow-Up: 6 months (October 2025) with Dr. Theodis Fiscal or Neomi Banks, NP in [facility]  Special Instructions:  Guaifenesin over the counter for chest congestion

## 2024-02-15 ENCOUNTER — Encounter: Payer: Self-pay | Admitting: Physician Assistant

## 2024-02-15 ENCOUNTER — Other Ambulatory Visit

## 2024-02-15 ENCOUNTER — Ambulatory Visit (INDEPENDENT_AMBULATORY_CARE_PROVIDER_SITE_OTHER): Admitting: Physician Assistant

## 2024-02-15 VITALS — BP 120/62 | HR 62 | Ht 64.0 in | Wt 291.0 lb

## 2024-02-15 DIAGNOSIS — D509 Iron deficiency anemia, unspecified: Secondary | ICD-10-CM | POA: Diagnosis not present

## 2024-02-15 DIAGNOSIS — K2960 Other gastritis without bleeding: Secondary | ICD-10-CM

## 2024-02-15 LAB — COMPREHENSIVE METABOLIC PANEL WITH GFR
ALT: 11 U/L (ref 0–35)
AST: 14 U/L (ref 0–37)
Albumin: 4.2 g/dL (ref 3.5–5.2)
Alkaline Phosphatase: 71 U/L (ref 39–117)
BUN: 44 mg/dL — ABNORMAL HIGH (ref 6–23)
CO2: 25 meq/L (ref 19–32)
Calcium: 9.1 mg/dL (ref 8.4–10.5)
Chloride: 103 meq/L (ref 96–112)
Creatinine, Ser: 1.74 mg/dL — ABNORMAL HIGH (ref 0.40–1.20)
GFR: 31.57 mL/min — ABNORMAL LOW (ref >60.00)
Glucose, Bld: 95 mg/dL (ref 70–99)
Potassium: 5 meq/L (ref 3.5–5.1)
Sodium: 139 meq/L (ref 135–145)
Total Bilirubin: 0.3 mg/dL (ref 0.2–1.2)
Total Protein: 8.1 g/dL (ref 6.0–8.3)

## 2024-02-15 LAB — CBC WITH DIFFERENTIAL/PLATELET
Basophils Absolute: 0 10*3/uL (ref 0.0–0.1)
Basophils Relative: 0.5 % (ref 0.0–3.0)
Eosinophils Absolute: 0.3 10*3/uL (ref 0.0–0.7)
Eosinophils Relative: 3.8 % (ref 0.0–5.0)
HCT: 31.8 % — ABNORMAL LOW (ref 36.0–46.0)
Hemoglobin: 10.5 g/dL — ABNORMAL LOW (ref 12.0–15.0)
Lymphocytes Relative: 19.7 % (ref 12.0–46.0)
Lymphs Abs: 1.5 10*3/uL (ref 0.7–4.0)
MCHC: 32.9 g/dL (ref 30.0–36.0)
MCV: 82.3 fl (ref 78.0–100.0)
Monocytes Absolute: 0.6 10*3/uL (ref 0.1–1.0)
Monocytes Relative: 7.5 % (ref 3.0–12.0)
Neutro Abs: 5.1 10*3/uL (ref 1.4–7.7)
Neutrophils Relative %: 68.5 % (ref 43.0–77.0)
Platelets: 350 10*3/uL (ref 150.0–400.0)
RBC: 3.86 Mil/uL — ABNORMAL LOW (ref 3.87–5.11)
RDW: 14 % (ref 11.5–15.5)
WBC: 7.5 10*3/uL (ref 4.0–10.5)

## 2024-02-15 LAB — B12 AND FOLATE PANEL
Folate: 13.9 ng/mL (ref >5.9)
Vitamin B-12: 421 pg/mL (ref 211–911)

## 2024-02-15 LAB — IBC + FERRITIN
Ferritin: 509.5 ng/mL — ABNORMAL HIGH (ref 10.0–291.0)
Iron: 56 ug/dL (ref 42–145)
Saturation Ratios: 23.5 % (ref 20.0–50.0)
TIBC: 238 ug/dL — ABNORMAL LOW (ref 250.0–450.0)
Transferrin: 170 mg/dL — ABNORMAL LOW (ref 212.0–360.0)

## 2024-02-15 MED ORDER — PANTOPRAZOLE SODIUM 40 MG PO TBEC: 40.0000 mg | DELAYED_RELEASE_TABLET | Freq: Every day | ORAL | 11 refills | Status: AC

## 2024-02-15 NOTE — Progress Notes (Signed)
 Chief Complaint: Follow-up hospitalization for erosive gastritis  HPI:    Marissa Bowman is a 58 year old female, known to Dr. Leonia Raman, with a past medical history as listed below, who presents to clinic today after hospitalization in June 2024 with finding of erosive gastritis.    10/12/2021 colonoscopy for iron deficiency anemia with a 5 mm hyperplastic polyp in transverse colon, diverticulosis in the sigmoid colon nonbleeding internal hemorrhoids.    04/11/2023 consult note by our service in this patient with chronic iron deficiency anemia who presented with melena and heme positive stool, hemoglobin noted to be 10.  FOBT positive.    04/12/2023 ED for chronic anemia and heme positive stool with erosive gastritis.  At that point recommended Pantoprazole  40 mg daily indefinitely.    Today, patient presents to clinic accompanied by her mother.  She is here for follow-up after erosive gastritis hospitalization and iron deficiency anemia back in June 2024.  She has not had any labs since she was seen in the hospital.  She feels well.  She ran out of her Pantoprazole  and was not sure if needed to keep taking it.  No GI complaints or concerns.  She still occasionally sees some dark/black stool but does use iron twice daily.    Denies fever, chills or weight loss.  Past Medical History:  Diagnosis Date   Anemia    on iron   Asthma    childhood   Hypertension     Past Surgical History:  Procedure Laterality Date   BIOPSY  10/12/2021   Procedure: BIOPSY;  Surgeon: Sergio Dandy, MD;  Location: [facility];  Service: Endoscopy;;   COLONOSCOPY     COLONOSCOPY WITH PROPOFOL  N/A 02/03/2017   Procedure: COLONOSCOPY WITH PROPOFOL ;  Surgeon: Sergio Dandy, MD;  Location: [facility];  Service: Endoscopy;  Laterality: N/A;   COLONOSCOPY WITH PROPOFOL  N/A 10/12/2021   Procedure: COLONOSCOPY WITH PROPOFOL ;  Surgeon: Sergio Dandy, MD;  Location: [facility];  Service: Endoscopy;   Laterality: N/A;   ESOPHAGOGASTRODUODENOSCOPY N/A 04/12/2023   Procedure: ESOPHAGOGASTRODUODENOSCOPY (EGD);  Surgeon: Tobin Forts, MD;  Location: [facility];  Service: Gastroenterology;  Laterality: N/A;   ESOPHAGOGASTRODUODENOSCOPY (EGD) WITH PROPOFOL  N/A 10/12/2021   Procedure: ESOPHAGOGASTRODUODENOSCOPY (EGD) WITH PROPOFOL ;  Surgeon: Sergio Dandy, MD;  Location: [facility];  Service: Endoscopy;  Laterality: N/A;   NO PAST SURGERIES     POLYPECTOMY  10/12/2021   Procedure: POLYPECTOMY;  Surgeon: Sergio Dandy, MD;  Location: [facility];  Service: Endoscopy;;    Current Outpatient Medications  Medication Sig Dispense Refill   albuterol  (VENTOLIN  HFA) 108 (90 Base) MCG/ACT inhaler Inhale 2 puffs into the lungs every 6 (six) hours as needed for wheezing or shortness of breath. 18 g 0   benzonatate  (TESSALON ) 100 MG capsule Take 1 capsule (100 mg total) by mouth 3 (three) times daily as needed for cough. 30 capsule 0   carvedilol  (COREG ) 25 MG tablet TAKE 1 TABLET TWICE DAILY  WITH MEALS 180 tablet 1   cloNIDine  (CATAPRES ) 0.1 MG tablet TAKE ONE TABLET BY MOUTH TWICE A DAY 180 tablet 1   ferrous sulfate  325 (65 FE) MG tablet Take 1 tablet (325 mg total) by mouth 2 (two) times daily with a meal. 60 tablet 2   fluticasone  (FLONASE ) 50 MCG/ACT nasal spray Place 2 sprays into both nostrils daily. 16 g 1   furosemide  (LASIX ) 20 MG tablet Take 1 tablet by mouth once daily as needed for swelling (Patient  not taking: Reported on 02/08/2024) 30 tablet 0   levocetirizine (XYZAL ) 5 MG tablet Take 1 tablet (5 mg total) by mouth every evening. 30 tablet 1   pantoprazole  (PROTONIX ) 40 MG tablet Take 1 tablet (40 mg total) by mouth daily. 90 tablet 1   potassium chloride  (KLOR-CON  M) 10 MEQ tablet Take 1 tablet (10 mEq total) by mouth 2 (two) times daily. 180 tablet 0   valsartan -hydrochlorothiazide  (DIOVAN -HCT) 320-25 MG tablet Take 1 tablet by mouth daily. 90 tablet 3   No current  facility-administered medications for this visit.    Allergies as of 02/15/2024 - Review Complete 02/08/2024  Allergen Reaction Noted   Latex Rash 11/08/2016    Family History  Problem Relation Age of Onset   Hypertension Mother    Hyperlipidemia Mother    Diabetes Mother    Lung cancer Father    Thyroid disease Sister    Fibromyalgia Sister    Colon cancer Neg Hx    BRCA 1/2 Neg Hx    Breast cancer Neg Hx     Social History   Socioeconomic History   Marital status: Single    Spouse name: Not on file   Number of children: Not on file   Years of education: Not on file   Highest education level: Associate degree: occupational, Scientist, product/process development, or vocational program  Occupational History   Not on file  Tobacco Use   Smoking status: Never   Smokeless tobacco: Never  Vaping Use   Vaping status: Never Used  Substance and Sexual Activity   Alcohol use: No   Drug use: No   Sexual activity: Not Currently    Birth control/protection: None  Other Topics Concern   Not on file  Social History Narrative   1 son- age 28   Unemployed, plans to sub with [employer]   Associated degree   Lives with mother Engineer, materials and son   Has 1 dog lab/spaniel mix   Enjoys reading   Social Drivers of Health   Financial Resource Strain: Low Risk  (12/01/2023)   Overall Financial Resource Strain (CARDIA)    Difficulty of Paying Living Expenses: Not very hard  Food Insecurity: No Food Insecurity (12/01/2023)   Hunger Vital Sign    Worried About Running Out of Food in the Last Year: Never true    Ran Out of Food in the Last Year: Never true  Transportation Needs: No Transportation Needs (12/01/2023)   PRAPARE - Administrator, Civil Service (Medical): No    Lack of Transportation (Non-Medical): No  Physical Activity: Insufficiently Active (12/01/2023)   Exercise Vital Sign    Days of Exercise per Week: 3 days    Minutes of Exercise per Session: 30 min  Stress: No Stress  Concern Present (12/01/2023)   [employer] of Occupational Health - Occupational Stress Questionnaire    Feeling of Stress : Not at all  Social Connections: Moderately Integrated (12/01/2023)   Social Connection and Isolation Panel [NHANES]    Frequency of Communication with Friends and Family: Twice a week    Frequency of Social Gatherings with Friends and Family: Once a week    Attends Religious Services: 1 to 4 times per year    Active Member of [employer] or Organizations: Yes    Attends Banker Meetings: 1 to 4 times per year    Marital Status: Never married  Intimate Partner Violence: Not At Risk (04/10/2023)   Humiliation, Afraid, Rape, and Kick  questionnaire    Fear of Current or Ex-Partner: No    Emotionally Abused: No    Physically Abused: No    Sexually Abused: No    Review of Systems:    Constitutional: No weight loss, fever or chills Skin: No rash  Cardiovascular: No chest pain  Respiratory: No SOB  Gastrointestinal: See HPI and otherwise negative Genitourinary: No dysuria  Neurological: No headache, dizziness or syncope Musculoskeletal: No new muscle or joint pain Hematologic: No bleeding  Psychiatric: No history of depression or anxiety   Physical Exam:  Vital signs: BP 120/62   Pulse 62   Ht 5\' 4"  (1.626 m)   Wt 291 lb (132 kg)   LMP 01/27/2017   BMI 49.95 kg/m    Constitutional:   Pleasant obese AA female appears to be in NAD, Well developed, Well nourished, alert and cooperative Respiratory: Respirations even and unlabored. Lungs clear to auscultation bilaterally.   No wheezes, crackles, or rhonchi.  Cardiovascular: Normal S1, S2. No MRG. Regular rate and rhythm. No peripheral edema, cyanosis or pallor.  Gastrointestinal:  Soft, nondistended, nontender. No rebound or guarding. Normal bowel sounds. No appreciable masses or hepatomegaly. Psychiatric: Demonstrates good judgement and reason without abnormal affect or behaviors.  RELEVANT LABS  AND IMAGING: CBC    Component Value Date/Time   WBC 6.3 12/08/2023 1551   RBC 3.86 12/08/2023 1551   HGB 10.0 (L) 12/08/2023 1551   HCT 31.8 (L) 12/08/2023 1551   PLT 284 12/08/2023 1551   MCV 82.4 12/08/2023 1551   MCH 25.9 (L) 12/08/2023 1551   MCHC 31.4 (L) 12/08/2023 1551   RDW 14.9 12/08/2023 1551   LYMPHSABS 1.0 04/21/2023 1344   MONOABS 0.3 04/21/2023 1344   EOSABS 189 12/08/2023 1551   BASOSABS 19 12/08/2023 1551    CMP     Component Value Date/Time   NA 139 12/08/2023 1551   K 4.4 12/08/2023 1551   CL 100 12/08/2023 1551   CO2 27 12/08/2023 1551   GLUCOSE 84 12/08/2023 1551   BUN 27 (H) 12/08/2023 1551   CREATININE 1.07 (H) 12/08/2023 1551   CALCIUM 9.0 12/08/2023 1551   PROT 7.4 04/28/2023 1817   ALBUMIN 3.3 (L) 04/28/2023 1817   AST 13 (L) 04/28/2023 1817   ALT 15 04/28/2023 1817   ALKPHOS 63 04/28/2023 1817   BILITOT 0.5 04/28/2023 1817   GFRNONAA 55 (L) 04/28/2023 1817   GFRAA >60 01/28/2020 1533    Assessment: 1.  Iron deficiency anemia: Chronic for the patient, currently on iron twice daily, no recheck of labs recently 2.  Erosive gastritis: Seen at time of last EGD in June 2024, patient has been off of her Pantoprazole   Plan: 1.  Recheck labs today including CBC, CMP, iron studies, B12 and folate 2.  Restart Pantoprazole  40 mg daily.  #30 with 11 refills. 3.  Patient to follow in clinic per recommendations after labs above.  Reginal Capra, PA-C [facility] 02/15/2024, 2:37 PM  Cc: O'Sullivan, Melissa, NP

## 2024-02-15 NOTE — Patient Instructions (Signed)
 Your provider has requested that you go to the basement level for lab work before leaving today. Press "B" on the elevator. The lab is located at the first door on the left as you exit the elevator.  We have sent the following medications to your pharmacy for you to pick up at your convenience: Pantoprazole  40 mg twice daily 30-60 minutes before breakfast and dinner.   Continue Iron twice daily.   _______________________________________________________  If your blood pressure at your visit was 140/90 or greater, please contact your primary care physician to follow up on this.  _______________________________________________________  If you are age 23 or older, your body mass index should be between 23-30. Your Body mass index is 49.95 kg/m. If this is out of the aforementioned range listed, please consider follow up with your Primary Care Provider.  If you are age 92 or younger, your body mass index should be between 19-25. Your Body mass index is 49.95 kg/m. If this is out of the aformentioned range listed, please consider follow up with your Primary Care Provider.   ________________________________________________________  The [facility] providers would like to encourage you to use MYCHART to communicate with providers for non-urgent requests or questions.  Due to long hold times on the telephone, sending your provider a message by [facility] may be a faster and more efficient way to get a response.  Please allow 48 business hours for a response.  Please remember that this is for non-urgent requests.  _______________________________________________________

## 2024-02-20 ENCOUNTER — Telehealth: Payer: Self-pay | Admitting: Family

## 2024-02-20 DIAGNOSIS — N289 Disorder of kidney and ureter, unspecified: Secondary | ICD-10-CM

## 2024-02-20 NOTE — Telephone Encounter (Signed)
 Kidney function has worsened per GI labs.  Has she been taking any anti-inflammatories?  If so, please stop. How often has she been taking furosemide ?  Increase hydration and repeat bmet in 1 week.

## 2024-02-20 NOTE — Telephone Encounter (Signed)
 Called patient but no answer, left voice mail for patient to call back.

## 2024-02-21 ENCOUNTER — Telehealth: Payer: Self-pay

## 2024-02-21 NOTE — Telephone Encounter (Signed)
 Patient is needing  a call back regarding a call from the office  there was no notes left on account

## 2024-02-21 NOTE — Telephone Encounter (Signed)
 Patient notified of results,  provider comments and recommendations. Patient reports she is not taking furosemide . She was advise to increase fluid intake and to report any swelling. She was scheduled for BMET in one week.

## 2024-02-21 NOTE — Telephone Encounter (Signed)
Talked to patient today

## 2024-02-21 NOTE — Telephone Encounter (Signed)
 Copied from CRM [id number]. Topic: General - Call Back - No Documentation >> Feb 21, 2024  9:32 AM Everlene Hobby D wrote: Patient says she missed a call from [facility] yesterday and today

## 2024-02-24 ENCOUNTER — Other Ambulatory Visit: Payer: Self-pay | Admitting: Family

## 2024-02-24 DIAGNOSIS — I10 Essential (primary) hypertension: Secondary | ICD-10-CM

## 2024-02-28 ENCOUNTER — Other Ambulatory Visit (INDEPENDENT_AMBULATORY_CARE_PROVIDER_SITE_OTHER)

## 2024-02-28 ENCOUNTER — Other Ambulatory Visit

## 2024-02-28 DIAGNOSIS — N289 Disorder of kidney and ureter, unspecified: Secondary | ICD-10-CM

## 2024-02-29 ENCOUNTER — Telehealth: Payer: Self-pay | Admitting: Family

## 2024-02-29 DIAGNOSIS — N1832 Chronic kidney disease, stage 3b: Secondary | ICD-10-CM

## 2024-02-29 LAB — BASIC METABOLIC PANEL WITH GFR
BUN: 40 mg/dL — ABNORMAL HIGH (ref 6–23)
CO2: 24 meq/L (ref 19–32)
Calcium: 8.7 mg/dL (ref 8.4–10.5)
Chloride: 102 meq/L (ref 96–112)
Creatinine, Ser: 1.49 mg/dL — ABNORMAL HIGH (ref 0.40–1.20)
GFR: 38.02 mL/min — ABNORMAL LOW (ref >60.00)
Glucose, Bld: 96 mg/dL (ref 70–99)
Potassium: 4.2 meq/L (ref 3.5–5.1)
Sodium: 137 meq/L (ref 135–145)

## 2024-02-29 NOTE — Telephone Encounter (Signed)
Spoke w/ Pt- informed of results and recommendations. Pt verbalized understanding.  

## 2024-02-29 NOTE — Telephone Encounter (Signed)
 Kidney function has improved since last check but is still reduced.  I would like to refer her to a kidney specialist.

## 2024-03-05 ENCOUNTER — Other Ambulatory Visit: Payer: Self-pay | Admitting: Family

## 2024-03-05 DIAGNOSIS — I10 Essential (primary) hypertension: Secondary | ICD-10-CM

## 2024-05-31 ENCOUNTER — Other Ambulatory Visit: Payer: Self-pay | Admitting: Family

## 2024-05-31 ENCOUNTER — Encounter: Payer: Self-pay | Admitting: Family

## 2024-05-31 DIAGNOSIS — I10 Essential (primary) hypertension: Secondary | ICD-10-CM

## 2024-05-31 NOTE — Telephone Encounter (Signed)
 Called patient to offer an appointment, she complains of some chills. She prefers to go to [facility]

## 2024-06-07 ENCOUNTER — Ambulatory Visit (INDEPENDENT_AMBULATORY_CARE_PROVIDER_SITE_OTHER): Payer: 59 | Admitting: Family

## 2024-06-07 ENCOUNTER — Encounter: Payer: Self-pay | Admitting: Family

## 2024-06-07 VITALS — BP 139/63 | HR 60 | Temp 98.9°F | Resp 16 | Ht 64.0 in | Wt 298.0 lb

## 2024-06-07 DIAGNOSIS — E876 Hypokalemia: Secondary | ICD-10-CM

## 2024-06-07 DIAGNOSIS — D509 Iron deficiency anemia, unspecified: Secondary | ICD-10-CM | POA: Diagnosis not present

## 2024-06-07 DIAGNOSIS — Z23 Encounter for immunization: Secondary | ICD-10-CM | POA: Diagnosis not present

## 2024-06-07 DIAGNOSIS — I10 Essential (primary) hypertension: Secondary | ICD-10-CM

## 2024-06-07 DIAGNOSIS — E785 Hyperlipidemia, unspecified: Secondary | ICD-10-CM

## 2024-06-07 DIAGNOSIS — I1A0 Resistant hypertension: Secondary | ICD-10-CM

## 2024-06-07 DIAGNOSIS — M722 Plantar fascial fibromatosis: Secondary | ICD-10-CM | POA: Diagnosis not present

## 2024-06-07 DIAGNOSIS — K219 Gastro-esophageal reflux disease without esophagitis: Secondary | ICD-10-CM

## 2024-06-07 DIAGNOSIS — E538 Deficiency of other specified B group vitamins: Secondary | ICD-10-CM

## 2024-06-07 DIAGNOSIS — G5602 Carpal tunnel syndrome, left upper limb: Secondary | ICD-10-CM

## 2024-06-07 DIAGNOSIS — J309 Allergic rhinitis, unspecified: Secondary | ICD-10-CM

## 2024-06-07 MED ORDER — CARVEDILOL 25 MG PO TABS: 25.0000 mg | ORAL_TABLET | Freq: Two times a day (BID) | ORAL | 1 refills | Status: AC

## 2024-06-07 MED ORDER — LEVOCETIRIZINE DIHYDROCHLORIDE 5 MG PO TABS: 5.0000 mg | ORAL_TABLET | Freq: Every evening | ORAL | 5 refills | Status: AC

## 2024-06-07 MED ORDER — POTASSIUM CHLORIDE CRYS ER 10 MEQ PO TBCR
10.0000 meq | EXTENDED_RELEASE_TABLET | Freq: Two times a day (BID) | ORAL | 5 refills | Status: DC
Start: 2024-06-07 — End: 2024-07-29

## 2024-06-07 MED ORDER — CLONIDINE HCL 0.1 MG PO TABS: 0.1000 mg | ORAL_TABLET | Freq: Two times a day (BID) | ORAL | 0 refills | Status: DC

## 2024-06-07 NOTE — Assessment & Plan Note (Signed)
 Lab Results  Component Value Date   WBC 7.5 02/15/2024   HGB 10.5 (L) 02/15/2024   HCT 31.8 (L) 02/15/2024   MCV 82.3 02/15/2024   PLT 350.0 02/15/2024

## 2024-06-07 NOTE — Assessment & Plan Note (Signed)
 Stable  monitor

## 2024-06-07 NOTE — Assessment & Plan Note (Signed)
 BP Readings from Last 3 Encounters:  06/07/24 139/63  02/15/24 120/62  02/08/24 116/70   At goal on carvedilol , clonidine  and diovan  HCT. Continue same.

## 2024-06-07 NOTE — Assessment & Plan Note (Signed)
 Not on supplement. Update b12 level.

## 2024-06-07 NOTE — Progress Notes (Signed)
 Subjective:     Patient ID: Marissa Bowman, female    DOB: 09-09-1966, 58 y.o.   MRN: [id number]  Chief Complaint  Patient presents with   Hypertension    Here for follow up    HPI  Discussed the use of AI scribe software for clinical note transcription with the patient, who gave verbal consent to proceed.  History of Present Illness   Marissa Bowman is a 58 year old female who presents for a routine follow-up.  Hypertension is well-controlled with carvedilol , clonidine , and Diovan . She experiences no shortness of breath and does not use Lasix  for swelling.  Plantar fasciitis causes persistent foot pain, attributed to prolonged standing. Leg swelling has improved and no longer causes skin issues.  GERD is managed with pantoprazole . Missing a dose results in nausea and a bad taste in her mouth.  Carpal tunnel syndrome, treated a year ago, has improved. One hand feels slow but is pain-free.  Anemia is stable with twice-daily iron supplements. B12 levels were normal at the last check.  Xyzal  effectively manages allergies, particularly a persistent runny nose. She does not use Flonase  and occasionally uses albuterol  when sick.      Health Maintenance Due  Topic Date Due   INFLUENZA VACCINE  05/17/2024    Past Medical History:  Diagnosis Date   Acute gastric erosion 04/12/2023   Anemia    on iron   Asthma    childhood   Carpal tunnel syndrome of left wrist 12/11/2022   Heme positive stool 03/10/2021   Hypertension    Melena 04/10/2023    Past Surgical History:  Procedure Laterality Date   BIOPSY  10/12/2021   Procedure: BIOPSY;  Surgeon: Shila Gustav GAILS, MD;  Location: [facility];  Service: Endoscopy;;   COLONOSCOPY     COLONOSCOPY WITH PROPOFOL  N/A 02/03/2017   Procedure: COLONOSCOPY WITH PROPOFOL ;  Surgeon: Gustav Shila GAILS, MD;  Location: [facility];  Service: Endoscopy;  Laterality: N/A;   COLONOSCOPY WITH PROPOFOL  N/A 10/12/2021   Procedure:  COLONOSCOPY WITH PROPOFOL ;  Surgeon: Shila Gustav GAILS, MD;  Location: [facility];  Service: Endoscopy;  Laterality: N/A;   ESOPHAGOGASTRODUODENOSCOPY N/A 04/12/2023   Procedure: ESOPHAGOGASTRODUODENOSCOPY (EGD);  Surgeon: Abran Norleen SAILOR, MD;  Location: [facility];  Service: Gastroenterology;  Laterality: N/A;   ESOPHAGOGASTRODUODENOSCOPY (EGD) WITH PROPOFOL  N/A 10/12/2021   Procedure: ESOPHAGOGASTRODUODENOSCOPY (EGD) WITH PROPOFOL ;  Surgeon: Shila Gustav GAILS, MD;  Location: [facility];  Service: Endoscopy;  Laterality: N/A;   NO PAST SURGERIES     POLYPECTOMY  10/12/2021   Procedure: POLYPECTOMY;  Surgeon: Shila Gustav GAILS, MD;  Location: [facility];  Service: Endoscopy;;    Family History  Problem Relation Age of Onset   Hypertension Mother    Hyperlipidemia Mother    Diabetes Mother    Lung cancer Father    Thyroid disease Sister    Fibromyalgia Sister    Colon cancer Neg Hx    BRCA 1/2 Neg Hx    Breast cancer Neg Hx     Social History   Socioeconomic History   Marital status: Single    Spouse name: Not on file   Number of children: Not on file   Years of education: Not on file   Highest education level: Associate degree: occupational, Scientist, product/process development, or vocational program  Occupational History   Not on file  Tobacco Use   Smoking status: Never   Smokeless tobacco: Never  Vaping Use   Vaping status: Never Used  Substance and Sexual Activity   Alcohol use: No   Drug use: No   Sexual activity: Not Currently    Birth control/protection: None  Other Topics Concern   Not on file  Social History Narrative   1 son- age 63   Unemployed, plans to sub with [employer]   Associated degree   Lives with mother father/father and son   Has 1 dog lab/spaniel mix   Enjoys reading   Social Drivers of Health   Financial Resource Strain: Low Risk  (12/01/2023)   Overall Financial Resource Strain (CARDIA)    Difficulty of Paying Living Expenses: Not very hard  Food  Insecurity: No Food Insecurity (12/01/2023)   Hunger Vital Sign    Worried About Running Out of Food in the Last Year: Never true    Ran Out of Food in the Last Year: Never true  Transportation Needs: No Transportation Needs (12/01/2023)   PRAPARE - Administrator, Civil Service (Medical): No    Lack of Transportation (Non-Medical): No  Physical Activity: Insufficiently Active (12/01/2023)   Exercise Vital Sign    Days of Exercise per Week: 3 days    Minutes of Exercise per Session: 30 min  Stress: No Stress Concern Present (12/01/2023)   [employer] of Occupational Health - Occupational Stress Questionnaire    Feeling of Stress : Not at all  Social Connections: Moderately Integrated (12/01/2023)   Social Connection and Isolation Panel    Frequency of Communication with Friends and Family: Twice a week    Frequency of Social Gatherings with Friends and Family: Once a week    Attends Religious Services: 1 to 4 times per year    Active Member of [employer] or Organizations: Yes    Attends Banker Meetings: 1 to 4 times per year    Marital Status: Never married  Intimate Partner Violence: Not At Risk (04/10/2023)   Humiliation, Afraid, Rape, and Kick questionnaire    Fear of Current or Ex-Partner: No    Emotionally Abused: No    Physically Abused: No    Sexually Abused: No    Outpatient Medications Prior to Visit  Medication Sig Dispense Refill   ferrous sulfate  325 (65 FE) MG tablet Take 1 tablet (325 mg total) by mouth 2 (two) times daily with a meal. 60 tablet 2   pantoprazole  (PROTONIX ) 40 MG tablet Take 1 tablet (40 mg total) by mouth daily. 30 tablet 11   valsartan -hydrochlorothiazide  (DIOVAN -HCT) 320-25 MG tablet Take 1 tablet by mouth daily. 90 tablet 3   albuterol  (VENTOLIN  HFA) 108 (90 Base) MCG/ACT inhaler Inhale 2 puffs into the lungs every 6 (six) hours as needed for wheezing or shortness of breath. 18 g 0   carvedilol  (COREG ) 25 MG tablet TAKE 1  TABLET TWICE DAILY  WITH MEALS 180 tablet 1   cloNIDine  (CATAPRES ) 0.1 MG tablet TAKE ONE TABLET BY MOUTH TWICE A DAY 180 tablet 0   fluticasone  (FLONASE ) 50 MCG/ACT nasal spray Place 2 sprays into both nostrils daily. 16 g 1   furosemide  (LASIX ) 20 MG tablet Take 1 tablet by mouth once daily as needed for swelling 30 tablet 0   levocetirizine (XYZAL ) 5 MG tablet Take 1 tablet (5 mg total) by mouth every evening. 30 tablet 1   potassium chloride  (KLOR-CON  M) 10 MEQ tablet TAKE ONE TABLET BY MOUTH TWICE A DAY 180 tablet 0   benzonatate  (TESSALON ) 100 MG capsule Take 1 capsule (100 mg total)  by mouth 3 (three) times daily as needed for cough. 30 capsule 0   No facility-administered medications prior to visit.    Allergies  Allergen Reactions   Latex Rash    Latex gloves with powder cause a rash    ROS See HPI    Objective:    Physical Exam Constitutional:      General: She is not in acute distress.    Appearance: Normal appearance. She is well-developed.  HENT:     Head: Normocephalic and atraumatic.     Right Ear: External ear normal.     Left Ear: External ear normal.  Eyes:     General: No scleral icterus. Neck:     Thyroid: No thyromegaly.  Cardiovascular:     Rate and Rhythm: Normal rate and regular rhythm.     Heart sounds: Normal heart sounds. No murmur heard. Pulmonary:     Effort: Pulmonary effort is normal. No respiratory distress.     Breath sounds: Normal breath sounds. No wheezing.  Musculoskeletal:     Cervical back: Neck supple.     Right lower leg: 2+ Edema present.     Left lower leg: 2+ Edema present.  Skin:    General: Skin is warm and dry.  Neurological:     Mental Status: She is alert and oriented to person, place, and time.  Psychiatric:        Mood and Affect: Mood normal.        Behavior: Behavior normal.        Thought Content: Thought content normal.        Judgment: Judgment normal.      BP 139/63 (BP Location: Right Arm, Patient  Position: Sitting, Cuff Size: Large)   Pulse 60   Temp 98.9 F (37.2 C) (Oral)   Resp 16   Ht 5' 4 (1.626 m)   Wt 298 lb (135.2 kg)   LMP 01/27/2017   SpO2 100%   BMI 51.15 kg/m  Wt Readings from Last 3 Encounters:  06/07/24 298 lb (135.2 kg)  02/15/24 291 lb (132 kg)  02/08/24 285 lb (129.3 kg)       Assessment & Plan:   Problem List Items Addressed This Visit       Unprioritized   Plantar fasciitis of right foot   Stable- monitor.       HTN (hypertension) - Primary   BP Readings from Last 3 Encounters:  06/07/24 139/63  02/15/24 120/62  02/08/24 116/70   At goal on carvedilol , clonidine  and diovan  HCT. Continue same.      Relevant Medications   potassium chloride  (KLOR-CON  M) 10 MEQ tablet   cloNIDine  (CATAPRES ) 0.1 MG tablet   carvedilol  (COREG ) 25 MG tablet   Other Relevant Orders   Basic Metabolic Panel (BMET)   GERD (gastroesophageal reflux disease)   Stable on pantoprazole .  Has not tolerated skipped doses.       RESOLVED: Carpal tunnel syndrome of left wrist   Clinically resolved.       B12 deficiency   Not on supplement. Update b12 level.      Anemia, iron deficiency   Lab Results  Component Value Date   WBC 7.5 02/15/2024   HGB 10.5 (L) 02/15/2024   HCT 31.8 (L) 02/15/2024   MCV 82.3 02/15/2024   PLT 350.0 02/15/2024         Relevant Orders   CBC w/Diff   Allergic rhinitis   Stable on xyzal .  Relevant Medications   levocetirizine (XYZAL ) 5 MG tablet   Other Visit Diagnoses       Hyperlipidemia, unspecified hyperlipidemia type       Relevant Medications   cloNIDine  (CATAPRES ) 0.1 MG tablet   carvedilol  (COREG ) 25 MG tablet   Other Relevant Orders   Lipid panel     Hypokalemia       Relevant Medications   cloNIDine  (CATAPRES ) 0.1 MG tablet     Need for pneumococcal 20-valent conjugate vaccination       Relevant Orders   Pneumococcal conjugate vaccine 20-valent (Prevnar 20) (Completed)       I have discontinued  Shalandra D. Holtry's furosemide , fluticasone , benzonatate , and albuterol . I have also changed her potassium chloride , cloNIDine , and carvedilol . Additionally, I am having her maintain her ferrous sulfate , valsartan -hydrochlorothiazide , pantoprazole , and levocetirizine.  Meds ordered this encounter  Medications   potassium chloride  (KLOR-CON  M) 10 MEQ tablet    Sig: Take 1 tablet (10 mEq total) by mouth 2 (two) times daily.    Dispense:  60 tablet    Refill:  5    Supervising Provider:   DOMENICA BLACKBIRD A [[id number]]   cloNIDine  (CATAPRES ) 0.1 MG tablet    Sig: Take 1 tablet (0.1 mg total) by mouth 2 (two) times daily.    Dispense:  180 tablet    Refill:  0    Supervising Provider:   DOMENICA BLACKBIRD A [[id number]]   levocetirizine (XYZAL ) 5 MG tablet    Sig: Take 1 tablet (5 mg total) by mouth every evening.    Dispense:  30 tablet    Refill:  5    Supervising Provider:   DOMENICA BLACKBIRD A [[id number]]   carvedilol  (COREG ) 25 MG tablet    Sig: Take 1 tablet (25 mg total) by mouth 2 (two) times daily with a meal.    Dispense:  180 tablet    Refill:  1    Supervising Provider:   DOMENICA BLACKBIRD A [[id number]]

## 2024-06-07 NOTE — Patient Instructions (Signed)
 VISIT SUMMARY:  Today, we reviewed your ongoing health conditions and confirmed that your hypertension, GERD, and anemia are well-managed with your current medications. We also discussed your plantar fasciitis and allergies, ensuring you have the right treatments in place.  YOUR PLAN:  PLANTAR FASCIITIS: Chronic foot pain due to plantar fasciitis, which is worsened by prolonged standing. -Continue managing your plantar fasciitis as you have been.  HYPERTENSION: Your blood pressure is well-controlled with your current medications. -Continue taking carvedilol , clonidine , and Diovan  as prescribed.  GASTROESOPHAGEAL REFLUX DISEASE (GERD): Your GERD symptoms are well-controlled with pantoprazole , but symptoms worsen if you miss a dose. -Continue taking pantoprazole  as prescribed.  IRON DEFICIENCY ANEMIA: Your mild anemia is stable with iron supplements. -Continue taking iron supplements twice daily. -Monitor your blood count as needed.  ALLERGIC RHINITIS: Chronic runny nose due to allergies, well-managed with Xyzal . -Continue taking Xyzal  for your allergies.

## 2024-06-07 NOTE — Assessment & Plan Note (Signed)
Clinically resolved.  

## 2024-06-07 NOTE — Assessment & Plan Note (Signed)
 Stable on pantoprazole .  Has not tolerated skipped doses.

## 2024-06-07 NOTE — Assessment & Plan Note (Signed)
 Stable on xyzal .

## 2024-06-21 ENCOUNTER — Encounter: Payer: Self-pay | Admitting: Family

## 2024-07-29 ENCOUNTER — Other Ambulatory Visit: Payer: Self-pay | Admitting: Family

## 2024-07-29 DIAGNOSIS — I10 Essential (primary) hypertension: Secondary | ICD-10-CM

## 2024-07-30 ENCOUNTER — Encounter: Payer: Self-pay | Admitting: Family

## 2024-07-30 NOTE — Telephone Encounter (Signed)
 Please advise on location change

## 2024-08-02 ENCOUNTER — Telehealth: Payer: Self-pay

## 2024-08-02 DIAGNOSIS — N1832 Chronic kidney disease, stage 3b: Secondary | ICD-10-CM

## 2024-08-02 NOTE — Addendum Note (Signed)
 Addended by: DARYL SETTER on: 08/02/2024 01:26 PM   Modules accepted: Orders

## 2024-08-02 NOTE — Telephone Encounter (Signed)
 Patient has been referred to nephrology but will like to be seen in [place]. Can we refer to Dr. In [place]?

## 2024-08-25 ENCOUNTER — Encounter (INDEPENDENT_AMBULATORY_CARE_PROVIDER_SITE_OTHER): Payer: Self-pay | Admitting: Family

## 2024-08-25 DIAGNOSIS — M109 Gout, unspecified: Secondary | ICD-10-CM

## 2024-08-26 DIAGNOSIS — M109 Gout, unspecified: Secondary | ICD-10-CM | POA: Diagnosis not present

## 2024-08-26 MED ORDER — COLCHICINE 0.6 MG PO TABS: ORAL_TABLET | ORAL | 0 refills | Status: AC

## 2024-08-26 NOTE — Telephone Encounter (Signed)

## 2024-08-28 ENCOUNTER — Other Ambulatory Visit: Payer: Self-pay | Admitting: Family

## 2024-08-28 DIAGNOSIS — E876 Hypokalemia: Secondary | ICD-10-CM

## 2024-12-10 ENCOUNTER — Ambulatory Visit: Admitting: Family
